# Patient Record
Sex: Male | Born: 1950 | Race: White | Hispanic: No | Marital: Married | Smoking: Former smoker
Health system: Southern US, Community
[De-identification: ages and names within clinical notes are randomized; demographics above are authoritative.]

## PROBLEM LIST (undated history)

## (undated) DIAGNOSIS — E119 Type 2 diabetes mellitus without complications: Secondary | ICD-10-CM

## (undated) DIAGNOSIS — F0390 Unspecified dementia without behavioral disturbance: Secondary | ICD-10-CM

## (undated) DIAGNOSIS — I10 Essential (primary) hypertension: Secondary | ICD-10-CM

## (undated) HISTORY — PX: CATARACT EXTRACTION: SUR2

## (undated) HISTORY — PX: ROTATOR CUFF REPAIR: SHX139

## (undated) HISTORY — PX: COLONOSCOPY: SHX174

## (undated) HISTORY — DX: Type 2 diabetes mellitus without complications: E11.9

## (undated) HISTORY — PX: EYE SURGERY: SHX253

## (undated) HISTORY — DX: Essential (primary) hypertension: I10

## (undated) HISTORY — PX: HEMORROIDECTOMY: SUR656

---

## 2013-03-17 DIAGNOSIS — G4700 Insomnia, unspecified: Secondary | ICD-10-CM | POA: Insufficient documentation

## 2013-05-23 DIAGNOSIS — M1990 Unspecified osteoarthritis, unspecified site: Secondary | ICD-10-CM | POA: Insufficient documentation

## 2013-12-03 DIAGNOSIS — E785 Hyperlipidemia, unspecified: Secondary | ICD-10-CM | POA: Insufficient documentation

## 2013-12-03 DIAGNOSIS — I10 Essential (primary) hypertension: Secondary | ICD-10-CM | POA: Insufficient documentation

## 2013-12-03 DIAGNOSIS — E10649 Type 1 diabetes mellitus with hypoglycemia without coma: Secondary | ICD-10-CM | POA: Insufficient documentation

## 2014-11-06 DIAGNOSIS — E1142 Type 2 diabetes mellitus with diabetic polyneuropathy: Secondary | ICD-10-CM | POA: Insufficient documentation

## 2014-11-06 DIAGNOSIS — R419 Unspecified symptoms and signs involving cognitive functions and awareness: Secondary | ICD-10-CM | POA: Insufficient documentation

## 2015-11-12 DIAGNOSIS — F5101 Primary insomnia: Secondary | ICD-10-CM | POA: Diagnosis not present

## 2015-11-12 DIAGNOSIS — G4733 Obstructive sleep apnea (adult) (pediatric): Secondary | ICD-10-CM | POA: Diagnosis not present

## 2015-11-12 DIAGNOSIS — Z6834 Body mass index (BMI) 34.0-34.9, adult: Secondary | ICD-10-CM | POA: Diagnosis not present

## 2015-11-12 DIAGNOSIS — R419 Unspecified symptoms and signs involving cognitive functions and awareness: Secondary | ICD-10-CM | POA: Diagnosis not present

## 2015-11-12 DIAGNOSIS — E1142 Type 2 diabetes mellitus with diabetic polyneuropathy: Secondary | ICD-10-CM | POA: Diagnosis not present

## 2015-12-22 DIAGNOSIS — E1142 Type 2 diabetes mellitus with diabetic polyneuropathy: Secondary | ICD-10-CM | POA: Diagnosis not present

## 2015-12-22 DIAGNOSIS — I10 Essential (primary) hypertension: Secondary | ICD-10-CM | POA: Diagnosis not present

## 2015-12-22 DIAGNOSIS — E785 Hyperlipidemia, unspecified: Secondary | ICD-10-CM | POA: Diagnosis not present

## 2015-12-22 DIAGNOSIS — E109 Type 1 diabetes mellitus without complications: Secondary | ICD-10-CM | POA: Diagnosis not present

## 2015-12-22 DIAGNOSIS — Z6833 Body mass index (BMI) 33.0-33.9, adult: Secondary | ICD-10-CM | POA: Diagnosis not present

## 2015-12-23 DIAGNOSIS — E785 Hyperlipidemia, unspecified: Secondary | ICD-10-CM | POA: Diagnosis not present

## 2015-12-23 DIAGNOSIS — I10 Essential (primary) hypertension: Secondary | ICD-10-CM | POA: Diagnosis not present

## 2015-12-23 DIAGNOSIS — E109 Type 1 diabetes mellitus without complications: Secondary | ICD-10-CM | POA: Diagnosis not present

## 2016-01-20 DIAGNOSIS — J4520 Mild intermittent asthma, uncomplicated: Secondary | ICD-10-CM | POA: Diagnosis not present

## 2016-01-20 DIAGNOSIS — Z6833 Body mass index (BMI) 33.0-33.9, adult: Secondary | ICD-10-CM | POA: Diagnosis not present

## 2016-01-20 DIAGNOSIS — J301 Allergic rhinitis due to pollen: Secondary | ICD-10-CM | POA: Insufficient documentation

## 2016-02-01 DIAGNOSIS — E161 Other hypoglycemia: Secondary | ICD-10-CM | POA: Diagnosis not present

## 2016-02-01 DIAGNOSIS — E162 Hypoglycemia, unspecified: Secondary | ICD-10-CM | POA: Diagnosis not present

## 2016-03-23 DIAGNOSIS — R609 Edema, unspecified: Secondary | ICD-10-CM | POA: Diagnosis not present

## 2016-03-23 DIAGNOSIS — Z6834 Body mass index (BMI) 34.0-34.9, adult: Secondary | ICD-10-CM | POA: Diagnosis not present

## 2016-04-08 DIAGNOSIS — G4733 Obstructive sleep apnea (adult) (pediatric): Secondary | ICD-10-CM | POA: Diagnosis not present

## 2016-05-08 DIAGNOSIS — G4733 Obstructive sleep apnea (adult) (pediatric): Secondary | ICD-10-CM | POA: Diagnosis not present

## 2016-05-12 DIAGNOSIS — Z6834 Body mass index (BMI) 34.0-34.9, adult: Secondary | ICD-10-CM | POA: Diagnosis not present

## 2016-05-12 DIAGNOSIS — E1142 Type 2 diabetes mellitus with diabetic polyneuropathy: Secondary | ICD-10-CM | POA: Diagnosis not present

## 2016-05-12 DIAGNOSIS — R419 Unspecified symptoms and signs involving cognitive functions and awareness: Secondary | ICD-10-CM | POA: Diagnosis not present

## 2016-05-12 DIAGNOSIS — G4733 Obstructive sleep apnea (adult) (pediatric): Secondary | ICD-10-CM | POA: Diagnosis not present

## 2016-05-12 DIAGNOSIS — F5101 Primary insomnia: Secondary | ICD-10-CM | POA: Diagnosis not present

## 2016-05-12 DIAGNOSIS — R4781 Slurred speech: Secondary | ICD-10-CM | POA: Diagnosis not present

## 2016-06-01 DIAGNOSIS — L570 Actinic keratosis: Secondary | ICD-10-CM | POA: Diagnosis not present

## 2016-06-01 DIAGNOSIS — L821 Other seborrheic keratosis: Secondary | ICD-10-CM | POA: Diagnosis not present

## 2016-06-01 DIAGNOSIS — Z85828 Personal history of other malignant neoplasm of skin: Secondary | ICD-10-CM | POA: Diagnosis not present

## 2016-06-08 DIAGNOSIS — G4733 Obstructive sleep apnea (adult) (pediatric): Secondary | ICD-10-CM | POA: Diagnosis not present

## 2016-06-28 DIAGNOSIS — E785 Hyperlipidemia, unspecified: Secondary | ICD-10-CM | POA: Diagnosis not present

## 2016-06-28 DIAGNOSIS — I10 Essential (primary) hypertension: Secondary | ICD-10-CM | POA: Diagnosis not present

## 2016-06-28 DIAGNOSIS — R609 Edema, unspecified: Secondary | ICD-10-CM | POA: Diagnosis not present

## 2016-06-28 DIAGNOSIS — Z1159 Encounter for screening for other viral diseases: Secondary | ICD-10-CM | POA: Diagnosis not present

## 2016-06-28 DIAGNOSIS — Z6834 Body mass index (BMI) 34.0-34.9, adult: Secondary | ICD-10-CM | POA: Diagnosis not present

## 2016-06-28 DIAGNOSIS — E109 Type 1 diabetes mellitus without complications: Secondary | ICD-10-CM | POA: Diagnosis not present

## 2016-06-28 DIAGNOSIS — Z136 Encounter for screening for cardiovascular disorders: Secondary | ICD-10-CM | POA: Diagnosis not present

## 2016-07-09 DIAGNOSIS — G4733 Obstructive sleep apnea (adult) (pediatric): Secondary | ICD-10-CM | POA: Diagnosis not present

## 2016-08-08 DIAGNOSIS — G4733 Obstructive sleep apnea (adult) (pediatric): Secondary | ICD-10-CM | POA: Diagnosis not present

## 2016-09-08 DIAGNOSIS — G4733 Obstructive sleep apnea (adult) (pediatric): Secondary | ICD-10-CM | POA: Diagnosis not present

## 2016-10-08 DIAGNOSIS — G4733 Obstructive sleep apnea (adult) (pediatric): Secondary | ICD-10-CM | POA: Diagnosis not present

## 2016-11-03 DIAGNOSIS — E162 Hypoglycemia, unspecified: Secondary | ICD-10-CM | POA: Diagnosis not present

## 2016-11-03 DIAGNOSIS — E7800 Pure hypercholesterolemia, unspecified: Secondary | ICD-10-CM | POA: Diagnosis not present

## 2016-11-03 DIAGNOSIS — I10 Essential (primary) hypertension: Secondary | ICD-10-CM | POA: Diagnosis not present

## 2016-11-03 DIAGNOSIS — E109 Type 1 diabetes mellitus without complications: Secondary | ICD-10-CM | POA: Diagnosis not present

## 2016-11-08 DIAGNOSIS — G4733 Obstructive sleep apnea (adult) (pediatric): Secondary | ICD-10-CM | POA: Diagnosis not present

## 2016-11-10 DIAGNOSIS — R269 Unspecified abnormalities of gait and mobility: Secondary | ICD-10-CM | POA: Diagnosis not present

## 2016-11-10 DIAGNOSIS — Z6833 Body mass index (BMI) 33.0-33.9, adult: Secondary | ICD-10-CM | POA: Diagnosis not present

## 2016-11-10 DIAGNOSIS — F5101 Primary insomnia: Secondary | ICD-10-CM | POA: Diagnosis not present

## 2016-11-10 DIAGNOSIS — R419 Unspecified symptoms and signs involving cognitive functions and awareness: Secondary | ICD-10-CM | POA: Diagnosis not present

## 2016-11-10 DIAGNOSIS — E1142 Type 2 diabetes mellitus with diabetic polyneuropathy: Secondary | ICD-10-CM | POA: Diagnosis not present

## 2016-11-15 DIAGNOSIS — E10649 Type 1 diabetes mellitus with hypoglycemia without coma: Secondary | ICD-10-CM | POA: Diagnosis not present

## 2016-11-15 DIAGNOSIS — Z6833 Body mass index (BMI) 33.0-33.9, adult: Secondary | ICD-10-CM | POA: Diagnosis not present

## 2016-11-18 DIAGNOSIS — E1142 Type 2 diabetes mellitus with diabetic polyneuropathy: Secondary | ICD-10-CM | POA: Diagnosis not present

## 2016-11-18 DIAGNOSIS — E10649 Type 1 diabetes mellitus with hypoglycemia without coma: Secondary | ICD-10-CM | POA: Diagnosis not present

## 2016-11-18 DIAGNOSIS — E10641 Type 1 diabetes mellitus with hypoglycemia with coma: Secondary | ICD-10-CM | POA: Diagnosis not present

## 2016-12-08 DIAGNOSIS — G4733 Obstructive sleep apnea (adult) (pediatric): Secondary | ICD-10-CM | POA: Diagnosis not present

## 2016-12-20 DIAGNOSIS — E1142 Type 2 diabetes mellitus with diabetic polyneuropathy: Secondary | ICD-10-CM | POA: Diagnosis not present

## 2016-12-20 DIAGNOSIS — E10649 Type 1 diabetes mellitus with hypoglycemia without coma: Secondary | ICD-10-CM | POA: Diagnosis not present

## 2017-01-06 DIAGNOSIS — G4733 Obstructive sleep apnea (adult) (pediatric): Secondary | ICD-10-CM | POA: Diagnosis not present

## 2017-01-12 DIAGNOSIS — Z0000 Encounter for general adult medical examination without abnormal findings: Secondary | ICD-10-CM | POA: Diagnosis not present

## 2017-01-27 DIAGNOSIS — L821 Other seborrheic keratosis: Secondary | ICD-10-CM | POA: Diagnosis not present

## 2017-01-27 DIAGNOSIS — L570 Actinic keratosis: Secondary | ICD-10-CM | POA: Diagnosis not present

## 2017-02-04 DIAGNOSIS — J069 Acute upper respiratory infection, unspecified: Secondary | ICD-10-CM | POA: Diagnosis not present

## 2017-02-04 DIAGNOSIS — R05 Cough: Secondary | ICD-10-CM | POA: Diagnosis not present

## 2017-02-16 DIAGNOSIS — G4733 Obstructive sleep apnea (adult) (pediatric): Secondary | ICD-10-CM | POA: Diagnosis not present

## 2017-02-18 DIAGNOSIS — G4733 Obstructive sleep apnea (adult) (pediatric): Secondary | ICD-10-CM | POA: Diagnosis not present

## 2017-03-03 DIAGNOSIS — E10649 Type 1 diabetes mellitus with hypoglycemia without coma: Secondary | ICD-10-CM | POA: Diagnosis not present

## 2017-03-03 DIAGNOSIS — E10641 Type 1 diabetes mellitus with hypoglycemia with coma: Secondary | ICD-10-CM | POA: Diagnosis not present

## 2017-03-03 DIAGNOSIS — E1142 Type 2 diabetes mellitus with diabetic polyneuropathy: Secondary | ICD-10-CM | POA: Diagnosis not present

## 2017-03-03 DIAGNOSIS — E161 Other hypoglycemia: Secondary | ICD-10-CM | POA: Diagnosis not present

## 2017-03-03 DIAGNOSIS — E162 Hypoglycemia, unspecified: Secondary | ICD-10-CM | POA: Diagnosis not present

## 2017-04-29 DIAGNOSIS — E10649 Type 1 diabetes mellitus with hypoglycemia without coma: Secondary | ICD-10-CM | POA: Diagnosis not present

## 2017-05-06 DIAGNOSIS — E10649 Type 1 diabetes mellitus with hypoglycemia without coma: Secondary | ICD-10-CM | POA: Diagnosis not present

## 2017-05-13 DIAGNOSIS — J0100 Acute maxillary sinusitis, unspecified: Secondary | ICD-10-CM | POA: Diagnosis not present

## 2017-05-13 DIAGNOSIS — J029 Acute pharyngitis, unspecified: Secondary | ICD-10-CM | POA: Diagnosis not present

## 2017-05-21 DIAGNOSIS — R4182 Altered mental status, unspecified: Secondary | ICD-10-CM | POA: Diagnosis not present

## 2017-05-21 DIAGNOSIS — S0181XA Laceration without foreign body of other part of head, initial encounter: Secondary | ICD-10-CM | POA: Diagnosis not present

## 2017-05-21 DIAGNOSIS — S0990XA Unspecified injury of head, initial encounter: Secondary | ICD-10-CM | POA: Diagnosis not present

## 2017-05-21 DIAGNOSIS — E11649 Type 2 diabetes mellitus with hypoglycemia without coma: Secondary | ICD-10-CM | POA: Diagnosis not present

## 2017-05-21 DIAGNOSIS — S0101XA Laceration without foreign body of scalp, initial encounter: Secondary | ICD-10-CM | POA: Diagnosis not present

## 2017-05-30 DIAGNOSIS — E10649 Type 1 diabetes mellitus with hypoglycemia without coma: Secondary | ICD-10-CM | POA: Diagnosis not present

## 2017-05-31 DIAGNOSIS — Z4802 Encounter for removal of sutures: Secondary | ICD-10-CM | POA: Diagnosis not present

## 2017-05-31 DIAGNOSIS — S0101XD Laceration without foreign body of scalp, subsequent encounter: Secondary | ICD-10-CM | POA: Diagnosis not present

## 2017-06-30 DIAGNOSIS — E10649 Type 1 diabetes mellitus with hypoglycemia without coma: Secondary | ICD-10-CM | POA: Diagnosis not present

## 2017-07-04 DIAGNOSIS — Z23 Encounter for immunization: Secondary | ICD-10-CM | POA: Diagnosis not present

## 2017-07-04 DIAGNOSIS — E785 Hyperlipidemia, unspecified: Secondary | ICD-10-CM | POA: Diagnosis not present

## 2017-07-04 DIAGNOSIS — I10 Essential (primary) hypertension: Secondary | ICD-10-CM | POA: Diagnosis not present

## 2017-07-04 DIAGNOSIS — E109 Type 1 diabetes mellitus without complications: Secondary | ICD-10-CM | POA: Diagnosis not present

## 2017-07-12 DIAGNOSIS — L821 Other seborrheic keratosis: Secondary | ICD-10-CM | POA: Diagnosis not present

## 2017-07-12 DIAGNOSIS — Z85828 Personal history of other malignant neoplasm of skin: Secondary | ICD-10-CM | POA: Diagnosis not present

## 2017-08-01 DIAGNOSIS — E10649 Type 1 diabetes mellitus with hypoglycemia without coma: Secondary | ICD-10-CM | POA: Diagnosis not present

## 2017-08-23 DIAGNOSIS — R400 Somnolence: Secondary | ICD-10-CM | POA: Diagnosis not present

## 2017-09-09 DIAGNOSIS — E10649 Type 1 diabetes mellitus with hypoglycemia without coma: Secondary | ICD-10-CM | POA: Diagnosis not present

## 2017-09-22 DIAGNOSIS — E1065 Type 1 diabetes mellitus with hyperglycemia: Secondary | ICD-10-CM | POA: Diagnosis not present

## 2017-09-26 DIAGNOSIS — Z86010 Personal history of colonic polyps: Secondary | ICD-10-CM | POA: Diagnosis not present

## 2017-09-26 DIAGNOSIS — D123 Benign neoplasm of transverse colon: Secondary | ICD-10-CM | POA: Diagnosis not present

## 2017-09-26 DIAGNOSIS — K5730 Diverticulosis of large intestine without perforation or abscess without bleeding: Secondary | ICD-10-CM | POA: Diagnosis not present

## 2017-10-10 DIAGNOSIS — E10649 Type 1 diabetes mellitus with hypoglycemia without coma: Secondary | ICD-10-CM | POA: Diagnosis not present

## 2017-11-21 DIAGNOSIS — E10649 Type 1 diabetes mellitus with hypoglycemia without coma: Secondary | ICD-10-CM | POA: Diagnosis not present

## 2018-01-02 DIAGNOSIS — G4700 Insomnia, unspecified: Secondary | ICD-10-CM | POA: Diagnosis not present

## 2018-01-02 DIAGNOSIS — E785 Hyperlipidemia, unspecified: Secondary | ICD-10-CM | POA: Diagnosis not present

## 2018-01-02 DIAGNOSIS — E10649 Type 1 diabetes mellitus with hypoglycemia without coma: Secondary | ICD-10-CM | POA: Diagnosis not present

## 2018-01-02 DIAGNOSIS — I10 Essential (primary) hypertension: Secondary | ICD-10-CM | POA: Diagnosis not present

## 2018-01-10 DIAGNOSIS — E1065 Type 1 diabetes mellitus with hyperglycemia: Secondary | ICD-10-CM | POA: Diagnosis not present

## 2018-01-10 DIAGNOSIS — E11649 Type 2 diabetes mellitus with hypoglycemia without coma: Secondary | ICD-10-CM | POA: Diagnosis not present

## 2018-01-23 DIAGNOSIS — E10649 Type 1 diabetes mellitus with hypoglycemia without coma: Secondary | ICD-10-CM | POA: Diagnosis not present

## 2018-01-30 DIAGNOSIS — E10649 Type 1 diabetes mellitus with hypoglycemia without coma: Secondary | ICD-10-CM | POA: Diagnosis not present

## 2018-01-30 DIAGNOSIS — R600 Localized edema: Secondary | ICD-10-CM | POA: Diagnosis not present

## 2018-01-30 DIAGNOSIS — E669 Obesity, unspecified: Secondary | ICD-10-CM | POA: Diagnosis not present

## 2018-02-06 DIAGNOSIS — R600 Localized edema: Secondary | ICD-10-CM | POA: Insufficient documentation

## 2018-02-06 DIAGNOSIS — E66811 Obesity, class 1: Secondary | ICD-10-CM | POA: Insufficient documentation

## 2018-02-06 DIAGNOSIS — E669 Obesity, unspecified: Secondary | ICD-10-CM | POA: Insufficient documentation

## 2018-02-09 DIAGNOSIS — E11649 Type 2 diabetes mellitus with hypoglycemia without coma: Secondary | ICD-10-CM | POA: Diagnosis not present

## 2018-02-09 DIAGNOSIS — E1065 Type 1 diabetes mellitus with hyperglycemia: Secondary | ICD-10-CM | POA: Diagnosis not present

## 2018-02-22 DIAGNOSIS — E10649 Type 1 diabetes mellitus with hypoglycemia without coma: Secondary | ICD-10-CM | POA: Diagnosis not present

## 2018-02-28 DIAGNOSIS — L723 Sebaceous cyst: Secondary | ICD-10-CM | POA: Insufficient documentation

## 2018-03-01 DIAGNOSIS — R419 Unspecified symptoms and signs involving cognitive functions and awareness: Secondary | ICD-10-CM | POA: Diagnosis not present

## 2018-03-01 DIAGNOSIS — R269 Unspecified abnormalities of gait and mobility: Secondary | ICD-10-CM | POA: Insufficient documentation

## 2018-03-01 DIAGNOSIS — E1142 Type 2 diabetes mellitus with diabetic polyneuropathy: Secondary | ICD-10-CM | POA: Diagnosis not present

## 2018-03-01 DIAGNOSIS — F5101 Primary insomnia: Secondary | ICD-10-CM | POA: Diagnosis not present

## 2018-03-13 DIAGNOSIS — E10649 Type 1 diabetes mellitus with hypoglycemia without coma: Secondary | ICD-10-CM | POA: Diagnosis not present

## 2018-03-13 DIAGNOSIS — E1065 Type 1 diabetes mellitus with hyperglycemia: Secondary | ICD-10-CM | POA: Diagnosis not present

## 2018-03-16 DIAGNOSIS — D044 Carcinoma in situ of skin of scalp and neck: Secondary | ICD-10-CM | POA: Diagnosis not present

## 2018-03-16 DIAGNOSIS — L821 Other seborrheic keratosis: Secondary | ICD-10-CM | POA: Diagnosis not present

## 2018-03-16 DIAGNOSIS — D485 Neoplasm of uncertain behavior of skin: Secondary | ICD-10-CM | POA: Diagnosis not present

## 2018-03-27 DIAGNOSIS — E10649 Type 1 diabetes mellitus with hypoglycemia without coma: Secondary | ICD-10-CM | POA: Diagnosis not present

## 2018-04-05 DIAGNOSIS — E669 Obesity, unspecified: Secondary | ICD-10-CM | POA: Diagnosis not present

## 2018-04-05 DIAGNOSIS — E10649 Type 1 diabetes mellitus with hypoglycemia without coma: Secondary | ICD-10-CM | POA: Diagnosis not present

## 2018-04-10 DIAGNOSIS — Z01818 Encounter for other preprocedural examination: Secondary | ICD-10-CM | POA: Diagnosis not present

## 2018-04-10 DIAGNOSIS — E109 Type 1 diabetes mellitus without complications: Secondary | ICD-10-CM | POA: Diagnosis not present

## 2018-04-10 DIAGNOSIS — H2512 Age-related nuclear cataract, left eye: Secondary | ICD-10-CM | POA: Diagnosis not present

## 2018-04-19 DIAGNOSIS — G4733 Obstructive sleep apnea (adult) (pediatric): Secondary | ICD-10-CM | POA: Diagnosis not present

## 2018-04-19 DIAGNOSIS — F5101 Primary insomnia: Secondary | ICD-10-CM | POA: Diagnosis not present

## 2018-04-19 DIAGNOSIS — G479 Sleep disorder, unspecified: Secondary | ICD-10-CM | POA: Diagnosis not present

## 2018-04-26 DIAGNOSIS — E10649 Type 1 diabetes mellitus with hypoglycemia without coma: Secondary | ICD-10-CM | POA: Diagnosis not present

## 2018-04-28 DIAGNOSIS — H2512 Age-related nuclear cataract, left eye: Secondary | ICD-10-CM | POA: Diagnosis not present

## 2018-04-28 DIAGNOSIS — H25812 Combined forms of age-related cataract, left eye: Secondary | ICD-10-CM | POA: Diagnosis not present

## 2018-05-10 DIAGNOSIS — E10649 Type 1 diabetes mellitus with hypoglycemia without coma: Secondary | ICD-10-CM | POA: Diagnosis not present

## 2018-05-10 DIAGNOSIS — E669 Obesity, unspecified: Secondary | ICD-10-CM | POA: Diagnosis not present

## 2018-05-16 DIAGNOSIS — L298 Other pruritus: Secondary | ICD-10-CM | POA: Diagnosis not present

## 2018-06-05 DIAGNOSIS — Z6834 Body mass index (BMI) 34.0-34.9, adult: Secondary | ICD-10-CM | POA: Diagnosis not present

## 2018-06-05 DIAGNOSIS — Z87891 Personal history of nicotine dependence: Secondary | ICD-10-CM | POA: Diagnosis not present

## 2018-06-05 DIAGNOSIS — E10649 Type 1 diabetes mellitus with hypoglycemia without coma: Secondary | ICD-10-CM | POA: Diagnosis not present

## 2018-06-05 DIAGNOSIS — F1290 Cannabis use, unspecified, uncomplicated: Secondary | ICD-10-CM | POA: Diagnosis not present

## 2018-06-05 DIAGNOSIS — Z794 Long term (current) use of insulin: Secondary | ICD-10-CM | POA: Diagnosis not present

## 2018-06-05 DIAGNOSIS — G4733 Obstructive sleep apnea (adult) (pediatric): Secondary | ICD-10-CM | POA: Diagnosis not present

## 2018-06-05 DIAGNOSIS — R600 Localized edema: Secondary | ICD-10-CM | POA: Diagnosis not present

## 2018-06-05 DIAGNOSIS — E669 Obesity, unspecified: Secondary | ICD-10-CM | POA: Diagnosis not present

## 2018-06-05 DIAGNOSIS — Z7289 Other problems related to lifestyle: Secondary | ICD-10-CM | POA: Diagnosis not present

## 2018-06-06 DIAGNOSIS — E10649 Type 1 diabetes mellitus with hypoglycemia without coma: Secondary | ICD-10-CM | POA: Diagnosis not present

## 2018-06-26 DIAGNOSIS — E10649 Type 1 diabetes mellitus with hypoglycemia without coma: Secondary | ICD-10-CM | POA: Diagnosis not present

## 2018-07-07 DIAGNOSIS — E10649 Type 1 diabetes mellitus with hypoglycemia without coma: Secondary | ICD-10-CM | POA: Diagnosis not present

## 2018-08-16 DIAGNOSIS — E10649 Type 1 diabetes mellitus with hypoglycemia without coma: Secondary | ICD-10-CM | POA: Diagnosis not present

## 2018-09-15 DIAGNOSIS — E10649 Type 1 diabetes mellitus with hypoglycemia without coma: Secondary | ICD-10-CM | POA: Diagnosis not present

## 2018-09-19 DIAGNOSIS — C4441 Basal cell carcinoma of skin of scalp and neck: Secondary | ICD-10-CM | POA: Diagnosis not present

## 2018-09-19 DIAGNOSIS — C44319 Basal cell carcinoma of skin of other parts of face: Secondary | ICD-10-CM | POA: Diagnosis not present

## 2018-09-19 DIAGNOSIS — E785 Hyperlipidemia, unspecified: Secondary | ICD-10-CM | POA: Diagnosis not present

## 2018-09-19 DIAGNOSIS — I10 Essential (primary) hypertension: Secondary | ICD-10-CM | POA: Diagnosis not present

## 2018-09-19 DIAGNOSIS — Z85828 Personal history of other malignant neoplasm of skin: Secondary | ICD-10-CM | POA: Diagnosis not present

## 2018-09-19 DIAGNOSIS — E10649 Type 1 diabetes mellitus with hypoglycemia without coma: Secondary | ICD-10-CM | POA: Diagnosis not present

## 2018-10-09 DIAGNOSIS — H40013 Open angle with borderline findings, low risk, bilateral: Secondary | ICD-10-CM | POA: Diagnosis not present

## 2018-10-18 DIAGNOSIS — E10649 Type 1 diabetes mellitus with hypoglycemia without coma: Secondary | ICD-10-CM | POA: Diagnosis not present

## 2018-10-30 DIAGNOSIS — R269 Unspecified abnormalities of gait and mobility: Secondary | ICD-10-CM | POA: Diagnosis not present

## 2018-10-30 DIAGNOSIS — R419 Unspecified symptoms and signs involving cognitive functions and awareness: Secondary | ICD-10-CM | POA: Diagnosis not present

## 2018-10-30 DIAGNOSIS — E1142 Type 2 diabetes mellitus with diabetic polyneuropathy: Secondary | ICD-10-CM | POA: Diagnosis not present

## 2018-10-30 DIAGNOSIS — F5101 Primary insomnia: Secondary | ICD-10-CM | POA: Diagnosis not present

## 2018-11-01 DIAGNOSIS — R0609 Other forms of dyspnea: Secondary | ICD-10-CM | POA: Diagnosis not present

## 2018-11-01 DIAGNOSIS — R0602 Shortness of breath: Secondary | ICD-10-CM | POA: Insufficient documentation

## 2018-11-01 DIAGNOSIS — I252 Old myocardial infarction: Secondary | ICD-10-CM | POA: Insufficient documentation

## 2018-11-01 DIAGNOSIS — I10 Essential (primary) hypertension: Secondary | ICD-10-CM | POA: Diagnosis not present

## 2018-11-01 DIAGNOSIS — I214 Non-ST elevation (NSTEMI) myocardial infarction: Secondary | ICD-10-CM | POA: Insufficient documentation

## 2018-11-01 DIAGNOSIS — I2510 Atherosclerotic heart disease of native coronary artery without angina pectoris: Secondary | ICD-10-CM | POA: Diagnosis not present

## 2018-11-01 DIAGNOSIS — E7800 Pure hypercholesterolemia, unspecified: Secondary | ICD-10-CM | POA: Diagnosis not present

## 2018-11-06 DIAGNOSIS — E1065 Type 1 diabetes mellitus with hyperglycemia: Secondary | ICD-10-CM | POA: Diagnosis not present

## 2018-11-08 DIAGNOSIS — E10649 Type 1 diabetes mellitus with hypoglycemia without coma: Secondary | ICD-10-CM | POA: Diagnosis not present

## 2018-11-08 DIAGNOSIS — E1065 Type 1 diabetes mellitus with hyperglycemia: Secondary | ICD-10-CM | POA: Diagnosis not present

## 2018-11-09 DIAGNOSIS — I517 Cardiomegaly: Secondary | ICD-10-CM | POA: Diagnosis not present

## 2018-11-09 DIAGNOSIS — I340 Nonrheumatic mitral (valve) insufficiency: Secondary | ICD-10-CM | POA: Diagnosis not present

## 2018-11-17 DIAGNOSIS — E10649 Type 1 diabetes mellitus with hypoglycemia without coma: Secondary | ICD-10-CM | POA: Diagnosis not present

## 2018-12-18 DIAGNOSIS — E10649 Type 1 diabetes mellitus with hypoglycemia without coma: Secondary | ICD-10-CM | POA: Diagnosis not present

## 2018-12-25 DIAGNOSIS — G4733 Obstructive sleep apnea (adult) (pediatric): Secondary | ICD-10-CM | POA: Diagnosis not present

## 2018-12-25 DIAGNOSIS — E1142 Type 2 diabetes mellitus with diabetic polyneuropathy: Secondary | ICD-10-CM | POA: Diagnosis not present

## 2018-12-25 DIAGNOSIS — F5101 Primary insomnia: Secondary | ICD-10-CM | POA: Diagnosis not present

## 2018-12-25 DIAGNOSIS — R419 Unspecified symptoms and signs involving cognitive functions and awareness: Secondary | ICD-10-CM | POA: Diagnosis not present

## 2019-01-02 ENCOUNTER — Other Ambulatory Visit: Payer: Self-pay | Admitting: *Deleted

## 2019-01-02 ENCOUNTER — Encounter: Payer: Self-pay | Admitting: *Deleted

## 2019-01-02 DIAGNOSIS — E10649 Type 1 diabetes mellitus with hypoglycemia without coma: Secondary | ICD-10-CM

## 2019-01-02 DIAGNOSIS — R600 Localized edema: Secondary | ICD-10-CM

## 2019-01-02 DIAGNOSIS — I10 Essential (primary) hypertension: Secondary | ICD-10-CM

## 2019-01-02 DIAGNOSIS — E785 Hyperlipidemia, unspecified: Secondary | ICD-10-CM

## 2019-01-02 NOTE — Patient Outreach (Addendum)
  [facility]) Care Management Chronic Special Needs Program  01/02/2019  Name: LYDELL MOGA DOB: 1951-09-20  MRN: [id number]  Mr. Apollos Tenbrink is enrolled in a chronic special needs plan for  Diabetes. A completed health risk assessment has not been received from the client and client has not responded to outreach attempts by their health care concierge.  The client's individualized care plan was developed based on available data.  Plan:  . Send unsuccessful outreach letter with a copy of individualized care plan to client . Send Advance Care packet to client . Send Neurosurgeon on self management of heart disease, HTN and OSA to client . Refer client to [place] Management pharmacist related to polypharmacy . Send individualized care plan to provider  Chronic care management coordinator, Peter Garter, will attempt outreach in 2-4 months.    Barrington Ellison RN,CCM,CDE [place] Management Coordinator Office Phone [phone] Office Fax [phone]

## 2019-01-09 ENCOUNTER — Other Ambulatory Visit: Payer: Self-pay | Admitting: Pharmacist

## 2019-01-09 NOTE — Patient Outreach (Signed)
[place] [facility]) Care Management  [place]   01/09/2019  NIV DARLEY August 15, 1951 [id number]  Reason for referral: Medication Review  Referral source: [place] Management RN with [employer] C-SNP Current insurance: [employer] C-SNP  PMHx includes but not limited to:  CAD, obesity, allergic rhinitis, T1DM with peripheral neuopathy, HTN, HLD, OA  Outreach:  Successful telephone call with [name].  HIPAA identifiers verified.   Subjective:  Patient agreeable to review medications.  He denies having any issues paying for medications or having any concerns at this time.    Objective: No results found for: CREATININE  No results found for: HGBA1C  Lipid Panel  No results found for: CHOL, TRIG, HDL, CHOLHDL, VLDL, LDLCALC, LDLDIRECT  BP Readings from Last 3 Encounters:  No data found for BP    Allergies  Allergen Reactions  . Iodinated Diagnostic Agents Hives    Medications Reviewed Today    Reviewed by Rudean Haskell, RPH (Pharmacist) on 01/09/19 at 1527  Med List Status: <None>  Medication Order Taking? Sig Documenting Provider Last Dose Status Informant  aspirin EC 81 MG tablet [id number] Yes Take by mouth. [provider] Taking Active   Blood Glucose Monitoring Suppl (FIFTY50 GLUCOSE METER 2.0) w/Device KIT [id number] Yes Use as instructed [provider] Taking Active   carvedilol (COREG) 6.25 MG tablet [id number] Yes Take by mouth 2 (two) times daily with a meal.  [provider] Taking Active   cetirizine (ZYRTEC) 10 MG tablet [id number] Yes Take 10 mg by mouth daily.  [provider] Taking Active         Discontinued 01/09/19 1522 (Discontinued by provider)   fenofibrate micronized (ANTARA) 130 MG capsule [id number] Yes Take 1 tablet daily [provider] Taking Active   Flax Oil-Fish Oil-Borage Oil CAPS [id number] Yes Take 1,000 capsules by mouth daily.  [provider] Taking Active   FLUoxetine (PROZAC) 20 MG capsule [id number] Yes TAKE 1 CAPSULE BY MOUTH ONCE DAILY [provider] Taking Active   furosemide (LASIX) 20 MG tablet [id number] Yes Take 20 mg by mouth 2 (two) times daily. Takes 70m in the morning and 23min the afternoon [provider] Taking Active         Discontinued 01/09/19 1525 (Change in therapy)   Insulin Glargine, 1 Unit Dial, 300 UNIT/ML SOPN [id number] Inject 24 Units into the skin at bedtime. Lantus [provider] Taking Active   insulin regular (NOVOLIN R,HUMULIN R) 100 units/mL injection [id number] Inject 25 Units into the skin 3 (three) times daily before meals. [provider] Taking Active   Melatonin 3 MG CAPS [id number] Take 1 capsule by mouth at bedtime.  [provider] Taking Active   Multiple Vitamin (MULTI-VITAMINS) TABS [id number] Take 1 tablet by mouth daily.  [provider] Taking Active   traZODone (DESYREL) 50 MG tablet [id number] Take 50 mg by mouth at bedtime. [provider] Taking Active           Assessment:  Drugs sorted by system:  Neurologic/Psychologic: fluoxetine, trazodone  Cardiovascular: aspirin 8138mcarvedilol, fenofibrate, flax oil-fish-oil, furosemide  Pulmonary/Allergy: cetirizine  Endocrine:Lantus, Novolin R (relion brand)  Vitamins/Minerals/Supplements: MVI, melatonin  Medication Review Findings:  . No medication issues identified  Medication Assistance Findings:  No medication assistance needs identified  -Patient using Relion brand of insulin ($25/vial) as this is less expensive than using insurance and extends time out of coverage  gap.   -Patient checking blood sugars with continues blood glucose monitoring -Denies having issues paying for medications.    Plan: . Will close [facility] pharmacy case as no further medication needs identified at this time.  Am happy to assist in the future as needed.      Ralene Bathe, PharmD, [place] [phone]

## 2019-01-25 DIAGNOSIS — E10649 Type 1 diabetes mellitus with hypoglycemia without coma: Secondary | ICD-10-CM | POA: Diagnosis not present

## 2019-02-07 DIAGNOSIS — E7800 Pure hypercholesterolemia, unspecified: Secondary | ICD-10-CM | POA: Diagnosis not present

## 2019-02-07 DIAGNOSIS — I10 Essential (primary) hypertension: Secondary | ICD-10-CM | POA: Diagnosis not present

## 2019-02-07 DIAGNOSIS — I2510 Atherosclerotic heart disease of native coronary artery without angina pectoris: Secondary | ICD-10-CM | POA: Diagnosis not present

## 2019-02-07 DIAGNOSIS — I252 Old myocardial infarction: Secondary | ICD-10-CM | POA: Diagnosis not present

## 2019-02-08 DIAGNOSIS — I10 Essential (primary) hypertension: Secondary | ICD-10-CM | POA: Diagnosis not present

## 2019-02-08 DIAGNOSIS — Z6835 Body mass index (BMI) 35.0-35.9, adult: Secondary | ICD-10-CM | POA: Diagnosis not present

## 2019-02-08 DIAGNOSIS — E1065 Type 1 diabetes mellitus with hyperglycemia: Secondary | ICD-10-CM | POA: Diagnosis not present

## 2019-02-08 LAB — HEMOGLOBIN A1C: Hemoglobin A1C: 7.1

## 2019-02-20 ENCOUNTER — Ambulatory Visit: Payer: Self-pay

## 2019-02-26 DIAGNOSIS — E10649 Type 1 diabetes mellitus with hypoglycemia without coma: Secondary | ICD-10-CM | POA: Diagnosis not present

## 2019-02-27 ENCOUNTER — Other Ambulatory Visit: Payer: Self-pay

## 2019-02-27 NOTE — Patient Outreach (Signed)
  [place] [facility]) Care Management Chronic Special Needs Program  02/27/2019  Name: Jeremy Hickman DOB: [birth year]  MRN: [id number]  [name] is enrolled in a chronic special needs plan for Diabetes. Client answered but states he is busy and to call him back in 30 minutes  Client called back and no answer and HIPAA compliant message left. Plan for 2nd outreach call in one week if message not changed Chronic care management coordinator will attempt outreach in one week .   Peter Garter RN, Jack[place]uline [place], CDE Chronic Care Management Coordinator [place] [facility] Care Management [phone]

## 2019-02-28 ENCOUNTER — Ambulatory Visit: Payer: Self-pay

## 2019-02-28 ENCOUNTER — Other Ambulatory Visit: Payer: Self-pay

## 2019-02-28 DIAGNOSIS — E1065 Type 1 diabetes mellitus with hyperglycemia: Secondary | ICD-10-CM | POA: Diagnosis not present

## 2019-02-28 NOTE — Patient Outreach (Signed)
  [place] [facility]) Care Management Chronic Special Needs Program  02/28/2019  Name: Jeremy Hickman DOB: 1951-08-10  MRN: [id number]  Mr. Amay Mijangos is enrolled in a chronic special needs plan for Diabetes. Chronic Care Management Coordinator telephoned client to review health risk assessment and to develop individualized care plan.  Introduced the chronic care management program, importance of client participation, and taking their care plan to all provider appointments and inpatient facilities.  Reviewed the transition of care process and possible referral to community care management.  Subjective: Client states that he is healthy for someone who has had diabetes for 54 years.  States he is having fewer lower readings since he started using the Dexcom 6 continuous glucose monitor.  States he sees his endocrinologist and the diabetes nutrition specialist at her office regularly.  States he has not been exercising as much since the gym has been closed due to Covid 19.  Denies any difficulty affording his medications at this time.  Client states that he has Advanced Directives.  Goals Addressed            This Visit's Progress   . COMPLETED:  Acknowledge receipt of Programme researcher, broadcasting/film/video      . Client understands the importance of follow-up with providers by attending scheduled visits   On track   . Client verbalizes knowledge of Heart Attack and/or  CAD self management skills in next 4 months   On track   . Client will verbalize knowledge of self management of Hypertension as evidences by BP reading of 140/90 or less; or as defined by provider   On track   . HEMOGLOBIN A1C < 7.0       Diabetes self management actions:  Glucose monitoring per provider recommendations  Eat Healthy  Check feet daily  Visit provider every 3-6 months as directed  Hbg A1C level every 3-6 months.  Eye Exam yearly    . Maintain timely refills of diabetic medication as prescribed within  the year .   On track   . Obtain annual  Lipid Profile, LDL-C   On track   . Obtain Annual Eye (retinal)  Exam    On track   . Obtain Annual Foot Exam   On track   . Obtain annual screen for micro albuminuria (urine) , nephropathy (kidney problems)   On track   . Obtain Hemoglobin A1C at least 2 times per year   On track   . Visit Primary Care Provider or Endocrinologist at least 2 times per year    On track    Client is not meeting diabetes self management goal of hemoglobin A1C of <7% with last reading of 7.1%.  Client reports fewer episodes of hypoglycemia since he got his Dexcom 6. Client completed medication review with [employer] scientist (medical).  Discussed COVID19 cause, symptoms, precautions (social distancing, stay at home order, hand washing), confirmed client knows how to contact provider Plan:  Send successful outreach letter with a copy of their individualized care plan and Send individual care plan to provider  Chronic care management coordination will outreach in:  6 Months     Peter Garter RN, [facility], [place] Management Coordinator [place] Management [phone]

## 2019-03-01 DIAGNOSIS — I10 Essential (primary) hypertension: Secondary | ICD-10-CM | POA: Diagnosis not present

## 2019-03-01 DIAGNOSIS — E10649 Type 1 diabetes mellitus with hypoglycemia without coma: Secondary | ICD-10-CM | POA: Diagnosis not present

## 2019-03-01 DIAGNOSIS — E7800 Pure hypercholesterolemia, unspecified: Secondary | ICD-10-CM | POA: Diagnosis not present

## 2019-03-07 DIAGNOSIS — E10649 Type 1 diabetes mellitus with hypoglycemia without coma: Secondary | ICD-10-CM | POA: Diagnosis not present

## 2019-03-07 DIAGNOSIS — E785 Hyperlipidemia, unspecified: Secondary | ICD-10-CM | POA: Diagnosis not present

## 2019-03-07 DIAGNOSIS — E7800 Pure hypercholesterolemia, unspecified: Secondary | ICD-10-CM | POA: Diagnosis not present

## 2019-03-07 DIAGNOSIS — Z794 Long term (current) use of insulin: Secondary | ICD-10-CM | POA: Diagnosis not present

## 2019-03-29 DIAGNOSIS — E10649 Type 1 diabetes mellitus with hypoglycemia without coma: Secondary | ICD-10-CM | POA: Diagnosis not present

## 2019-04-02 DIAGNOSIS — H524 Presbyopia: Secondary | ICD-10-CM | POA: Diagnosis not present

## 2019-04-30 DIAGNOSIS — E10649 Type 1 diabetes mellitus with hypoglycemia without coma: Secondary | ICD-10-CM | POA: Diagnosis not present

## 2019-05-01 DIAGNOSIS — L03115 Cellulitis of right lower limb: Secondary | ICD-10-CM | POA: Diagnosis not present

## 2019-05-01 DIAGNOSIS — Z0000 Encounter for general adult medical examination without abnormal findings: Secondary | ICD-10-CM | POA: Diagnosis not present

## 2019-05-02 DIAGNOSIS — F909 Attention-deficit hyperactivity disorder, unspecified type: Secondary | ICD-10-CM | POA: Diagnosis not present

## 2019-05-02 DIAGNOSIS — F39 Unspecified mood [affective] disorder: Secondary | ICD-10-CM | POA: Diagnosis not present

## 2019-05-08 DIAGNOSIS — E7800 Pure hypercholesterolemia, unspecified: Secondary | ICD-10-CM | POA: Diagnosis not present

## 2019-05-08 DIAGNOSIS — J301 Allergic rhinitis due to pollen: Secondary | ICD-10-CM | POA: Diagnosis not present

## 2019-05-08 DIAGNOSIS — I10 Essential (primary) hypertension: Secondary | ICD-10-CM | POA: Diagnosis not present

## 2019-05-09 DIAGNOSIS — G479 Sleep disorder, unspecified: Secondary | ICD-10-CM | POA: Diagnosis not present

## 2019-05-09 DIAGNOSIS — E1142 Type 2 diabetes mellitus with diabetic polyneuropathy: Secondary | ICD-10-CM | POA: Diagnosis not present

## 2019-05-09 DIAGNOSIS — G4733 Obstructive sleep apnea (adult) (pediatric): Secondary | ICD-10-CM | POA: Diagnosis not present

## 2019-05-09 DIAGNOSIS — R419 Unspecified symptoms and signs involving cognitive functions and awareness: Secondary | ICD-10-CM | POA: Diagnosis not present

## 2019-05-30 DIAGNOSIS — F909 Attention-deficit hyperactivity disorder, unspecified type: Secondary | ICD-10-CM | POA: Diagnosis not present

## 2019-05-30 DIAGNOSIS — F39 Unspecified mood [affective] disorder: Secondary | ICD-10-CM | POA: Diagnosis not present

## 2019-05-31 DIAGNOSIS — E10649 Type 1 diabetes mellitus with hypoglycemia without coma: Secondary | ICD-10-CM | POA: Diagnosis not present

## 2019-06-11 DIAGNOSIS — I10 Essential (primary) hypertension: Secondary | ICD-10-CM | POA: Diagnosis not present

## 2019-06-11 DIAGNOSIS — E7800 Pure hypercholesterolemia, unspecified: Secondary | ICD-10-CM | POA: Diagnosis not present

## 2019-06-11 DIAGNOSIS — E1065 Type 1 diabetes mellitus with hyperglycemia: Secondary | ICD-10-CM | POA: Diagnosis not present

## 2019-06-13 DIAGNOSIS — E1065 Type 1 diabetes mellitus with hyperglycemia: Secondary | ICD-10-CM | POA: Diagnosis not present

## 2019-06-25 DIAGNOSIS — R600 Localized edema: Secondary | ICD-10-CM | POA: Diagnosis not present

## 2019-06-25 DIAGNOSIS — R5383 Other fatigue: Secondary | ICD-10-CM | POA: Diagnosis not present

## 2019-07-02 DIAGNOSIS — E10649 Type 1 diabetes mellitus with hypoglycemia without coma: Secondary | ICD-10-CM | POA: Diagnosis not present

## 2019-07-13 DIAGNOSIS — E10649 Type 1 diabetes mellitus with hypoglycemia without coma: Secondary | ICD-10-CM | POA: Diagnosis not present

## 2019-07-13 DIAGNOSIS — E1065 Type 1 diabetes mellitus with hyperglycemia: Secondary | ICD-10-CM | POA: Diagnosis not present

## 2019-07-31 ENCOUNTER — Other Ambulatory Visit: Payer: Self-pay

## 2019-07-31 NOTE — Patient Outreach (Signed)
  [place] [facility]) Care Management Chronic Special Needs Program    07/31/2019  Name: Mikkos, Reiner: 1951/09/08  MRN: [id number]   Mr. Camry Faler is enrolled in a chronic special needs plan for Diabetes. Client called for scheduled follow up call. Client is on vacation in [place] and requests RNCM call back next week when he has returned home. Plan to outreach next week to complete call Peter Garter RN, [facility], [place] Management [phone]

## 2019-08-01 DIAGNOSIS — E10649 Type 1 diabetes mellitus with hypoglycemia without coma: Secondary | ICD-10-CM | POA: Diagnosis not present

## 2019-08-03 DIAGNOSIS — E669 Obesity, unspecified: Secondary | ICD-10-CM | POA: Diagnosis not present

## 2019-08-03 DIAGNOSIS — F10129 Alcohol abuse with intoxication, unspecified: Secondary | ICD-10-CM | POA: Diagnosis not present

## 2019-08-03 DIAGNOSIS — S3991XA Unspecified injury of abdomen, initial encounter: Secondary | ICD-10-CM | POA: Diagnosis not present

## 2019-08-03 DIAGNOSIS — Z6837 Body mass index (BMI) 37.0-37.9, adult: Secondary | ICD-10-CM | POA: Diagnosis not present

## 2019-08-03 DIAGNOSIS — F419 Anxiety disorder, unspecified: Secondary | ICD-10-CM | POA: Diagnosis not present

## 2019-08-03 DIAGNOSIS — I2510 Atherosclerotic heart disease of native coronary artery without angina pectoris: Secondary | ICD-10-CM | POA: Diagnosis not present

## 2019-08-03 DIAGNOSIS — N289 Disorder of kidney and ureter, unspecified: Secondary | ICD-10-CM | POA: Diagnosis not present

## 2019-08-03 DIAGNOSIS — J90 Pleural effusion, not elsewhere classified: Secondary | ICD-10-CM | POA: Diagnosis not present

## 2019-08-03 DIAGNOSIS — Z20828 Contact with and (suspected) exposure to other viral communicable diseases: Secondary | ICD-10-CM | POA: Diagnosis not present

## 2019-08-03 DIAGNOSIS — I10 Essential (primary) hypertension: Secondary | ICD-10-CM | POA: Diagnosis not present

## 2019-08-03 DIAGNOSIS — R0989 Other specified symptoms and signs involving the circulatory and respiratory systems: Secondary | ICD-10-CM | POA: Diagnosis not present

## 2019-08-03 DIAGNOSIS — S0990XA Unspecified injury of head, initial encounter: Secondary | ICD-10-CM | POA: Diagnosis not present

## 2019-08-03 DIAGNOSIS — S2242XA Multiple fractures of ribs, left side, initial encounter for closed fracture: Secondary | ICD-10-CM | POA: Diagnosis not present

## 2019-08-03 DIAGNOSIS — R918 Other nonspecific abnormal finding of lung field: Secondary | ICD-10-CM | POA: Diagnosis not present

## 2019-08-03 DIAGNOSIS — W19XXXA Unspecified fall, initial encounter: Secondary | ICD-10-CM | POA: Diagnosis not present

## 2019-08-03 DIAGNOSIS — Z794 Long term (current) use of insulin: Secondary | ICD-10-CM | POA: Diagnosis not present

## 2019-08-03 DIAGNOSIS — I252 Old myocardial infarction: Secondary | ICD-10-CM | POA: Diagnosis not present

## 2019-08-03 DIAGNOSIS — G4733 Obstructive sleep apnea (adult) (pediatric): Secondary | ICD-10-CM | POA: Diagnosis not present

## 2019-08-03 DIAGNOSIS — E1121 Type 2 diabetes mellitus with diabetic nephropathy: Secondary | ICD-10-CM | POA: Diagnosis not present

## 2019-08-03 DIAGNOSIS — E1021 Type 1 diabetes mellitus with diabetic nephropathy: Secondary | ICD-10-CM | POA: Diagnosis not present

## 2019-08-03 DIAGNOSIS — R0689 Other abnormalities of breathing: Secondary | ICD-10-CM | POA: Diagnosis not present

## 2019-08-03 DIAGNOSIS — S299XXA Unspecified injury of thorax, initial encounter: Secondary | ICD-10-CM | POA: Diagnosis not present

## 2019-08-03 DIAGNOSIS — F329 Major depressive disorder, single episode, unspecified: Secondary | ICD-10-CM | POA: Diagnosis not present

## 2019-08-08 ENCOUNTER — Other Ambulatory Visit: Payer: Self-pay

## 2019-08-08 NOTE — Patient Outreach (Signed)
  [facility]) Care Management Chronic Special Needs Program    08/08/2019  Name: [name], [name]: 01-21-1951  MRN: [id number]   [name] is enrolled in a chronic special needs plan for Diabetes. Called client for follow up call States that he is still in [place].  States he fell and broke 4 ribs and is in a lot of pain.  States he is flying back home tomorrow and requests RNCM call him back later next week.  States he plan to follow up with his doctor when he gets home Plan to outreach next week. Peter Garter RN, [place]ul[place], CDE Chronic Care Management Coordinator [place] [facility] Care Management [phone]

## 2019-08-13 ENCOUNTER — Other Ambulatory Visit: Payer: Self-pay

## 2019-08-13 NOTE — Patient Outreach (Signed)
  [facility]) Care Management Chronic Special Needs Program  08/13/2019  Name: Jeremy Hickman DOB: [birth year]  MRN: [id number]  [name] is enrolled in a chronic special needs plan for Diabetes. Reviewed and updated care plan.  Client admitted to [facility] hospital on 08/03/19 and discharged on 08/07/19 with dx of fall with fractured ribs.   Goals Addressed            This Visit's Progress   . General - Client will not be readmitted within 30 days (C-SNP)discharged 08/07/19       Please follow discharge instructions and call provider if you have any questions. Please attend all follow up appointments as scheduled. Please take your medications as prescribed. Please call 24 Hour nurse advice line as needed [phone]).      Transition of care call to be completed by [employer] utilization management team and RNCM will follow up per their recommendations  Plan:  Send  outreach letter with a copy of their individualized care plan and Send individual care plan to provider  Chronic care management coordinator will outreach in: as scheduled      Jeremy Garter RN, [facility], [place] Management Coordinator [place] Management [phone]

## 2019-08-14 DIAGNOSIS — R350 Frequency of micturition: Secondary | ICD-10-CM | POA: Diagnosis not present

## 2019-08-14 DIAGNOSIS — R110 Nausea: Secondary | ICD-10-CM | POA: Diagnosis not present

## 2019-08-14 DIAGNOSIS — R81 Glycosuria: Secondary | ICD-10-CM | POA: Diagnosis not present

## 2019-08-14 DIAGNOSIS — R7309 Other abnormal glucose: Secondary | ICD-10-CM | POA: Diagnosis not present

## 2019-08-15 ENCOUNTER — Other Ambulatory Visit: Payer: Self-pay

## 2019-08-15 NOTE — Patient Outreach (Signed)
  [facility]) Care Management Chronic Special Needs Program   08/15/2019  Name: Jeremy Hickman, DOB: 1951-03-30  MRN: [id number]  The client was discussed in today's interdisciplinary care team meeting.  The following issues were discussed:  Changes in health status, Care Plan and Care transitions  Participants present:   Thea Silversmith, MSN, RN, CCM   Elia Nunley RN,BSN,CCM, CDE  Quinn Plowman RN, BSN, CCM Kelli Churn, RN, CCM, CDE  Marco Collie, MD Maryella Shivers, MD  Gilda Crease, PharmD, RPh Bary Castilla, RN, BSN, MS, CCM Coralie Carpen, MD [place] Care CBCS/CMAA   Recommendations:  [employer] utilization management team to complete transition of care call and RNCM will follow up per their recommendations Discuss with client to be sure to follow up on finding of pulmonary nodules with primary care provider   Follow-up:  As planned on 08/17/19   Peter Garter RN, Jackquline [place], CDE Chronic Care Management Coordinator [place] Management [phone]

## 2019-08-17 ENCOUNTER — Other Ambulatory Visit: Payer: Self-pay
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
## 2019-08-23 DIAGNOSIS — R002 Palpitations: Secondary | ICD-10-CM | POA: Diagnosis not present

## 2019-08-23 DIAGNOSIS — I252 Old myocardial infarction: Secondary | ICD-10-CM | POA: Diagnosis not present

## 2019-08-23 DIAGNOSIS — I2510 Atherosclerotic heart disease of native coronary artery without angina pectoris: Secondary | ICD-10-CM | POA: Diagnosis not present

## 2019-08-23 DIAGNOSIS — I10 Essential (primary) hypertension: Secondary | ICD-10-CM | POA: Diagnosis not present

## 2019-08-23 DIAGNOSIS — E785 Hyperlipidemia, unspecified: Secondary | ICD-10-CM | POA: Diagnosis not present

## 2019-09-03 DIAGNOSIS — E10649 Type 1 diabetes mellitus with hypoglycemia without coma: Secondary | ICD-10-CM | POA: Diagnosis not present

## 2019-09-17 ENCOUNTER — Other Ambulatory Visit: Payer: Self-pay

## 2019-09-17 NOTE — Patient Outreach (Signed)
  [place]) Care Management Chronic Special Needs Program  09/17/2019  Name: Jeremy Hickman DOB: September 05, 1951  MRN: [id number]  Mr. Pearson Widmeyer is enrolled in a chronic special needs plan for Diabetes. Reviewed and updated care plan.  Subjective: Client states that he has been doing good and has no pain or problems from his fall.  States he is going to the gym 3 times a week now.  Denies any issues with his diabetes and he is to see his endocrinologist after the first of the year.  Goals Addressed            This Visit's Progress   . Client understands the importance of follow-up with providers by attending scheduled visits   On track   . Client verbalizes knowledge of Heart Attack and/or  CAD self management skills in next 6 months(continue 08/17/19)   On track   . Client will verbalize knowledge of self management of Hypertension as evidences by BP reading of 140/90 or less; or as defined by provider   On track   . COMPLETED: General - Client will not be readmitted within 30 days (C-SNP)discharged 08/07/19       No readmission post 30 days from discharge    . HEMOGLOBIN A1C < 7.0        Diabetes self management actions:  Glucose monitoring per provider recommendations  Eat Healthy  Check feet daily  Visit provider every 3-6 months as directed  Hbg A1C level every 3-6 months.  Eye Exam yearly    . Maintain timely refills of diabetic medication as prescribed within the year .   On track   . Obtain Annual Eye (retinal)  Exam    On track   . Obtain Annual Foot Exam   On track     Client has not been readmitted to hospital post 30 days from discharge on 08/07/19.  Client has followed up with his providers and is taking medicaitons as directed Reinforced to keep his appts with his endocrinologist and to call if he has frequent hypoglycemia Reviewed fall prevention and  safety precautions Plan:  Send successful outreach letter with a copy of their  individualized care plan and Send individual care plan to provider  Chronic care management coordinator will outreach in:  6-9 Months per tier level     Peter Garter RN, Jackquline [place], [place] Management [phone]

## 2019-09-24 DIAGNOSIS — E10649 Type 1 diabetes mellitus with hypoglycemia without coma: Secondary | ICD-10-CM | POA: Diagnosis not present

## 2019-09-24 DIAGNOSIS — E1065 Type 1 diabetes mellitus with hyperglycemia: Secondary | ICD-10-CM | POA: Diagnosis not present

## 2019-10-03 DIAGNOSIS — E10649 Type 1 diabetes mellitus with hypoglycemia without coma: Secondary | ICD-10-CM | POA: Diagnosis not present

## 2019-10-15 DIAGNOSIS — J301 Allergic rhinitis due to pollen: Secondary | ICD-10-CM | POA: Diagnosis not present

## 2019-10-15 DIAGNOSIS — E1065 Type 1 diabetes mellitus with hyperglycemia: Secondary | ICD-10-CM | POA: Diagnosis not present

## 2019-10-15 DIAGNOSIS — E669 Obesity, unspecified: Secondary | ICD-10-CM | POA: Diagnosis not present

## 2019-10-15 DIAGNOSIS — E10649 Type 1 diabetes mellitus with hypoglycemia without coma: Secondary | ICD-10-CM | POA: Diagnosis not present

## 2019-10-23 DIAGNOSIS — E10649 Type 1 diabetes mellitus with hypoglycemia without coma: Secondary | ICD-10-CM | POA: Diagnosis not present

## 2019-10-23 DIAGNOSIS — E1065 Type 1 diabetes mellitus with hyperglycemia: Secondary | ICD-10-CM | POA: Diagnosis not present

## 2019-11-06 DIAGNOSIS — I10 Essential (primary) hypertension: Secondary | ICD-10-CM | POA: Diagnosis not present

## 2019-11-06 DIAGNOSIS — E785 Hyperlipidemia, unspecified: Secondary | ICD-10-CM | POA: Diagnosis not present

## 2019-11-06 DIAGNOSIS — I2510 Atherosclerotic heart disease of native coronary artery without angina pectoris: Secondary | ICD-10-CM | POA: Diagnosis not present

## 2019-11-06 DIAGNOSIS — E10649 Type 1 diabetes mellitus with hypoglycemia without coma: Secondary | ICD-10-CM | POA: Diagnosis not present

## 2019-11-06 DIAGNOSIS — E1142 Type 2 diabetes mellitus with diabetic polyneuropathy: Secondary | ICD-10-CM | POA: Diagnosis not present

## 2019-11-08 DIAGNOSIS — M25512 Pain in left shoulder: Secondary | ICD-10-CM | POA: Diagnosis not present

## 2019-11-08 DIAGNOSIS — E10649 Type 1 diabetes mellitus with hypoglycemia without coma: Secondary | ICD-10-CM | POA: Diagnosis not present

## 2019-11-08 DIAGNOSIS — M19012 Primary osteoarthritis, left shoulder: Secondary | ICD-10-CM | POA: Diagnosis not present

## 2019-11-27 DIAGNOSIS — L218 Other seborrheic dermatitis: Secondary | ICD-10-CM | POA: Diagnosis not present

## 2019-11-27 DIAGNOSIS — L570 Actinic keratosis: Secondary | ICD-10-CM | POA: Diagnosis not present

## 2019-12-09 DIAGNOSIS — E10649 Type 1 diabetes mellitus with hypoglycemia without coma: Secondary | ICD-10-CM | POA: Diagnosis not present

## 2019-12-21 DIAGNOSIS — E1142 Type 2 diabetes mellitus with diabetic polyneuropathy: Secondary | ICD-10-CM | POA: Diagnosis not present

## 2019-12-21 DIAGNOSIS — G4733 Obstructive sleep apnea (adult) (pediatric): Secondary | ICD-10-CM | POA: Diagnosis not present

## 2019-12-21 DIAGNOSIS — R419 Unspecified symptoms and signs involving cognitive functions and awareness: Secondary | ICD-10-CM | POA: Diagnosis not present

## 2019-12-21 DIAGNOSIS — R269 Unspecified abnormalities of gait and mobility: Secondary | ICD-10-CM | POA: Diagnosis not present

## 2019-12-25 DIAGNOSIS — F39 Unspecified mood [affective] disorder: Secondary | ICD-10-CM | POA: Diagnosis not present

## 2019-12-25 DIAGNOSIS — R81 Glycosuria: Secondary | ICD-10-CM | POA: Diagnosis not present

## 2019-12-25 DIAGNOSIS — R350 Frequency of micturition: Secondary | ICD-10-CM | POA: Diagnosis not present

## 2019-12-26 DIAGNOSIS — E1065 Type 1 diabetes mellitus with hyperglycemia: Secondary | ICD-10-CM | POA: Diagnosis not present

## 2019-12-26 DIAGNOSIS — E10649 Type 1 diabetes mellitus with hypoglycemia without coma: Secondary | ICD-10-CM | POA: Diagnosis not present

## 2020-01-07 DIAGNOSIS — E10649 Type 1 diabetes mellitus with hypoglycemia without coma: Secondary | ICD-10-CM | POA: Diagnosis not present

## 2020-01-07 DIAGNOSIS — G4733 Obstructive sleep apnea (adult) (pediatric): Secondary | ICD-10-CM | POA: Diagnosis not present

## 2020-01-10 DIAGNOSIS — M1712 Unilateral primary osteoarthritis, left knee: Secondary | ICD-10-CM | POA: Diagnosis not present

## 2020-01-21 DIAGNOSIS — Z79899 Other long term (current) drug therapy: Secondary | ICD-10-CM | POA: Diagnosis not present

## 2020-01-21 DIAGNOSIS — F39 Unspecified mood [affective] disorder: Secondary | ICD-10-CM | POA: Diagnosis not present

## 2020-01-21 DIAGNOSIS — F909 Attention-deficit hyperactivity disorder, unspecified type: Secondary | ICD-10-CM | POA: Diagnosis not present

## 2020-01-23 DIAGNOSIS — K649 Unspecified hemorrhoids: Secondary | ICD-10-CM | POA: Diagnosis not present

## 2020-01-23 DIAGNOSIS — R911 Solitary pulmonary nodule: Secondary | ICD-10-CM | POA: Diagnosis not present

## 2020-02-01 DIAGNOSIS — G4733 Obstructive sleep apnea (adult) (pediatric): Secondary | ICD-10-CM | POA: Diagnosis not present

## 2020-02-01 DIAGNOSIS — R269 Unspecified abnormalities of gait and mobility: Secondary | ICD-10-CM | POA: Diagnosis not present

## 2020-02-01 DIAGNOSIS — R918 Other nonspecific abnormal finding of lung field: Secondary | ICD-10-CM | POA: Diagnosis not present

## 2020-02-01 DIAGNOSIS — E1142 Type 2 diabetes mellitus with diabetic polyneuropathy: Secondary | ICD-10-CM | POA: Diagnosis not present

## 2020-02-01 DIAGNOSIS — I700 Atherosclerosis of aorta: Secondary | ICD-10-CM | POA: Diagnosis not present

## 2020-02-01 DIAGNOSIS — R419 Unspecified symptoms and signs involving cognitive functions and awareness: Secondary | ICD-10-CM | POA: Diagnosis not present

## 2020-02-04 DIAGNOSIS — M25512 Pain in left shoulder: Secondary | ICD-10-CM | POA: Diagnosis not present

## 2020-02-05 DIAGNOSIS — K649 Unspecified hemorrhoids: Secondary | ICD-10-CM | POA: Diagnosis not present

## 2020-02-08 DIAGNOSIS — E10649 Type 1 diabetes mellitus with hypoglycemia without coma: Secondary | ICD-10-CM | POA: Diagnosis not present

## 2020-02-11 DIAGNOSIS — R413 Other amnesia: Secondary | ICD-10-CM | POA: Diagnosis not present

## 2020-02-11 DIAGNOSIS — E669 Obesity, unspecified: Secondary | ICD-10-CM | POA: Diagnosis not present

## 2020-02-11 DIAGNOSIS — Z6833 Body mass index (BMI) 33.0-33.9, adult: Secondary | ICD-10-CM | POA: Diagnosis not present

## 2020-02-11 DIAGNOSIS — E10649 Type 1 diabetes mellitus with hypoglycemia without coma: Secondary | ICD-10-CM | POA: Diagnosis not present

## 2020-02-11 DIAGNOSIS — E1065 Type 1 diabetes mellitus with hyperglycemia: Secondary | ICD-10-CM | POA: Diagnosis not present

## 2020-02-12 ENCOUNTER — Ambulatory Visit: Payer: Self-pay

## 2020-02-13 DIAGNOSIS — L089 Local infection of the skin and subcutaneous tissue, unspecified: Secondary | ICD-10-CM | POA: Diagnosis not present

## 2020-02-13 DIAGNOSIS — T148XXA Other injury of unspecified body region, initial encounter: Secondary | ICD-10-CM | POA: Diagnosis not present

## 2020-02-13 DIAGNOSIS — L03116 Cellulitis of left lower limb: Secondary | ICD-10-CM | POA: Diagnosis not present

## 2020-02-14 DIAGNOSIS — M75122 Complete rotator cuff tear or rupture of left shoulder, not specified as traumatic: Secondary | ICD-10-CM | POA: Diagnosis not present

## 2020-02-14 DIAGNOSIS — M7582 Other shoulder lesions, left shoulder: Secondary | ICD-10-CM | POA: Diagnosis not present

## 2020-02-19 ENCOUNTER — Other Ambulatory Visit: Payer: Self-pay

## 2020-02-19 DIAGNOSIS — D1801 Hemangioma of skin and subcutaneous tissue: Secondary | ICD-10-CM | POA: Diagnosis not present

## 2020-02-19 DIAGNOSIS — H5212 Myopia, left eye: Secondary | ICD-10-CM | POA: Diagnosis not present

## 2020-02-19 DIAGNOSIS — L218 Other seborrheic dermatitis: Secondary | ICD-10-CM | POA: Diagnosis not present

## 2020-02-19 DIAGNOSIS — H524 Presbyopia: Secondary | ICD-10-CM | POA: Diagnosis not present

## 2020-02-19 DIAGNOSIS — E109 Type 1 diabetes mellitus without complications: Secondary | ICD-10-CM | POA: Diagnosis not present

## 2020-02-19 DIAGNOSIS — L821 Other seborrheic keratosis: Secondary | ICD-10-CM | POA: Diagnosis not present

## 2020-02-19 DIAGNOSIS — H40011 Open angle with borderline findings, low risk, right eye: Secondary | ICD-10-CM | POA: Diagnosis not present
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
## 2020-02-28 DIAGNOSIS — K649 Unspecified hemorrhoids: Secondary | ICD-10-CM | POA: Diagnosis not present

## 2020-02-29 DIAGNOSIS — Z20822 Contact with and (suspected) exposure to covid-19: Secondary | ICD-10-CM | POA: Diagnosis not present

## 2020-02-29 DIAGNOSIS — M75102 Unspecified rotator cuff tear or rupture of left shoulder, not specified as traumatic: Secondary | ICD-10-CM | POA: Diagnosis not present

## 2020-02-29 DIAGNOSIS — Z01812 Encounter for preprocedural laboratory examination: Secondary | ICD-10-CM | POA: Diagnosis not present

## 2020-03-03 DIAGNOSIS — M65812 Other synovitis and tenosynovitis, left shoulder: Secondary | ICD-10-CM | POA: Diagnosis not present

## 2020-03-03 DIAGNOSIS — Z794 Long term (current) use of insulin: Secondary | ICD-10-CM | POA: Diagnosis not present

## 2020-03-03 DIAGNOSIS — S46012A Strain of muscle(s) and tendon(s) of the rotator cuff of left shoulder, initial encounter: Secondary | ICD-10-CM | POA: Diagnosis not present

## 2020-03-03 DIAGNOSIS — Z87891 Personal history of nicotine dependence: Secondary | ICD-10-CM | POA: Diagnosis not present

## 2020-03-03 DIAGNOSIS — M7552 Bursitis of left shoulder: Secondary | ICD-10-CM | POA: Diagnosis not present

## 2020-03-03 DIAGNOSIS — E1142 Type 2 diabetes mellitus with diabetic polyneuropathy: Secondary | ICD-10-CM | POA: Diagnosis not present

## 2020-03-03 DIAGNOSIS — I252 Old myocardial infarction: Secondary | ICD-10-CM | POA: Diagnosis not present

## 2020-03-03 DIAGNOSIS — S46212A Strain of muscle, fascia and tendon of other parts of biceps, left arm, initial encounter: Secondary | ICD-10-CM | POA: Diagnosis not present

## 2020-03-03 DIAGNOSIS — I2510 Atherosclerotic heart disease of native coronary artery without angina pectoris: Secondary | ICD-10-CM | POA: Diagnosis not present

## 2020-03-03 DIAGNOSIS — G8918 Other acute postprocedural pain: Secondary | ICD-10-CM | POA: Diagnosis not present

## 2020-03-03 DIAGNOSIS — M75102 Unspecified rotator cuff tear or rupture of left shoulder, not specified as traumatic: Secondary | ICD-10-CM | POA: Diagnosis not present

## 2020-03-07 DIAGNOSIS — M25612 Stiffness of left shoulder, not elsewhere classified: Secondary | ICD-10-CM | POA: Diagnosis not present

## 2020-03-07 DIAGNOSIS — M6281 Muscle weakness (generalized): Secondary | ICD-10-CM | POA: Diagnosis not present

## 2020-03-07 DIAGNOSIS — M25512 Pain in left shoulder: Secondary | ICD-10-CM | POA: Diagnosis not present

## 2020-03-07 DIAGNOSIS — Z4789 Encounter for other orthopedic aftercare: Secondary | ICD-10-CM | POA: Diagnosis not present

## 2020-03-09 DIAGNOSIS — E10649 Type 1 diabetes mellitus with hypoglycemia without coma: Secondary | ICD-10-CM | POA: Diagnosis not present

## 2020-03-14 DIAGNOSIS — M6281 Muscle weakness (generalized): Secondary | ICD-10-CM | POA: Diagnosis not present

## 2020-03-14 DIAGNOSIS — Z4789 Encounter for other orthopedic aftercare: Secondary | ICD-10-CM | POA: Diagnosis not present

## 2020-03-14 DIAGNOSIS — M25612 Stiffness of left shoulder, not elsewhere classified: Secondary | ICD-10-CM | POA: Diagnosis not present

## 2020-03-14 DIAGNOSIS — M25512 Pain in left shoulder: Secondary | ICD-10-CM | POA: Diagnosis not present

## 2020-03-19 DIAGNOSIS — M6281 Muscle weakness (generalized): Secondary | ICD-10-CM | POA: Diagnosis not present

## 2020-03-19 DIAGNOSIS — M25612 Stiffness of left shoulder, not elsewhere classified: Secondary | ICD-10-CM | POA: Diagnosis not present

## 2020-03-19 DIAGNOSIS — M25512 Pain in left shoulder: Secondary | ICD-10-CM | POA: Diagnosis not present

## 2020-03-19 DIAGNOSIS — Z4789 Encounter for other orthopedic aftercare: Secondary | ICD-10-CM | POA: Diagnosis not present

## 2020-03-24 DIAGNOSIS — E669 Obesity, unspecified: Secondary | ICD-10-CM | POA: Diagnosis not present

## 2020-03-24 DIAGNOSIS — E1065 Type 1 diabetes mellitus with hyperglycemia: Secondary | ICD-10-CM | POA: Diagnosis not present

## 2020-03-24 DIAGNOSIS — E10649 Type 1 diabetes mellitus with hypoglycemia without coma: Secondary | ICD-10-CM | POA: Diagnosis not present

## 2020-03-25 DIAGNOSIS — M25612 Stiffness of left shoulder, not elsewhere classified: Secondary | ICD-10-CM | POA: Diagnosis not present

## 2020-03-25 DIAGNOSIS — M6281 Muscle weakness (generalized): Secondary | ICD-10-CM | POA: Diagnosis not present

## 2020-03-25 DIAGNOSIS — M25512 Pain in left shoulder: Secondary | ICD-10-CM | POA: Diagnosis not present

## 2020-03-25 DIAGNOSIS — Z4789 Encounter for other orthopedic aftercare: Secondary | ICD-10-CM | POA: Diagnosis not present

## 2020-04-01 DIAGNOSIS — M25512 Pain in left shoulder: Secondary | ICD-10-CM | POA: Diagnosis not present

## 2020-04-01 DIAGNOSIS — K649 Unspecified hemorrhoids: Secondary | ICD-10-CM | POA: Diagnosis not present

## 2020-04-01 DIAGNOSIS — Z4789 Encounter for other orthopedic aftercare: Secondary | ICD-10-CM | POA: Diagnosis not present

## 2020-04-01 DIAGNOSIS — M6281 Muscle weakness (generalized): Secondary | ICD-10-CM | POA: Diagnosis not present

## 2020-04-01 DIAGNOSIS — M25612 Stiffness of left shoulder, not elsewhere classified: Secondary | ICD-10-CM | POA: Diagnosis not present

## 2020-04-01 DIAGNOSIS — Z20822 Contact with and (suspected) exposure to covid-19: Secondary | ICD-10-CM | POA: Diagnosis not present

## 2020-04-01 DIAGNOSIS — Z01812 Encounter for preprocedural laboratory examination: Secondary | ICD-10-CM | POA: Diagnosis not present

## 2020-04-02 DIAGNOSIS — I10 Essential (primary) hypertension: Secondary | ICD-10-CM | POA: Diagnosis not present

## 2020-04-02 DIAGNOSIS — E785 Hyperlipidemia, unspecified: Secondary | ICD-10-CM | POA: Diagnosis not present

## 2020-04-02 DIAGNOSIS — I252 Old myocardial infarction: Secondary | ICD-10-CM | POA: Diagnosis not present

## 2020-04-02 DIAGNOSIS — I2510 Atherosclerotic heart disease of native coronary artery without angina pectoris: Secondary | ICD-10-CM | POA: Diagnosis not present

## 2020-04-07 DIAGNOSIS — K648 Other hemorrhoids: Secondary | ICD-10-CM | POA: Diagnosis not present

## 2020-04-07 DIAGNOSIS — G4733 Obstructive sleep apnea (adult) (pediatric): Secondary | ICD-10-CM | POA: Diagnosis not present

## 2020-04-07 DIAGNOSIS — K649 Unspecified hemorrhoids: Secondary | ICD-10-CM | POA: Diagnosis not present

## 2020-04-07 DIAGNOSIS — Z794 Long term (current) use of insulin: Secondary | ICD-10-CM | POA: Diagnosis not present

## 2020-04-07 DIAGNOSIS — E109 Type 1 diabetes mellitus without complications: Secondary | ICD-10-CM | POA: Diagnosis not present

## 2020-04-08 DIAGNOSIS — M6281 Muscle weakness (generalized): Secondary | ICD-10-CM | POA: Diagnosis not present

## 2020-04-08 DIAGNOSIS — M25612 Stiffness of left shoulder, not elsewhere classified: Secondary | ICD-10-CM | POA: Diagnosis not present

## 2020-04-08 DIAGNOSIS — Z4789 Encounter for other orthopedic aftercare: Secondary | ICD-10-CM | POA: Diagnosis not present

## 2020-04-08 DIAGNOSIS — M25512 Pain in left shoulder: Secondary | ICD-10-CM | POA: Diagnosis not present

## 2020-04-09 DIAGNOSIS — E10649 Type 1 diabetes mellitus with hypoglycemia without coma: Secondary | ICD-10-CM | POA: Diagnosis not present

## 2020-04-11 DIAGNOSIS — M25512 Pain in left shoulder: Secondary | ICD-10-CM | POA: Diagnosis not present

## 2020-04-11 DIAGNOSIS — M25612 Stiffness of left shoulder, not elsewhere classified: Secondary | ICD-10-CM | POA: Diagnosis not present

## 2020-04-11 DIAGNOSIS — M6281 Muscle weakness (generalized): Secondary | ICD-10-CM | POA: Diagnosis not present

## 2020-04-11 DIAGNOSIS — Z4789 Encounter for other orthopedic aftercare: Secondary | ICD-10-CM | POA: Diagnosis not present

## 2020-04-16 DIAGNOSIS — Z4789 Encounter for other orthopedic aftercare: Secondary | ICD-10-CM | POA: Diagnosis not present

## 2020-04-16 DIAGNOSIS — M6281 Muscle weakness (generalized): Secondary | ICD-10-CM | POA: Diagnosis not present

## 2020-04-16 DIAGNOSIS — M25612 Stiffness of left shoulder, not elsewhere classified: Secondary | ICD-10-CM | POA: Diagnosis not present

## 2020-04-16 DIAGNOSIS — M25512 Pain in left shoulder: Secondary | ICD-10-CM | POA: Diagnosis not present

## 2020-04-22 DIAGNOSIS — Z4789 Encounter for other orthopedic aftercare: Secondary | ICD-10-CM | POA: Diagnosis not present

## 2020-04-22 DIAGNOSIS — M25512 Pain in left shoulder: Secondary | ICD-10-CM | POA: Diagnosis not present

## 2020-04-22 DIAGNOSIS — M6281 Muscle weakness (generalized): Secondary | ICD-10-CM | POA: Diagnosis not present

## 2020-04-22 DIAGNOSIS — M25612 Stiffness of left shoulder, not elsewhere classified: Secondary | ICD-10-CM | POA: Diagnosis not present

## 2020-05-09 DIAGNOSIS — M25512 Pain in left shoulder: Secondary | ICD-10-CM | POA: Diagnosis not present

## 2020-05-09 DIAGNOSIS — M6281 Muscle weakness (generalized): Secondary | ICD-10-CM | POA: Diagnosis not present

## 2020-05-09 DIAGNOSIS — M25612 Stiffness of left shoulder, not elsewhere classified: Secondary | ICD-10-CM | POA: Diagnosis not present

## 2020-05-13 DIAGNOSIS — M6281 Muscle weakness (generalized): Secondary | ICD-10-CM | POA: Diagnosis not present

## 2020-05-13 DIAGNOSIS — M25512 Pain in left shoulder: Secondary | ICD-10-CM | POA: Diagnosis not present

## 2020-05-13 DIAGNOSIS — Z4789 Encounter for other orthopedic aftercare: Secondary | ICD-10-CM | POA: Diagnosis not present

## 2020-05-13 DIAGNOSIS — M25612 Stiffness of left shoulder, not elsewhere classified: Secondary | ICD-10-CM | POA: Diagnosis not present

## 2020-05-19 DIAGNOSIS — E10649 Type 1 diabetes mellitus with hypoglycemia without coma: Secondary | ICD-10-CM | POA: Diagnosis not present

## 2020-05-22 DIAGNOSIS — M25512 Pain in left shoulder: Secondary | ICD-10-CM | POA: Diagnosis not present

## 2020-05-22 DIAGNOSIS — Z4789 Encounter for other orthopedic aftercare: Secondary | ICD-10-CM | POA: Diagnosis not present

## 2020-05-22 DIAGNOSIS — M25612 Stiffness of left shoulder, not elsewhere classified: Secondary | ICD-10-CM | POA: Diagnosis not present

## 2020-05-22 DIAGNOSIS — M6281 Muscle weakness (generalized): Secondary | ICD-10-CM | POA: Diagnosis not present

## 2020-05-28 DIAGNOSIS — Z4789 Encounter for other orthopedic aftercare: Secondary | ICD-10-CM | POA: Diagnosis not present

## 2020-05-28 DIAGNOSIS — M25612 Stiffness of left shoulder, not elsewhere classified: Secondary | ICD-10-CM | POA: Diagnosis not present

## 2020-05-28 DIAGNOSIS — M25512 Pain in left shoulder: Secondary | ICD-10-CM | POA: Diagnosis not present

## 2020-05-28 DIAGNOSIS — M6281 Muscle weakness (generalized): Secondary | ICD-10-CM | POA: Diagnosis not present

## 2020-06-11 DIAGNOSIS — F909 Attention-deficit hyperactivity disorder, unspecified type: Secondary | ICD-10-CM | POA: Diagnosis not present

## 2020-06-11 DIAGNOSIS — F39 Unspecified mood [affective] disorder: Secondary | ICD-10-CM | POA: Diagnosis not present

## 2020-06-11 DIAGNOSIS — Z79899 Other long term (current) drug therapy: Secondary | ICD-10-CM | POA: Diagnosis not present

## 2020-06-19 DIAGNOSIS — E10649 Type 1 diabetes mellitus with hypoglycemia without coma: Secondary | ICD-10-CM | POA: Diagnosis not present

## 2020-06-23 DIAGNOSIS — R419 Unspecified symptoms and signs involving cognitive functions and awareness: Secondary | ICD-10-CM | POA: Diagnosis not present

## 2020-06-23 DIAGNOSIS — G4733 Obstructive sleep apnea (adult) (pediatric): Secondary | ICD-10-CM | POA: Diagnosis not present

## 2020-06-23 DIAGNOSIS — R809 Proteinuria, unspecified: Secondary | ICD-10-CM | POA: Diagnosis not present

## 2020-06-23 DIAGNOSIS — R269 Unspecified abnormalities of gait and mobility: Secondary | ICD-10-CM | POA: Diagnosis not present

## 2020-06-23 DIAGNOSIS — E669 Obesity, unspecified: Secondary | ICD-10-CM | POA: Diagnosis not present

## 2020-06-23 DIAGNOSIS — E1142 Type 2 diabetes mellitus with diabetic polyneuropathy: Secondary | ICD-10-CM | POA: Diagnosis not present

## 2020-06-23 DIAGNOSIS — E1065 Type 1 diabetes mellitus with hyperglycemia: Secondary | ICD-10-CM | POA: Diagnosis not present

## 2020-06-23 DIAGNOSIS — E1029 Type 1 diabetes mellitus with other diabetic kidney complication: Secondary | ICD-10-CM | POA: Diagnosis not present

## 2020-07-07 DIAGNOSIS — G4733 Obstructive sleep apnea (adult) (pediatric): Secondary | ICD-10-CM | POA: Diagnosis not present

## 2020-07-10 DIAGNOSIS — S46012D Strain of muscle(s) and tendon(s) of the rotator cuff of left shoulder, subsequent encounter: Secondary | ICD-10-CM | POA: Diagnosis not present

## 2020-07-16 DIAGNOSIS — J1282 Pneumonia due to coronavirus disease 2019: Secondary | ICD-10-CM | POA: Diagnosis not present

## 2020-07-16 DIAGNOSIS — E785 Hyperlipidemia, unspecified: Secondary | ICD-10-CM | POA: Diagnosis not present

## 2020-07-16 DIAGNOSIS — R9431 Abnormal electrocardiogram [ECG] [EKG]: Secondary | ICD-10-CM | POA: Diagnosis not present

## 2020-07-16 DIAGNOSIS — R062 Wheezing: Secondary | ICD-10-CM | POA: Diagnosis not present

## 2020-07-16 DIAGNOSIS — I4510 Unspecified right bundle-branch block: Secondary | ICD-10-CM | POA: Diagnosis not present

## 2020-07-16 DIAGNOSIS — R0602 Shortness of breath: Secondary | ICD-10-CM | POA: Diagnosis not present

## 2020-07-16 DIAGNOSIS — Z79899 Other long term (current) drug therapy: Secondary | ICD-10-CM | POA: Diagnosis not present

## 2020-07-16 DIAGNOSIS — U071 COVID-19: Secondary | ICD-10-CM | POA: Diagnosis not present

## 2020-07-16 DIAGNOSIS — I10 Essential (primary) hypertension: Secondary | ICD-10-CM | POA: Diagnosis not present

## 2020-07-16 DIAGNOSIS — J189 Pneumonia, unspecified organism: Secondary | ICD-10-CM | POA: Diagnosis not present

## 2020-07-16 DIAGNOSIS — R0902 Hypoxemia: Secondary | ICD-10-CM | POA: Diagnosis not present

## 2020-07-16 DIAGNOSIS — R0689 Other abnormalities of breathing: Secondary | ICD-10-CM | POA: Diagnosis not present

## 2020-07-21 DIAGNOSIS — E10649 Type 1 diabetes mellitus with hypoglycemia without coma: Secondary | ICD-10-CM | POA: Diagnosis not present

## 2020-07-23 DIAGNOSIS — J1282 Pneumonia due to coronavirus disease 2019: Secondary | ICD-10-CM | POA: Diagnosis not present

## 2020-07-23 DIAGNOSIS — U071 COVID-19: Secondary | ICD-10-CM | POA: Diagnosis not present

## 2020-07-29 DIAGNOSIS — L570 Actinic keratosis: Secondary | ICD-10-CM | POA: Diagnosis not present

## 2020-07-29 DIAGNOSIS — L821 Other seborrheic keratosis: Secondary | ICD-10-CM | POA: Diagnosis not present

## 2020-07-29 DIAGNOSIS — Z85828 Personal history of other malignant neoplasm of skin: Secondary | ICD-10-CM | POA: Diagnosis not present

## 2020-08-04 DIAGNOSIS — N644 Mastodynia: Secondary | ICD-10-CM | POA: Diagnosis not present

## 2020-08-04 DIAGNOSIS — N6342 Unspecified lump in left breast, subareolar: Secondary | ICD-10-CM | POA: Diagnosis not present

## 2020-08-19 DIAGNOSIS — N62 Hypertrophy of breast: Secondary | ICD-10-CM | POA: Diagnosis not present

## 2020-08-19 DIAGNOSIS — N644 Mastodynia: Secondary | ICD-10-CM | POA: Diagnosis not present

## 2020-08-21 DIAGNOSIS — E10649 Type 1 diabetes mellitus with hypoglycemia without coma: Secondary | ICD-10-CM | POA: Diagnosis not present

## 2020-08-22 ENCOUNTER — Other Ambulatory Visit: Payer: Self-pay

## 2020-08-22 NOTE — Patient Outreach (Signed)
  [place] [facility]) Care Management Chronic Special Needs Program    08/22/2020  Name: Jeremy Hickman, Jeremy Hickman: 22-Sep-1951  MRN: [id number]   [name] is enrolled in a chronic special needs plan for Diabetes.  [place] Management will continue to provide services for this client through 10/10/2020. [employer] care management team will assume care 10/11/2020.  Peter Garter RN, Jackquline [place], CDE Chronic Care Management Coordinator [place] [facility] Care Management [phone]

## 2020-09-14 DIAGNOSIS — R404 Transient alteration of awareness: Secondary | ICD-10-CM | POA: Diagnosis not present

## 2020-09-14 DIAGNOSIS — E162 Hypoglycemia, unspecified: Secondary | ICD-10-CM | POA: Diagnosis not present

## 2020-09-14 DIAGNOSIS — E161 Other hypoglycemia: Secondary | ICD-10-CM | POA: Diagnosis not present

## 2020-09-14 DIAGNOSIS — R569 Unspecified convulsions: Secondary | ICD-10-CM | POA: Diagnosis not present

## 2020-09-14 DIAGNOSIS — I4510 Unspecified right bundle-branch block: Secondary | ICD-10-CM | POA: Diagnosis not present

## 2020-09-22 DIAGNOSIS — E10649 Type 1 diabetes mellitus with hypoglycemia without coma: Secondary | ICD-10-CM | POA: Diagnosis not present

## 2020-10-14 DIAGNOSIS — E10649 Type 1 diabetes mellitus with hypoglycemia without coma: Secondary | ICD-10-CM | POA: Diagnosis not present

## 2020-10-14 DIAGNOSIS — S92501A Displaced unspecified fracture of right lesser toe(s), initial encounter for closed fracture: Secondary | ICD-10-CM | POA: Diagnosis not present

## 2020-10-14 DIAGNOSIS — S92511B Displaced fracture of proximal phalanx of right lesser toe(s), initial encounter for open fracture: Secondary | ICD-10-CM | POA: Diagnosis not present

## 2020-10-14 DIAGNOSIS — Z794 Long term (current) use of insulin: Secondary | ICD-10-CM | POA: Diagnosis not present

## 2020-10-14 DIAGNOSIS — Z23 Encounter for immunization: Secondary | ICD-10-CM | POA: Diagnosis not present

## 2020-10-14 DIAGNOSIS — S92511A Displaced fracture of proximal phalanx of right lesser toe(s), initial encounter for closed fracture: Secondary | ICD-10-CM | POA: Diagnosis not present

## 2020-10-15 DIAGNOSIS — E10649 Type 1 diabetes mellitus with hypoglycemia without coma: Secondary | ICD-10-CM | POA: Diagnosis not present

## 2020-10-15 DIAGNOSIS — S92511B Displaced fracture of proximal phalanx of right lesser toe(s), initial encounter for open fracture: Secondary | ICD-10-CM | POA: Diagnosis not present

## 2020-10-15 DIAGNOSIS — S91114A Laceration without foreign body of right lesser toe(s) without damage to nail, initial encounter: Secondary | ICD-10-CM | POA: Diagnosis not present

## 2020-10-17 ENCOUNTER — Other Ambulatory Visit: Payer: Self-pay

## 2020-10-21 DIAGNOSIS — L304 Erythema intertrigo: Secondary | ICD-10-CM | POA: Diagnosis not present

## 2020-10-21 DIAGNOSIS — L820 Inflamed seborrheic keratosis: Secondary | ICD-10-CM | POA: Diagnosis not present

## 2020-10-21 DIAGNOSIS — Z85828 Personal history of other malignant neoplasm of skin: Secondary | ICD-10-CM | POA: Diagnosis not present

## 2020-10-22 DIAGNOSIS — E10649 Type 1 diabetes mellitus with hypoglycemia without coma: Secondary | ICD-10-CM | POA: Diagnosis not present

## 2020-10-22 DIAGNOSIS — S92511D Displaced fracture of proximal phalanx of right lesser toe(s), subsequent encounter for fracture with routine healing: Secondary | ICD-10-CM | POA: Diagnosis not present

## 2020-10-23 DIAGNOSIS — G479 Sleep disorder, unspecified: Secondary | ICD-10-CM | POA: Diagnosis not present

## 2020-10-23 DIAGNOSIS — E1142 Type 2 diabetes mellitus with diabetic polyneuropathy: Secondary | ICD-10-CM | POA: Diagnosis not present

## 2020-10-23 DIAGNOSIS — G4733 Obstructive sleep apnea (adult) (pediatric): Secondary | ICD-10-CM | POA: Diagnosis not present

## 2020-10-23 DIAGNOSIS — Z9989 Dependence on other enabling machines and devices: Secondary | ICD-10-CM | POA: Diagnosis not present

## 2020-10-23 DIAGNOSIS — R269 Unspecified abnormalities of gait and mobility: Secondary | ICD-10-CM | POA: Diagnosis not present

## 2020-10-23 DIAGNOSIS — R419 Unspecified symptoms and signs involving cognitive functions and awareness: Secondary | ICD-10-CM | POA: Diagnosis not present

## 2020-11-04 DIAGNOSIS — E10649 Type 1 diabetes mellitus with hypoglycemia without coma: Secondary | ICD-10-CM | POA: Diagnosis not present

## 2020-11-05 DIAGNOSIS — I493 Ventricular premature depolarization: Secondary | ICD-10-CM | POA: Diagnosis not present

## 2020-11-05 DIAGNOSIS — E782 Mixed hyperlipidemia: Secondary | ICD-10-CM | POA: Diagnosis not present

## 2020-11-05 DIAGNOSIS — I2510 Atherosclerotic heart disease of native coronary artery without angina pectoris: Secondary | ICD-10-CM | POA: Diagnosis not present

## 2020-11-05 DIAGNOSIS — I4510 Unspecified right bundle-branch block: Secondary | ICD-10-CM | POA: Diagnosis not present

## 2020-11-05 DIAGNOSIS — I10 Essential (primary) hypertension: Secondary | ICD-10-CM | POA: Diagnosis not present

## 2020-11-05 DIAGNOSIS — I454 Nonspecific intraventricular block: Secondary | ICD-10-CM | POA: Diagnosis not present

## 2020-11-05 DIAGNOSIS — I252 Old myocardial infarction: Secondary | ICD-10-CM | POA: Diagnosis not present

## 2020-11-07 DIAGNOSIS — I10 Essential (primary) hypertension: Secondary | ICD-10-CM | POA: Diagnosis not present

## 2020-11-07 DIAGNOSIS — E785 Hyperlipidemia, unspecified: Secondary | ICD-10-CM | POA: Diagnosis not present

## 2020-11-07 DIAGNOSIS — E10649 Type 1 diabetes mellitus with hypoglycemia without coma: Secondary | ICD-10-CM | POA: Diagnosis not present

## 2020-11-07 DIAGNOSIS — I2510 Atherosclerotic heart disease of native coronary artery without angina pectoris: Secondary | ICD-10-CM | POA: Diagnosis not present

## 2020-11-07 DIAGNOSIS — F39 Unspecified mood [affective] disorder: Secondary | ICD-10-CM | POA: Diagnosis not present

## 2020-11-10 DIAGNOSIS — R609 Edema, unspecified: Secondary | ICD-10-CM | POA: Diagnosis not present

## 2020-11-10 DIAGNOSIS — R600 Localized edema: Secondary | ICD-10-CM | POA: Diagnosis not present

## 2020-11-10 DIAGNOSIS — M79604 Pain in right leg: Secondary | ICD-10-CM | POA: Diagnosis not present

## 2020-11-10 DIAGNOSIS — I872 Venous insufficiency (chronic) (peripheral): Secondary | ICD-10-CM | POA: Diagnosis not present

## 2020-11-11 DIAGNOSIS — I872 Venous insufficiency (chronic) (peripheral): Secondary | ICD-10-CM | POA: Diagnosis not present

## 2020-11-11 DIAGNOSIS — R609 Edema, unspecified: Secondary | ICD-10-CM | POA: Diagnosis not present

## 2020-11-11 DIAGNOSIS — M79661 Pain in right lower leg: Secondary | ICD-10-CM | POA: Diagnosis not present

## 2020-11-11 DIAGNOSIS — R600 Localized edema: Secondary | ICD-10-CM | POA: Diagnosis not present

## 2020-11-12 ENCOUNTER — Emergency Department (HOSPITAL_BASED_OUTPATIENT_CLINIC_OR_DEPARTMENT_OTHER): Payer: HMO

## 2020-11-12 ENCOUNTER — Inpatient Hospital Stay (HOSPITAL_BASED_OUTPATIENT_CLINIC_OR_DEPARTMENT_OTHER)
Admission: EM | Admit: 2020-11-12 | Discharge: 2020-11-27 | DRG: 981 | Disposition: A | Discharge status: Home / Self Care | Payer: HMO | Attending: Cardiothoracic Surgery | Admitting: Cardiothoracic Surgery

## 2020-11-12 ENCOUNTER — Encounter (HOSPITAL_BASED_OUTPATIENT_CLINIC_OR_DEPARTMENT_OTHER): Payer: Self-pay

## 2020-11-12 ENCOUNTER — Other Ambulatory Visit: Payer: Self-pay

## 2020-11-12 DIAGNOSIS — Z01810 Encounter for preprocedural cardiovascular examination: Secondary | ICD-10-CM | POA: Diagnosis not present

## 2020-11-12 DIAGNOSIS — J439 Emphysema, unspecified: Secondary | ICD-10-CM | POA: Diagnosis not present

## 2020-11-12 DIAGNOSIS — I214 Non-ST elevation (NSTEMI) myocardial infarction: Secondary | ICD-10-CM

## 2020-11-12 DIAGNOSIS — E1065 Type 1 diabetes mellitus with hyperglycemia: Secondary | ICD-10-CM | POA: Diagnosis present

## 2020-11-12 DIAGNOSIS — L089 Local infection of the skin and subcutaneous tissue, unspecified: Secondary | ICD-10-CM | POA: Diagnosis not present

## 2020-11-12 DIAGNOSIS — I517 Cardiomegaly: Secondary | ICD-10-CM | POA: Diagnosis not present

## 2020-11-12 DIAGNOSIS — Z9989 Dependence on other enabling machines and devices: Secondary | ICD-10-CM | POA: Diagnosis not present

## 2020-11-12 DIAGNOSIS — L0390 Cellulitis, unspecified: Secondary | ICD-10-CM | POA: Diagnosis present

## 2020-11-12 DIAGNOSIS — R0600 Dyspnea, unspecified: Secondary | ICD-10-CM | POA: Diagnosis not present

## 2020-11-12 DIAGNOSIS — J80 Acute respiratory distress syndrome: Secondary | ICD-10-CM | POA: Diagnosis not present

## 2020-11-12 DIAGNOSIS — I2510 Atherosclerotic heart disease of native coronary artery without angina pectoris: Secondary | ICD-10-CM | POA: Diagnosis present

## 2020-11-12 DIAGNOSIS — I340 Nonrheumatic mitral (valve) insufficiency: Secondary | ICD-10-CM | POA: Diagnosis not present

## 2020-11-12 DIAGNOSIS — Z7189 Other specified counseling: Secondary | ICD-10-CM | POA: Diagnosis not present

## 2020-11-12 DIAGNOSIS — I10 Essential (primary) hypertension: Secondary | ICD-10-CM | POA: Diagnosis present

## 2020-11-12 DIAGNOSIS — E785 Hyperlipidemia, unspecified: Secondary | ICD-10-CM | POA: Diagnosis present

## 2020-11-12 DIAGNOSIS — Z87891 Personal history of nicotine dependence: Secondary | ICD-10-CM

## 2020-11-12 DIAGNOSIS — L03116 Cellulitis of left lower limb: Secondary | ICD-10-CM | POA: Diagnosis not present

## 2020-11-12 DIAGNOSIS — I872 Venous insufficiency (chronic) (peripheral): Secondary | ICD-10-CM | POA: Diagnosis present

## 2020-11-12 DIAGNOSIS — J9811 Atelectasis: Secondary | ICD-10-CM | POA: Diagnosis not present

## 2020-11-12 DIAGNOSIS — Z7982 Long term (current) use of aspirin: Secondary | ICD-10-CM

## 2020-11-12 DIAGNOSIS — R140 Abdominal distension (gaseous): Secondary | ICD-10-CM | POA: Diagnosis not present

## 2020-11-12 DIAGNOSIS — I152 Hypertension secondary to endocrine disorders: Secondary | ICD-10-CM | POA: Diagnosis not present

## 2020-11-12 DIAGNOSIS — Z794 Long term (current) use of insulin: Secondary | ICD-10-CM | POA: Diagnosis not present

## 2020-11-12 DIAGNOSIS — E871 Hypo-osmolality and hyponatremia: Secondary | ICD-10-CM | POA: Diagnosis present

## 2020-11-12 DIAGNOSIS — G4700 Insomnia, unspecified: Secondary | ICD-10-CM | POA: Diagnosis present

## 2020-11-12 DIAGNOSIS — F39 Unspecified mood [affective] disorder: Secondary | ICD-10-CM | POA: Diagnosis present

## 2020-11-12 DIAGNOSIS — Z515 Encounter for palliative care: Secondary | ICD-10-CM | POA: Diagnosis not present

## 2020-11-12 DIAGNOSIS — S2232XK Fracture of one rib, left side, subsequent encounter for fracture with nonunion: Secondary | ICD-10-CM | POA: Diagnosis not present

## 2020-11-12 DIAGNOSIS — R0602 Shortness of breath: Secondary | ICD-10-CM | POA: Diagnosis not present

## 2020-11-12 DIAGNOSIS — E10649 Type 1 diabetes mellitus with hypoglycemia without coma: Secondary | ICD-10-CM | POA: Diagnosis not present

## 2020-11-12 DIAGNOSIS — Z9889 Other specified postprocedural states: Secondary | ICD-10-CM

## 2020-11-12 DIAGNOSIS — Z66 Do not resuscitate: Secondary | ICD-10-CM | POA: Diagnosis not present

## 2020-11-12 DIAGNOSIS — F419 Anxiety disorder, unspecified: Secondary | ICD-10-CM | POA: Diagnosis present

## 2020-11-12 DIAGNOSIS — R21 Rash and other nonspecific skin eruption: Secondary | ICD-10-CM | POA: Diagnosis not present

## 2020-11-12 DIAGNOSIS — I5043 Acute on chronic combined systolic (congestive) and diastolic (congestive) heart failure: Secondary | ICD-10-CM

## 2020-11-12 DIAGNOSIS — L03115 Cellulitis of right lower limb: Secondary | ICD-10-CM | POA: Diagnosis not present

## 2020-11-12 DIAGNOSIS — I110 Hypertensive heart disease with heart failure: Secondary | ICD-10-CM | POA: Diagnosis present

## 2020-11-12 DIAGNOSIS — Z951 Presence of aortocoronary bypass graft: Secondary | ICD-10-CM | POA: Diagnosis not present

## 2020-11-12 DIAGNOSIS — E1042 Type 1 diabetes mellitus with diabetic polyneuropathy: Secondary | ICD-10-CM | POA: Diagnosis present

## 2020-11-12 DIAGNOSIS — I4510 Unspecified right bundle-branch block: Secondary | ICD-10-CM | POA: Diagnosis present

## 2020-11-12 DIAGNOSIS — F0390 Unspecified dementia without behavioral disturbance: Secondary | ICD-10-CM | POA: Diagnosis not present

## 2020-11-12 DIAGNOSIS — Z20822 Contact with and (suspected) exposure to covid-19: Secondary | ICD-10-CM | POA: Diagnosis present

## 2020-11-12 DIAGNOSIS — I272 Pulmonary hypertension, unspecified: Secondary | ICD-10-CM | POA: Diagnosis present

## 2020-11-12 DIAGNOSIS — G4733 Obstructive sleep apnea (adult) (pediatric): Secondary | ICD-10-CM | POA: Diagnosis present

## 2020-11-12 DIAGNOSIS — Z6836 Body mass index (BMI) 36.0-36.9, adult: Secondary | ICD-10-CM | POA: Diagnosis not present

## 2020-11-12 DIAGNOSIS — R600 Localized edema: Secondary | ICD-10-CM | POA: Diagnosis not present

## 2020-11-12 DIAGNOSIS — Z955 Presence of coronary angioplasty implant and graft: Secondary | ICD-10-CM | POA: Diagnosis not present

## 2020-11-12 DIAGNOSIS — Z79899 Other long term (current) drug therapy: Secondary | ICD-10-CM | POA: Diagnosis not present

## 2020-11-12 DIAGNOSIS — I509 Heart failure, unspecified: Secondary | ICD-10-CM | POA: Diagnosis not present

## 2020-11-12 DIAGNOSIS — E1159 Type 2 diabetes mellitus with other circulatory complications: Secondary | ICD-10-CM | POA: Diagnosis not present

## 2020-11-12 DIAGNOSIS — R0603 Acute respiratory distress: Secondary | ICD-10-CM | POA: Diagnosis not present

## 2020-11-12 DIAGNOSIS — D62 Acute posthemorrhagic anemia: Secondary | ICD-10-CM | POA: Diagnosis not present

## 2020-11-12 DIAGNOSIS — T380X5A Adverse effect of glucocorticoids and synthetic analogues, initial encounter: Secondary | ICD-10-CM | POA: Diagnosis present

## 2020-11-12 DIAGNOSIS — R419 Unspecified symptoms and signs involving cognitive functions and awareness: Secondary | ICD-10-CM | POA: Diagnosis not present

## 2020-11-12 DIAGNOSIS — I25110 Atherosclerotic heart disease of native coronary artery with unstable angina pectoris: Secondary | ICD-10-CM | POA: Diagnosis not present

## 2020-11-12 DIAGNOSIS — A419 Sepsis, unspecified organism: Secondary | ICD-10-CM | POA: Diagnosis not present

## 2020-11-12 DIAGNOSIS — E1169 Type 2 diabetes mellitus with other specified complication: Secondary | ICD-10-CM | POA: Diagnosis not present

## 2020-11-12 DIAGNOSIS — J811 Chronic pulmonary edema: Secondary | ICD-10-CM | POA: Diagnosis not present

## 2020-11-12 DIAGNOSIS — E669 Obesity, unspecified: Secondary | ICD-10-CM | POA: Diagnosis not present

## 2020-11-12 DIAGNOSIS — M7989 Other specified soft tissue disorders: Secondary | ICD-10-CM | POA: Diagnosis not present

## 2020-11-12 DIAGNOSIS — J90 Pleural effusion, not elsewhere classified: Secondary | ICD-10-CM | POA: Diagnosis not present

## 2020-11-12 DIAGNOSIS — R112 Nausea with vomiting, unspecified: Secondary | ICD-10-CM

## 2020-11-12 DIAGNOSIS — Z01818 Encounter for other preprocedural examination: Secondary | ICD-10-CM | POA: Diagnosis not present

## 2020-11-12 DIAGNOSIS — I252 Old myocardial infarction: Secondary | ICD-10-CM

## 2020-11-12 DIAGNOSIS — S2242XA Multiple fractures of ribs, left side, initial encounter for closed fracture: Secondary | ICD-10-CM | POA: Diagnosis not present

## 2020-11-12 DIAGNOSIS — K3189 Other diseases of stomach and duodenum: Secondary | ICD-10-CM | POA: Diagnosis not present

## 2020-11-12 DIAGNOSIS — I5020 Unspecified systolic (congestive) heart failure: Secondary | ICD-10-CM | POA: Diagnosis not present

## 2020-11-12 DIAGNOSIS — E6609 Other obesity due to excess calories: Secondary | ICD-10-CM

## 2020-11-12 DIAGNOSIS — I255 Ischemic cardiomyopathy: Secondary | ICD-10-CM | POA: Diagnosis present

## 2020-11-12 DIAGNOSIS — R7989 Other specified abnormal findings of blood chemistry: Secondary | ICD-10-CM | POA: Diagnosis not present

## 2020-11-12 DIAGNOSIS — E119 Type 2 diabetes mellitus without complications: Secondary | ICD-10-CM | POA: Diagnosis not present

## 2020-11-12 HISTORY — DX: Unspecified dementia, unspecified severity, without behavioral disturbance, psychotic disturbance, mood disturbance, and anxiety: F03.90

## 2020-11-12 LAB — COMPREHENSIVE METABOLIC PANEL
ALT: 82 U/L — ABNORMAL HIGH (ref 0–44)
AST: 77 U/L — ABNORMAL HIGH (ref 15–41)
Albumin: 3.2 g/dL — ABNORMAL LOW (ref 3.5–5.0)
Alkaline Phosphatase: 223 U/L — ABNORMAL HIGH (ref 38–126)
Anion gap: 10 (ref 5–15)
BUN: 30 mg/dL — ABNORMAL HIGH (ref 8–23)
CO2: 23 mmol/L (ref 22–32)
Calcium: 8.7 mg/dL — ABNORMAL LOW (ref 8.9–10.3)
Chloride: 93 mmol/L — ABNORMAL LOW (ref 98–111)
Creatinine, Ser: 1.11 mg/dL (ref 0.61–1.24)
GFR, Estimated: >60 mL/min (ref >60)
Glucose, Bld: 139 mg/dL — ABNORMAL HIGH (ref 70–99)
Potassium: 4.2 mmol/L (ref 3.5–5.1)
Sodium: 126 mmol/L — ABNORMAL LOW (ref 135–145)
Total Bilirubin: 0.3 mg/dL (ref 0.3–1.2)
Total Protein: 6.7 g/dL (ref 6.5–8.1)

## 2020-11-12 LAB — CBC WITH DIFFERENTIAL/PLATELET
Abs Immature Granulocytes: 0.21 10*3/uL — ABNORMAL HIGH (ref 0.00–0.07)
Basophils Absolute: 0.1 10*3/uL (ref 0.0–0.1)
Basophils Relative: 0 %
Eosinophils Absolute: 0.1 10*3/uL (ref 0.0–0.5)
Eosinophils Relative: 1 %
HCT: 38.1 % — ABNORMAL LOW (ref 39.0–52.0)
Hemoglobin: 13.2 g/dL (ref 13.0–17.0)
Immature Granulocytes: 1 %
Lymphocytes Relative: 4 %
Lymphs Abs: 0.7 10*3/uL (ref 0.7–4.0)
MCH: 32.4 pg (ref 26.0–34.0)
MCHC: 34.6 g/dL (ref 30.0–36.0)
MCV: 93.6 fL (ref 80.0–100.0)
Monocytes Absolute: 1.5 10*3/uL — ABNORMAL HIGH (ref 0.1–1.0)
Monocytes Relative: 9 %
Neutro Abs: 13.2 10*3/uL — ABNORMAL HIGH (ref 1.7–7.7)
Neutrophils Relative %: 85 %
Platelets: 217 10*3/uL (ref 150–400)
RBC: 4.07 MIL/uL — ABNORMAL LOW (ref 4.22–5.81)
RDW: 13.1 % (ref 11.5–15.5)
WBC: 15.6 10*3/uL — ABNORMAL HIGH (ref 4.0–10.5)
nRBC: 0 % (ref 0.0–0.2)

## 2020-11-12 LAB — URINALYSIS, ROUTINE W REFLEX MICROSCOPIC
Bilirubin Urine: NEGATIVE
Glucose, UA: NEGATIVE mg/dL
Hgb urine dipstick: NEGATIVE
Ketones, ur: NEGATIVE mg/dL
Leukocytes,Ua: NEGATIVE
Nitrite: NEGATIVE
Protein, ur: NEGATIVE mg/dL
Specific Gravity, Urine: 1.015 (ref 1.005–1.030)
pH: 5 (ref 5.0–8.0)

## 2020-11-12 LAB — LACTIC ACID, PLASMA: Lactic Acid, Venous: 1.1 mmol/L (ref 0.5–1.9)

## 2020-11-12 MED ORDER — SODIUM CHLORIDE 0.9 % IV SOLN
2.0000 g | Freq: Once | INTRAVENOUS | Status: AC
  Administered 2020-11-13: 2 g via INTRAVENOUS
  Filled 2020-11-12: qty 20

## 2020-11-12 MED ORDER — HYDROMORPHONE HCL 1 MG/ML IJ SOLN
0.5000 mg | Freq: Once | INTRAMUSCULAR | Status: AC
  Administered 2020-11-12: 0.5 mg via INTRAVENOUS
  Filled 2020-11-12: qty 1

## 2020-11-12 NOTE — ED Triage Notes (Signed)
Pt c/o redness/swelling to right LE x 3 days-states he has been at [facility] and [facility] ED for same-NAD-to triage in w/c

## 2020-11-12 NOTE — ED Provider Notes (Signed)
[place] EMERGENCY DEPARTMENT Provider Note   CSN: [id number] Arrival date & time: 11/12/20  1738     History Chief Complaint  Patient presents with  . Leg Swelling    Jeremy Hickman is a 70 y.o. male.  HPI Patient presents with pain and swelling of right lower leg.  Has had for around 3 days.  Seen at urgent care and then [facility].  States he was started on antibiotics at the urgent care.  States neither place gave him good medical care.  States leg is become more swollen.  More painful.  Worse even after the antibiotics.  States he has a history of swelling in the right will often swell more than the left but usually not swollen.  He is on blood thinners.  More redness and pain however on the lower leg.  Walks with a walker at baseline.  He is diabetic.  Had outpatient Doppler done that I reviewed the results of and was negative for DVT.    Past Medical History:  Diagnosis Date  . Diabetes mellitus without complication ([place])   . Hypertension     Patient Active Problem List   Diagnosis Date Noted  . Coronary artery disease involving native coronary artery of native heart without angina pectoris 11/01/2018  . DOE (dyspnea on exertion) 11/01/2018  . Old MI (myocardial infarction) 11/01/2018  . Gait difficulty 03/01/2018  . Sebaceous cyst 02/28/2018  . Bilateral lower extremity edema 02/06/2018  . Obesity (BMI 30.0-34.9) 02/06/2018  . Seasonal allergic rhinitis due to pollen 01/20/2016  . Cognitive complaints 11/06/2014  . Diabetic peripheral neuropathy ([place]) 11/06/2014  . Essential hypertension 12/03/2013  . Hyperlipidemia 12/03/2013  . Type 1 diabetes mellitus with hypoglycemia ([place]) 12/03/2013  . Osteoarthrosis 05/23/2013  . Insomnia 03/17/2013    History reviewed. No pertinent surgical history.     No family history on file.  Social History   Tobacco Use  . Smoking status: Former Research scientist (life sciences)  . Smokeless tobacco: Never Used  Substance Use  Topics  . Alcohol use: Yes    Comment: weekly  . Drug use: Never    Home Medications Prior to Admission medications   Medication Sig Start Date End Date Taking? Authorizing Provider  aspirin EC 81 MG tablet Take by mouth. 10/22/10   [provider]  Blood Glucose Monitoring Suppl (FIFTY50 GLUCOSE METER 2.0) w/Device KIT Use as instructed 04/12/17   [provider]  carvedilol (COREG) 6.25 MG tablet Take by mouth 2 (two) times daily with a meal.  02/04/17   [provider]  cetirizine (ZYRTEC) 10 MG tablet Take 10 mg by mouth daily.  03/18/17   [provider]  fenofibrate micronized (ANTARA) 130 MG capsule Take 1 tablet daily 09/19/18   [provider]  Flax Oil-Fish Oil-Borage Oil CAPS Take 1,000 capsules by mouth daily.  05/02/13   [provider]  FLUoxetine (PROZAC) 20 MG capsule TAKE 1 CAPSULE BY MOUTH ONCE DAILY 03/28/17   [provider]  furosemide (LASIX) 20 MG tablet Take 20 mg by mouth 2 (two) times daily. Takes 38m in the morning and 259min the afternoon 05/30/17   [provider]  insulin glargine (LANTUS) 100 UNIT/ML injection Inject 24 Units into the skin daily.    [provider]  insulin regular (NOVOLIN R,HUMULIN R) 100 units/mL injection Inject 25 Units into the skin 3 (three) times daily before meals.    [provider]  Melatonin 3 MG CAPS  Take 1 capsule by mouth at bedtime.     [provider]  Multiple Vitamin (MULTI-VITAMINS) TABS Take 1 tablet by mouth daily.  05/02/13   [provider]  traZODone (DESYREL) 50 MG tablet Take 50 mg by mouth at bedtime.    [provider]    Allergies    Iodinated diagnostic agents  Review of Systems   Review of Systems  Constitutional: Positive for fatigue. Negative for activity change and fever.  HENT: Negative for congestion.   Respiratory: Negative for choking and shortness of breath.   Cardiovascular: Positive for  leg swelling. Negative for chest pain and palpitations.  Gastrointestinal: Negative for abdominal pain.  Musculoskeletal: Negative for back pain.  Skin: Positive for color change and wound.  Neurological: Negative for weakness.  Psychiatric/Behavioral: Negative for confusion.    Physical Exam Updated Vital Signs BP (!) 158/78 (BP Location: Left Arm)   Pulse 89   Temp 98 F (36.7 C) (Oral)   Resp 20   SpO2 96%   Physical Exam Vitals and nursing note reviewed.  HENT:     Mouth/Throat:     Mouth: Mucous membranes are moist.  Eyes:     Pupils: Pupils are equal, round, and reactive to light.  Cardiovascular:     Rate and Rhythm: Regular rhythm.  Pulmonary:     Breath sounds: No wheezing, rhonchi or rales.  Abdominal:     Tenderness: There is no abdominal tenderness.  Musculoskeletal:     Cervical back: Neck supple.     Right lower leg: Edema present.     Left lower leg: Edema present.     Comments: Mild pitting of left lower extremity but moderate to severe pitting edema right lower leg and up to thigh and right.  There is erythema from the lower leg progressing up to the thigh also.  Skin:    General: Skin is warm.     Capillary Refill: Capillary refill takes less than 2 seconds.  Neurological:     Mental Status: He is alert and oriented to person, place, and time.         ED Results / Procedures / Treatments   Labs (all labs ordered are listed, but only abnormal results are displayed) Labs Reviewed  CBC WITH DIFFERENTIAL/PLATELET - Abnormal; Notable for the following components:      Result Value   WBC 15.6 (*)    RBC 4.07 (*)    HCT 38.1 (*)    Neutro Abs 13.2 (*)    Monocytes Absolute 1.5 (*)    Abs Immature Granulocytes 0.21 (*)    All other components within normal limits  COMPREHENSIVE METABOLIC PANEL - Abnormal; Notable for the following components:   Sodium 126 (*)    Chloride 93 (*)    Glucose, Bld 139 (*)    BUN 30 (*)    Calcium 8.7 (*)     Albumin 3.2 (*)    AST 77 (*)    ALT 82 (*)    Alkaline Phosphatase 223 (*)    All other components within normal limits  CULTURE, BLOOD (ROUTINE X 2)  CULTURE, BLOOD (ROUTINE X 2)  SARS CORONAVIRUS 2 (TAT 6-24 HRS)  URINALYSIS, ROUTINE W REFLEX MICROSCOPIC  LACTIC ACID, PLASMA  LACTIC ACID, PLASMA    EKG None  Radiology DG Chest Portable 1 View  Result Date: 11/12/2020 CLINICAL DATA:  70 year old male with leg swelling. EXAM: PORTABLE CHEST 1 VIEW COMPARISON:  Chest CT dated 02/01/2020. FINDINGS:  Bibasilar linear atelectasis/scarring. No focal consolidation, pleural effusion or pneumothorax. Mild cardiomegaly. Atherosclerotic calcification of the aorta. No acute osseous pathology. IMPRESSION: No active disease. Electronically Signed   By: Anner Crete M.D.   On: 11/12/2020 22:02    Procedures Procedures   Medications Ordered in ED Medications  HYDROmorphone (DILAUDID) injection 0.5 mg (0.5 mg Intravenous Given 11/12/20 2215)    ED Course  I have reviewed the triage vital signs and the nursing notes.  Pertinent labs & imaging results that were available during my care of the patient were reviewed by me and considered in my medical decision making (see chart for details).    MDM Rules/Calculators/A&P                         Patient presents with pain and swelling of his right lower extremity. Negative doppler 2 days ago. Also has been on clinda, but has had worsening pain and swelling. WBC increased from 11 to 15 on abx. Diabetic. Normal lactic acid. With failure of outpatient antibiotics and worsing WBC and redness will require admission to hospital. Will discuss with hospitalist. Does not appear septic. Final Clinical Impression(s) / ED Diagnoses Final diagnoses:  Leg swelling  Cellulitis of right lower extremity    Rx / DC Orders ED Discharge Orders    None       Davonna Belling, MD 11/12/20 2306

## 2020-11-12 NOTE — Progress Notes (Signed)
Admission requested from [place] ED  Signout per Dr. Davonna Belling.  Patient is a 70 year old with history of diabetes presenting with right lower extremity swelling/erythema for the past 4 days.  He had a Doppler done during an urgent care visit a few days ago which was negative and was started on clindamycin but swelling/erythema now worse.  Does have leukocytosis on labs.  No lactic acidosis.  Vitals are stable.  Labs also showing low sodium.  Patient is being started on ceftriaxone.  Admission requested for management of right lower extremity cellulitis which has failed outpatient antibiotic therapy.  [facility] will assume care on arrival to accepting facility. Until arrival, care as per EDP. However, [facility] available 24/7 for questions and assistance.

## 2020-11-13 ENCOUNTER — Encounter (HOSPITAL_COMMUNITY): Payer: Self-pay | Admitting: Internal Medicine

## 2020-11-13 DIAGNOSIS — Z6836 Body mass index (BMI) 36.0-36.9, adult: Secondary | ICD-10-CM

## 2020-11-13 DIAGNOSIS — I5020 Unspecified systolic (congestive) heart failure: Secondary | ICD-10-CM | POA: Diagnosis not present

## 2020-11-13 DIAGNOSIS — G4733 Obstructive sleep apnea (adult) (pediatric): Secondary | ICD-10-CM | POA: Diagnosis present

## 2020-11-13 DIAGNOSIS — I214 Non-ST elevation (NSTEMI) myocardial infarction: Secondary | ICD-10-CM | POA: Diagnosis not present

## 2020-11-13 DIAGNOSIS — L0390 Cellulitis, unspecified: Secondary | ICD-10-CM | POA: Diagnosis present

## 2020-11-13 DIAGNOSIS — E119 Type 2 diabetes mellitus without complications: Secondary | ICD-10-CM | POA: Diagnosis not present

## 2020-11-13 DIAGNOSIS — I252 Old myocardial infarction: Secondary | ICD-10-CM | POA: Diagnosis not present

## 2020-11-13 DIAGNOSIS — E669 Obesity, unspecified: Secondary | ICD-10-CM

## 2020-11-13 DIAGNOSIS — E6609 Other obesity due to excess calories: Secondary | ICD-10-CM

## 2020-11-13 DIAGNOSIS — Z7982 Long term (current) use of aspirin: Secondary | ICD-10-CM | POA: Diagnosis not present

## 2020-11-13 DIAGNOSIS — E1169 Type 2 diabetes mellitus with other specified complication: Secondary | ICD-10-CM | POA: Diagnosis not present

## 2020-11-13 DIAGNOSIS — F39 Unspecified mood [affective] disorder: Secondary | ICD-10-CM | POA: Diagnosis present

## 2020-11-13 DIAGNOSIS — I5043 Acute on chronic combined systolic (congestive) and diastolic (congestive) heart failure: Secondary | ICD-10-CM | POA: Diagnosis present

## 2020-11-13 DIAGNOSIS — D62 Acute posthemorrhagic anemia: Secondary | ICD-10-CM | POA: Diagnosis not present

## 2020-11-13 DIAGNOSIS — L03115 Cellulitis of right lower limb: Secondary | ICD-10-CM

## 2020-11-13 DIAGNOSIS — F0390 Unspecified dementia without behavioral disturbance: Secondary | ICD-10-CM | POA: Diagnosis present

## 2020-11-13 DIAGNOSIS — I10 Essential (primary) hypertension: Secondary | ICD-10-CM

## 2020-11-13 DIAGNOSIS — R419 Unspecified symptoms and signs involving cognitive functions and awareness: Secondary | ICD-10-CM | POA: Diagnosis not present

## 2020-11-13 DIAGNOSIS — E871 Hypo-osmolality and hyponatremia: Secondary | ICD-10-CM | POA: Diagnosis present

## 2020-11-13 DIAGNOSIS — Z951 Presence of aortocoronary bypass graft: Secondary | ICD-10-CM | POA: Diagnosis not present

## 2020-11-13 DIAGNOSIS — Z9989 Dependence on other enabling machines and devices: Secondary | ICD-10-CM | POA: Diagnosis not present

## 2020-11-13 DIAGNOSIS — Z87891 Personal history of nicotine dependence: Secondary | ICD-10-CM | POA: Diagnosis not present

## 2020-11-13 DIAGNOSIS — G4700 Insomnia, unspecified: Secondary | ICD-10-CM | POA: Diagnosis present

## 2020-11-13 DIAGNOSIS — L02415 Cutaneous abscess of right lower limb: Secondary | ICD-10-CM

## 2020-11-13 DIAGNOSIS — I340 Nonrheumatic mitral (valve) insufficiency: Secondary | ICD-10-CM | POA: Diagnosis not present

## 2020-11-13 DIAGNOSIS — I2510 Atherosclerotic heart disease of native coronary artery without angina pectoris: Secondary | ICD-10-CM | POA: Diagnosis present

## 2020-11-13 DIAGNOSIS — R0602 Shortness of breath: Secondary | ICD-10-CM | POA: Diagnosis not present

## 2020-11-13 DIAGNOSIS — I25110 Atherosclerotic heart disease of native coronary artery with unstable angina pectoris: Secondary | ICD-10-CM | POA: Diagnosis not present

## 2020-11-13 DIAGNOSIS — Z955 Presence of coronary angioplasty implant and graft: Secondary | ICD-10-CM | POA: Diagnosis not present

## 2020-11-13 DIAGNOSIS — Z66 Do not resuscitate: Secondary | ICD-10-CM | POA: Diagnosis present

## 2020-11-13 DIAGNOSIS — Z515 Encounter for palliative care: Secondary | ICD-10-CM | POA: Diagnosis not present

## 2020-11-13 DIAGNOSIS — Z20822 Contact with and (suspected) exposure to covid-19: Secondary | ICD-10-CM | POA: Diagnosis present

## 2020-11-13 DIAGNOSIS — R0600 Dyspnea, unspecified: Secondary | ICD-10-CM | POA: Diagnosis not present

## 2020-11-13 DIAGNOSIS — I509 Heart failure, unspecified: Secondary | ICD-10-CM | POA: Diagnosis not present

## 2020-11-13 DIAGNOSIS — E1065 Type 1 diabetes mellitus with hyperglycemia: Secondary | ICD-10-CM | POA: Diagnosis present

## 2020-11-13 DIAGNOSIS — I110 Hypertensive heart disease with heart failure: Secondary | ICD-10-CM | POA: Diagnosis present

## 2020-11-13 DIAGNOSIS — L03116 Cellulitis of left lower limb: Secondary | ICD-10-CM | POA: Diagnosis not present

## 2020-11-13 DIAGNOSIS — Z794 Long term (current) use of insulin: Secondary | ICD-10-CM | POA: Diagnosis not present

## 2020-11-13 DIAGNOSIS — Z79899 Other long term (current) drug therapy: Secondary | ICD-10-CM | POA: Diagnosis not present

## 2020-11-13 DIAGNOSIS — F419 Anxiety disorder, unspecified: Secondary | ICD-10-CM | POA: Diagnosis present

## 2020-11-13 DIAGNOSIS — E785 Hyperlipidemia, unspecified: Secondary | ICD-10-CM | POA: Diagnosis present

## 2020-11-13 DIAGNOSIS — Z01810 Encounter for preprocedural cardiovascular examination: Secondary | ICD-10-CM | POA: Diagnosis not present

## 2020-11-13 DIAGNOSIS — Z7189 Other specified counseling: Secondary | ICD-10-CM | POA: Diagnosis not present

## 2020-11-13 HISTORY — DX: Other obesity due to excess calories: E66.09

## 2020-11-13 HISTORY — DX: Cellulitis of right lower limb: L03.115

## 2020-11-13 HISTORY — DX: Cutaneous abscess of right lower limb: L02.415

## 2020-11-13 LAB — GLUCOSE, CAPILLARY
Glucose-Capillary: 123 mg/dL — ABNORMAL HIGH (ref 70–99)
Glucose-Capillary: 153 mg/dL — ABNORMAL HIGH (ref 70–99)
Glucose-Capillary: 263 mg/dL — ABNORMAL HIGH (ref 70–99)

## 2020-11-13 LAB — SARS CORONAVIRUS 2 (TAT 6-24 HRS): SARS Coronavirus 2: NEGATIVE

## 2020-11-13 LAB — HEMOGLOBIN A1C
Hgb A1c MFr Bld: 6.7 % — ABNORMAL HIGH (ref 4.8–5.6)
Mean Plasma Glucose: 145.59 mg/dL

## 2020-11-13 LAB — LACTIC ACID, PLASMA: Lactic Acid, Venous: 0.8 mmol/L (ref 0.5–1.9)

## 2020-11-13 LAB — TSH: TSH: 1.931 u[IU]/mL (ref 0.350–4.500)

## 2020-11-13 MED ORDER — TRAZODONE HCL 50 MG PO TABS: 50.0000 mg | ORAL_TABLET | Freq: Every day | ORAL | Status: DC

## 2020-11-13 MED ORDER — ENOXAPARIN SODIUM 60 MG/0.6ML ~~LOC~~ SOLN
50.0000 mg | SUBCUTANEOUS | Status: DC
  Administered 2020-11-13 – 2020-11-14 (×2): 50 mg via SUBCUTANEOUS
  Filled 2020-11-13 (×2): qty 0.6

## 2020-11-13 MED ORDER — SODIUM CHLORIDE 0.9 % IV SOLN
INTRAVENOUS | Status: DC | PRN
  Administered 2020-11-13: 1000 mL via INTRAVENOUS

## 2020-11-13 MED ORDER — MORPHINE SULFATE (PF) 2 MG/ML IV SOLN
2.0000 mg | INTRAVENOUS | Status: DC | PRN
Start: 2020-11-13 — End: 2020-11-21
  Administered 2020-11-13: 2 mg via INTRAVENOUS
  Filled 2020-11-13: qty 1

## 2020-11-13 MED ORDER — SODIUM CHLORIDE 0.9 % IV SOLN: INTRAVENOUS | Status: DC

## 2020-11-13 MED ORDER — POLYETHYLENE GLYCOL 3350 17 G PO PACK: 17.0000 g | PACK | Freq: Every day | ORAL | Status: DC | PRN

## 2020-11-13 MED ORDER — JUVEN PO PACK
1.0000 | PACK | Freq: Two times a day (BID) | ORAL | Status: DC
  Administered 2020-11-15 – 2020-11-27 (×16): 1 via ORAL
  Filled 2020-11-13 (×15): qty 1

## 2020-11-13 MED ORDER — TERAZOSIN HCL 5 MG PO CAPS
5.0000 mg | ORAL_CAPSULE | Freq: Every day | ORAL | Status: DC
  Administered 2020-11-14 – 2020-11-26 (×13): 5 mg via ORAL
  Filled 2020-11-13 (×19): qty 1

## 2020-11-13 MED ORDER — HYDRALAZINE HCL 20 MG/ML IJ SOLN: 5.0000 mg | INTRAMUSCULAR | Status: DC | PRN

## 2020-11-13 MED ORDER — ALBUTEROL SULFATE (2.5 MG/3ML) 0.083% IN NEBU
2.5000 mg | INHALATION_SOLUTION | RESPIRATORY_TRACT | Status: DC | PRN
  Administered 2020-11-13 – 2020-11-14 (×2): 2.5 mg via RESPIRATORY_TRACT
  Filled 2020-11-13 (×2): qty 3

## 2020-11-13 MED ORDER — HYDROMORPHONE HCL 1 MG/ML IJ SOLN
0.5000 mg | Freq: Once | INTRAMUSCULAR | Status: AC
  Administered 2020-11-13: 0.5 mg via INTRAVENOUS
  Filled 2020-11-13: qty 1

## 2020-11-13 MED ORDER — ACETAMINOPHEN 325 MG PO TABS
650.0000 mg | ORAL_TABLET | Freq: Four times a day (QID) | ORAL | Status: DC | PRN
  Administered 2020-11-16 – 2020-11-19 (×3): 650 mg via ORAL
  Filled 2020-11-13 (×3): qty 2

## 2020-11-13 MED ORDER — SODIUM CHLORIDE 0.9 % IV SOLN: 2.0000 g | Freq: Once | INTRAVENOUS | Status: DC

## 2020-11-13 MED ORDER — VANCOMYCIN HCL 10 G IV SOLR
2250.0000 mg | Freq: Once | INTRAVENOUS | Status: AC
  Administered 2020-11-13: 2250 mg via INTRAVENOUS
  Filled 2020-11-13: qty 2000

## 2020-11-13 MED ORDER — LORATADINE 10 MG PO TABS
10.0000 mg | ORAL_TABLET | Freq: Every day | ORAL | Status: DC
  Administered 2020-11-13 – 2020-11-27 (×13): 10 mg via ORAL
  Filled 2020-11-13 (×13): qty 1

## 2020-11-13 MED ORDER — VANCOMYCIN HCL 1500 MG/300ML IV SOLN
1500.0000 mg | INTRAVENOUS | Status: DC
  Administered 2020-11-14 – 2020-11-16 (×3): 1500 mg via INTRAVENOUS
  Filled 2020-11-13 (×3): qty 300

## 2020-11-13 MED ORDER — ASPIRIN EC 81 MG PO TBEC
81.0000 mg | DELAYED_RELEASE_TABLET | Freq: Every day | ORAL | Status: DC
  Administered 2020-11-13 – 2020-11-17 (×5): 81 mg via ORAL
  Filled 2020-11-13 (×5): qty 1

## 2020-11-13 MED ORDER — DONEPEZIL HCL 5 MG PO TABS
5.0000 mg | ORAL_TABLET | Freq: Every day | ORAL | Status: DC
  Administered 2020-11-13 – 2020-11-26 (×13): 5 mg via ORAL
  Filled 2020-11-13 (×15): qty 1

## 2020-11-13 MED ORDER — BUSPIRONE HCL 10 MG PO TABS
30.0000 mg | ORAL_TABLET | Freq: Every day | ORAL | Status: DC
  Administered 2020-11-15 – 2020-11-27 (×12): 30 mg via ORAL
  Filled 2020-11-13 (×2): qty 3
  Filled 2020-11-13 (×2): qty 2
  Filled 2020-11-13 (×8): qty 3

## 2020-11-13 MED ORDER — ACETAMINOPHEN 650 MG RE SUPP: 650.0000 mg | Freq: Four times a day (QID) | RECTAL | Status: DC | PRN

## 2020-11-13 MED ORDER — HYDROMORPHONE HCL 1 MG/ML IJ SOLN
0.5000 mg | INTRAMUSCULAR | Status: DC | PRN
  Administered 2020-11-13 (×2): 0.5 mg via INTRAVENOUS
  Filled 2020-11-13 (×2): qty 1

## 2020-11-13 MED ORDER — DOCUSATE SODIUM 100 MG PO CAPS
100.0000 mg | ORAL_CAPSULE | Freq: Two times a day (BID) | ORAL | Status: DC
  Administered 2020-11-13 – 2020-11-19 (×12): 100 mg via ORAL
  Filled 2020-11-13 (×14): qty 1

## 2020-11-13 MED ORDER — ADULT MULTIVITAMIN W/MINERALS CH
1.0000 | ORAL_TABLET | Freq: Every day | ORAL | Status: DC
  Administered 2020-11-13 – 2020-11-20 (×7): 1 via ORAL
  Filled 2020-11-13 (×7): qty 1

## 2020-11-13 MED ORDER — ONDANSETRON HCL 4 MG/2ML IJ SOLN
4.0000 mg | Freq: Four times a day (QID) | INTRAMUSCULAR | Status: DC | PRN
  Administered 2020-11-13 – 2020-11-14 (×2): 4 mg via INTRAVENOUS
  Filled 2020-11-13 (×2): qty 2

## 2020-11-13 MED ORDER — BUSPIRONE HCL 5 MG PO TABS
15.0000 mg | ORAL_TABLET | Freq: Every evening | ORAL | Status: DC
  Administered 2020-11-13 – 2020-11-26 (×13): 15 mg via ORAL
  Filled 2020-11-13 (×4): qty 1
  Filled 2020-11-13: qty 2
  Filled 2020-11-13: qty 1
  Filled 2020-11-13 (×4): qty 2
  Filled 2020-11-13 (×2): qty 1
  Filled 2020-11-13: qty 2

## 2020-11-13 MED ORDER — SODIUM CHLORIDE 0.9 % IV SOLN
1.0000 g | INTRAVENOUS | Status: DC
  Filled 2020-11-13: qty 10

## 2020-11-13 MED ORDER — SODIUM CHLORIDE 0.9 % IV SOLN
2.0000 g | Freq: Three times a day (TID) | INTRAVENOUS | Status: DC
  Administered 2020-11-13 – 2020-11-15 (×5): 2 g via INTRAVENOUS
  Filled 2020-11-13 (×5): qty 2

## 2020-11-13 MED ORDER — ONDANSETRON HCL 4 MG PO TABS: 4.0000 mg | ORAL_TABLET | Freq: Four times a day (QID) | ORAL | Status: DC | PRN

## 2020-11-13 MED ORDER — INSULIN ASPART 100 UNIT/ML ~~LOC~~ SOLN
0.0000 [IU] | Freq: Three times a day (TID) | SUBCUTANEOUS | Status: DC
  Administered 2020-11-13: 3 [IU] via SUBCUTANEOUS

## 2020-11-13 MED ORDER — ENSURE MAX PROTEIN PO LIQD
11.0000 [oz_av] | Freq: Every day | ORAL | Status: DC
  Administered 2020-11-19 – 2020-11-26 (×6): 11 [oz_av] via ORAL
  Filled 2020-11-13 (×15): qty 330

## 2020-11-13 MED ORDER — TEMAZEPAM 15 MG PO CAPS
15.0000 mg | ORAL_CAPSULE | Freq: Every evening | ORAL | Status: DC | PRN
  Administered 2020-11-13 – 2020-11-26 (×9): 15 mg via ORAL
  Filled 2020-11-13 (×9): qty 1

## 2020-11-13 MED ORDER — EZETIMIBE 10 MG PO TABS
10.0000 mg | ORAL_TABLET | Freq: Every day | ORAL | Status: DC
  Administered 2020-11-13 – 2020-11-27 (×13): 10 mg via ORAL
  Filled 2020-11-13 (×13): qty 1

## 2020-11-13 MED ORDER — INSULIN ASPART 100 UNIT/ML ~~LOC~~ SOLN
0.0000 [IU] | Freq: Every day | SUBCUTANEOUS | Status: DC
  Administered 2020-11-13: 3 [IU] via SUBCUTANEOUS

## 2020-11-13 MED ORDER — SODIUM CHLORIDE 0.9% FLUSH
3.0000 mL | Freq: Two times a day (BID) | INTRAVENOUS | Status: DC
  Administered 2020-11-13 – 2020-11-18 (×7): 3 mL via INTRAVENOUS

## 2020-11-13 MED ORDER — FUROSEMIDE 20 MG PO TABS
20.0000 mg | ORAL_TABLET | Freq: Every evening | ORAL | Status: DC
  Administered 2020-11-13: 20 mg via ORAL
  Filled 2020-11-13 (×2): qty 1

## 2020-11-13 MED ORDER — HYDROCODONE-ACETAMINOPHEN 5-325 MG PO TABS
1.0000 | ORAL_TABLET | ORAL | Status: DC | PRN
  Administered 2020-11-13 – 2020-11-20 (×3): 2 via ORAL
  Filled 2020-11-13 (×3): qty 2

## 2020-11-13 MED ORDER — FENOFIBRATE 160 MG PO TABS: 160.0000 mg | ORAL_TABLET | Freq: Every day | ORAL | Status: DC

## 2020-11-13 MED ORDER — FUROSEMIDE 20 MG PO TABS
40.0000 mg | ORAL_TABLET | Freq: Every morning | ORAL | Status: DC
  Filled 2020-11-13: qty 2

## 2020-11-13 MED ORDER — MELATONIN 3 MG PO TABS
3.0000 mg | ORAL_TABLET | Freq: Every day | ORAL | Status: DC
  Administered 2020-11-13 – 2020-11-19 (×6): 3 mg via ORAL
  Filled 2020-11-13 (×7): qty 1

## 2020-11-13 MED ORDER — BISACODYL 5 MG PO TBEC: 5.0000 mg | DELAYED_RELEASE_TABLET | Freq: Every day | ORAL | Status: DC | PRN

## 2020-11-13 MED ORDER — INSULIN GLARGINE 100 UNIT/ML ~~LOC~~ SOLN
24.0000 [IU] | Freq: Every day | SUBCUTANEOUS | Status: DC
  Administered 2020-11-13 – 2020-11-14 (×2): 24 [IU] via SUBCUTANEOUS
  Filled 2020-11-13 (×3): qty 0.24

## 2020-11-13 MED ORDER — FLUOXETINE HCL 20 MG PO CAPS
20.0000 mg | ORAL_CAPSULE | Freq: Every day | ORAL | Status: DC
  Administered 2020-11-13 – 2020-11-27 (×13): 20 mg via ORAL
  Filled 2020-11-13 (×14): qty 1

## 2020-11-13 MED ORDER — CARVEDILOL 6.25 MG PO TABS
6.2500 mg | ORAL_TABLET | Freq: Two times a day (BID) | ORAL | Status: DC
  Administered 2020-11-13: 6.25 mg via ORAL
  Filled 2020-11-13 (×3): qty 1

## 2020-11-13 NOTE — Progress Notes (Signed)
Pharmacy Antibiotic Note  Jeremy Hickman is a 70 y.o. male admitted on 11/12/2020 with RLE swelling/erythema.  Pharmacy has been consulted for vancomycin and cefepime dosing for cellulitis.  WBC 15.6, afebrile; Scr 1.11, CrCl 73 ml/min  Plan: Vancomycin 2250 mg IV X 1, followed by vancomycin 1500 mg IV Q 24 hrs (estimated vancomycin AUC, using Scr 1.11, is 516.7; goal vancomycin AUC is 400-550) Cefepime 2 gm IV Q 8 hrs Monitor WBC, temp, clinical improvement, renal function, vancomycin levels as indicated  Weight: 109.8 kg (242 lb)  Temp (24hrs), Avg:98.2 F (36.8 C), Min:98 F (36.7 C), Max:98.6 F (37 C)  Recent Labs  Lab 11/12/20 2142 11/12/20 2144 11/13/20 0135  WBC  --  15.6*  --   CREATININE  --  1.11  --   LATICACIDVEN 1.1  --  0.8    CrCl cannot be calculated (Unknown ideal weight.).    Allergies  Allergen Reactions  . Iodinated Diagnostic Agents Hives    Antimicrobials this admission: Ceftriaxone X 1: 2/3 Cefepime: 2/3 >> Vancomycin 2/3 >>  Microbiology results: 2/2 BCx X 2: pending 2/2 COVID: negative  Thank you for allowing pharmacy to be a part of this patient's care.  Gillermina Hu, PharmD, BCPS, [facility] Clinical Pharmacist 11/13/2020 3:25 PM

## 2020-11-13 NOTE — ED Notes (Signed)
Report given to Legrand Como [facility]).

## 2020-11-13 NOTE — H&P (Signed)
History and Physical    Jeremy Hickman:[id number] DOB: [birth year] DOA: 11/12/2020  PCP: Drosinis, [name], PA-C Consultants:  Atilano Median - cardiology; Sabra Heck - neurology; Aida Puffer - podiatry; Younts - endocrinology; Alroy Dust - surgery Patient coming from:  Home - lives with Jeremy Hickman; NOK: Jeremy Hickman, Jeremy Hickman, [phone]  Chief Complaint: RLE edema, infection  HPI: Jeremy Hickman is a 70 y.o. male with medical history significant of HTN and DM presenting with RLE cellulitis.  He reports RLE edema.  He thought it was fluid but now he thinks it might need amputation.  He went to urgent care last Sunday and was scheduled for DVT US.  He was told he needed to go to the emergency room.  He went to [facility] ER and he left.  Then he went to [facility] and was there all last night without assistance (he is very upset about his experience there).  He does feel like it has gotten better with the antibiotics since then.  Denies trauma to the area.  He did have a cut between his first and second toes fairly recently but that healed quickly. No h/o cellulitis.  Denies fever.  Used a walker at first due to discomfort but currently unable to bear weight at all.  The leg has been weeping.    ED Course:  [facility] to [facility] transfer, as per Dr. Marlowe Sax:  Patient is a 70 year old with history of diabetes presenting with right lower extremity swelling/erythema for the past 4 days.  He had a Doppler done during an urgent care visit a few days ago which was negative and was started on clindamycin but swelling/erythema now worse.  Does have leukocytosis on labs.  No lactic acidosis.  Vitals are stable.  Labs also showing low sodium.  Patient is being started on ceftriaxone.  Admission requested for management of right lower extremity cellulitis which has failed outpatient antibiotic therapy.   Review of Systems: As per HPI; otherwise review of systems reviewed and negative.   Ambulatory Status:  Ambulates without assistance  COVID  Vaccine Status:  None  Past Medical History:  Diagnosis Date  . Class 2 obesity due to excess calories with body mass index (BMI) of 36.0 to 36.9 in adult 11/13/2020  . Dementia ([place])   . Diabetes mellitus without complication ([place])   . Hypertension     History reviewed. No pertinent surgical history.  Social History   Socioeconomic History  . Marital status: Married    Spouse name: Not on file  . Number of children: Not on file  . Years of education: Not on file  . Highest education level: Not on file  Occupational History  . Occupation: [employer]  Tobacco Use  . Smoking status: Former Smoker    Packs/day: 1.50    Years: 42.00    Pack years: 63.00    Quit date: 2005    Years since quitting: 17.1  . Smokeless tobacco: Never Used  Substance and Sexual Activity  . Alcohol use: Yes    Alcohol/week: 2.0 standard drinks    Types: 2 Cans of beer per week    Comment: mostly daily  . Drug use: Never    Comment: CBD  . Sexual activity: Not on file  Other Topics Concern  . Not on file  Social History Narrative  . Not on file   Social Determinants of Health   Financial Resource Strain: Not on file  Food Insecurity: No Food Insecurity  . Worried About Running  Out of Food in the Last Year: Never true  . Ran Out of Food in the Last Year: Never true  Transportation Needs: No Transportation Needs  . Lack of Transportation (Medical): No  . Lack of Transportation (Non-Medical): No  Physical Activity: Not on file  Stress: Not on file  Social Connections: Not on file  Intimate Partner Violence: Not on file    Allergies  Allergen Reactions  . Iodinated Diagnostic Agents Hives    History reviewed. No pertinent family history.  Prior to Admission medications   Medication Sig Start Date End Date Taking? Authorizing Provider  aspirin EC 81 MG tablet Take by mouth. 10/22/10   [provider]  Blood Glucose Monitoring Suppl (FIFTY50 GLUCOSE METER 2.0)  w/Device KIT Use as instructed 04/12/17   [provider]  carvedilol (COREG) 6.25 MG tablet Take by mouth 2 (two) times daily with a meal.  02/04/17   [provider]  cetirizine (ZYRTEC) 10 MG tablet Take 10 mg by mouth daily.  03/18/17   [provider]  fenofibrate micronized (ANTARA) 130 MG capsule Take 1 tablet daily 09/19/18   [provider]  Flax Oil-Fish Oil-Borage Oil CAPS Take 1,000 capsules by mouth daily.  05/02/13   [provider]  FLUoxetine (PROZAC) 20 MG capsule TAKE 1 CAPSULE BY MOUTH ONCE DAILY 03/28/17   [provider]  furosemide (LASIX) 20 MG tablet Take 20 mg by mouth 2 (two) times daily. Takes 67m in the morning and 261min the afternoon 05/30/17   [provider]  insulin glargine (LANTUS) 100 UNIT/ML injection Inject 24 Units into the skin daily.    [provider]  insulin regular (NOVOLIN R,HUMULIN R) 100 units/mL injection Inject 25 Units into the skin 3 (three) times daily before meals.    [provider]  Melatonin 3 MG CAPS Take 1 capsule by mouth at bedtime.     [provider]  Multiple Vitamin (MULTI-VITAMINS) TABS Take 1 tablet by mouth daily.  05/02/13   [provider]  traZODone (DESYREL) 50 MG tablet Take 50 mg by mouth at bedtime.    [provider]    Physical Exam: Vitals:   11/13/20 0902 11/13/20 1120 11/13/20 1526 11/13/20 1531  BP: (!) 123/57 (!) 147/72  123/60  Pulse: (!) 103 (!) 102  88  Resp: (!) 22   19  Temp:  98 F (36.7 C)  98.3 F (36.8 C)  TempSrc:  Oral    SpO2: 99% 98%  96%  Weight:   108.7 kg   Height:   5' 8"  (1.727 m)      . General:  Appears calm and comfortable and is in NAD; he is cantankerous . Eyes:  PERRL, EOMI, normal lids, iris . ENT:  grossly normal hearing, lips & tongue, mmm . Neck:  no LAD, masses or thyromegaly . Cardiovascular:  RR with mild tachycardia, no m/r/g. . Respiratory:   CTA bilaterally with no  wheezes/rales/rhonchi.  Normal to mildly increased respiratory effort. . Abdomen:  soft, NT, ND, NABS . Skin: RLE is markedly erythematous/edematous compared to the L with weeping and a few new-appearing small vesicles.  Erythema and edema extends up to his mid to upper thigh.          . Musculoskeletal:  RLE movement limited by pain, as above . Lower extremity:  Limited foot exam with no ulcerations or skin breaks . Psychiatric:  cantankerous mood and affect, speech fluent and appropriate, AOx3 .  Neurologic:  CN 2-12 grossly intact, moves all extremities in coordinated fashion other than as limited by pain    Radiological Exams on Admission: Independently reviewed - see discussion in A/P where applicable  DG Chest Portable 1 View  Result Date: 11/12/2020 CLINICAL DATA:  70 year old male with leg swelling. EXAM: PORTABLE CHEST 1 VIEW COMPARISON:  Chest CT dated 02/01/2020. FINDINGS: Bibasilar linear atelectasis/scarring. No focal consolidation, pleural effusion or pneumothorax. Mild cardiomegaly. Atherosclerotic calcification of the aorta. No acute osseous pathology. IMPRESSION: No active disease. Electronically Signed   By: Anner Crete M.D.   On: 11/12/2020 22:02    EKG: not done   Labs on Admission: I have personally reviewed the available labs and imaging studies at the time of the admission.  Pertinent labs:   Na++ 126 Glucose 139 AP 223 Albumin 3.2 AST 77/ALT 82 Lactate 1.1, 0.8 WBC 15.6 COVID negative   Assessment/Plan Principal Problem:   Cellulitis Active Problems:   Cognitive complaints   Essential hypertension   Hyperlipidemia   Class 2 obesity due to excess calories with body mass index (BMI) of 36.0 to 36.9 in adult    RLE Cellulitis -Patient without prior h/o cellulitis presenting with severe and refractory cellulitis to RLE -Currently with erythema to mid to upper thigh -He was seen by his PCP on 1/28 and there was no indication of  infection -He was seen on 1/31 and given Clinda but continued to worsen -Negative for DVT on 2/1 -+leukocytosis -He was given Rocephin at [facility] but will expand coverage to Cefepime/Vanc for now given apparent resistance and severity of disease -Continue Lasix -Admission in this patient is warranted due to:  -failure of outpatient antibiotic therapy (progression/no improvement after a minimum of 48 hours with adequate antibiotic regimen with first-generation cephalosporin or antistaph penicillin or PCN-allergic regimen or resistant organism regimen)  HTN -Continue Coreg -Hold Micardis for now due to normal BP and borderline renal function -Continue Hytrin (more likely treating BPH than BP)  CAD -Continue ASA  HLD -Continue Zetia -Hold fish oil, fenofibrate due to limited inpatient utility  DM -Will check A1c -Continue Lantus -Cover with moderate-scale SSI  Dementia/Mood disorder -Continue Buspar, Prozac, Restoril, Melatonin, trazodone -Continue Aricept  Obesity -Body mass index is 36.44 kg/m..  -Weight loss should be encouraged -Outpatient PCP/bariatric medicine f/u encouraged     Note: This patient has been tested and is negative for the novel coronavirus COVID-19. The patient has NOT beed vaccinated against COVID-19.   Level of care: Med-Surg DVT prophylaxis: Lovenox  Code Status:  DNR - confirmed with patient Family Communication: None present Disposition Plan:  The patient is from: home  Anticipated d/c is to: home without [facility] services  Anticipated d/c date will depend on clinical response to treatment, but likely 2-3 days  Patient is currently: acutely ill Consults called: None  Admission status:  Admit - It is my clinical opinion that admission to [place] is reasonable and necessary because of the expectation that this patient will require hospital care that crosses at least 2 midnights to treat this condition based on the medical complexity of the problems  presented.  Given the aforementioned information, the predictability of an adverse outcome is felt to be significant.    Karmen Bongo MD [facility]   How to contact the [facility] Attending or Consulting provider [place] or covering provider during after hours [place], for this patient?  1. Check the care team in [facility] and look for a) attending/consulting [facility]  provider listed and b) the [place] team listed 2. Log into [email or web address] and use [place]'s universal password to access. If you do not have the password, please contact the hospital operator. 3. Locate the [facility] provider you are looking for under [facility] and page to a number that you can be directly reached. 4. If you still have difficulty reaching the provider, please page the [facility] (Director on Call) for the Hospitalists listed on amion for assistance.   11/13/2020, 4:12 PM

## 2020-11-13 NOTE — Progress Notes (Signed)
Called to bedside re: pt c/o shortness of breath.  Pt noted to have mild wheezing and diaphragmatic breathing with gross increased work of breathing.  Bronchodilator administered with no change in pt's effort or bilateral breath sounds.  Pt requested his CPAP from home be set up, however the pt's water chamber was not present, rendering his unit unusable at this time.  Pt placed on hospital unit with settings input to match his home unit's.  O2 bleed in @2L  added, as pt was on 2L nasal cannula.  Some improvement in gross work of breathing once pt was on CPAP.  Relayed this information to his RN and suggested if the problem persists a diagnostic chest Xray may be necessary, as the pt did not respond to bronchodilators.

## 2020-11-13 NOTE — Progress Notes (Signed)
   11/13/20 1120  Assess: MEWS Score  Temp 98 F (36.7 C)  BP (!) 147/72  Pulse Rate (!) 102  Level of Consciousness Alert  SpO2 98 %  O2 Device Nasal Cannula  O2 Flow Rate (L/min) 2 L/min  Assess: MEWS Score  MEWS Temp 0  MEWS Systolic 0  MEWS Pulse 1  MEWS RR 1  MEWS LOC 0  MEWS Score 2  MEWS Score Color Yellow  Assess: if the MEWS score is Yellow or Red  Were vital signs taken at a resting state? Yes  Focused Assessment No change from prior assessment  Early Detection of Sepsis Score *See Row Information* High  MEWS guidelines implemented *See Row Information* No, previously yellow, continue vital signs every 4 hours  New patient admit from [place], stable c/o pain right leg. Pain med not due at this time. Noted previous yellow MEWs due tachycardia.Contacted [facility] Admission. Stated that Dr Lorin Mercy has will be the provider.

## 2020-11-13 NOTE — Progress Notes (Signed)
Initial Nutrition Assessment  DOCUMENTATION CODES:   Obesity unspecified  INTERVENTION:   -MVI with minerals daily -Ensure Max po daily, each supplement provides 150 kcal and 30 grams of protein -1 packet Juven BID, each packet provides 95 calories, 2.5 grams of protein (collagen), and 9.8 grams of carbohydrate (3 grams sugar); also contains 7 grams of L-arginine and L-glutamine, 300 mg vitamin C, 15 mg vitamin E, 1.2 mcg vitamin B-12, 9.5 mg zinc, 200 mg calcium, and 1.5 g  Calcium Beta-hydroxy-Beta-methylbutyrate to support wound healing  NUTRITION DIAGNOSIS:   Increased nutrient needs related to acute illness as evidenced by estimated needs.  GOAL:   Patient will meet greater than or equal to 90% of their needs  MONITOR:   PO intake,Supplement acceptance,Labs,Weight trends,Skin,I & O's  REASON FOR ASSESSMENT:   Consult Assessment of nutrition requirement/status  ASSESSMENT:   Jeremy Hickman is a 70 y.o. male with medical history significant of HTN and DM presenting with RLE cellulitis.  He reports RLE edema.  He thought it was fluid but now he thinks it might need amputation.  He went to urgent care last Sunday and was scheduled for DVT US.  He was told he needed to go to the emergency room.  He went to [facility] ER and he left.  Then he went to [facility] and was there all last night without assistance (he is very upset about his experience there).  He does feel like it has gotten better with the antibiotics since then.  Denies trauma to the area.  He did have a cut between his first and second toes fairly recently but that healed quickly. No h/o cellulitis.  Denies fever.  Used a walker at first due to discomfort but currently unable to bear weight at all.  The leg has been weeping.  Pt admitted with RLE cellulitis.   Reviewed I/O's: +104 ml x 24 hours  Pt unavailable at time of visit.   No meal completion data available to assess at this time.   No weight hx available to assess  at this time.   Pt with increased nutritional needs due to acute illness and would benefit from addition of oral nutrition supplements.   Medications reviewed and include colace, lasix, and melatonin.  Lab Results  Component Value Date   HGBA1C 6.7 (H) 11/13/2020   PTA DM medications are 10-25 units inuslin aspart TID and 42 units insulin glargine daily.   Labs reviewed: CBGS: 123 (inpatient orders for glycemic control are 0-15 units insulin aspart TID with meals, 0-5 units insulin aspart daily at bedtime, and 24 units insulin glargine daily).   Diet Order:   Diet Order            Diet Carb Modified Fluid consistency: Thin; Room service appropriate? Yes  Diet effective now                 EDUCATION NEEDS:   No education needs have been identified at this time  Skin:  Skin Assessment: Reviewed RN Assessment  Last BM:  Unknown  Height:   Ht Readings from Last 1 Encounters:  11/13/20 5\' 8"  (1.727 m)    Weight:   Wt Readings from Last 1 Encounters:  11/13/20 108.7 kg    Ideal Body Weight:  70 kg  BMI:  Body mass index is 36.44 kg/m.  Estimated Nutritional Needs:   Kcal:  1900-2100  Protein:  110-125 grams  Fluid:  > 1.9 L    Loistine Chance, RD,  LDN, [place] Registered Dietitian II Certified Diabetes Care and Education Specialist Please refer to [facility] for RD and/or RD on-call/weekend/after hours pager

## 2020-11-14 ENCOUNTER — Encounter (HOSPITAL_COMMUNITY): Payer: Self-pay | Admitting: Internal Medicine

## 2020-11-14 ENCOUNTER — Inpatient Hospital Stay (HOSPITAL_COMMUNITY): Payer: HMO

## 2020-11-14 DIAGNOSIS — I214 Non-ST elevation (NSTEMI) myocardial infarction: Secondary | ICD-10-CM

## 2020-11-14 DIAGNOSIS — A419 Sepsis, unspecified organism: Secondary | ICD-10-CM

## 2020-11-14 DIAGNOSIS — E1065 Type 1 diabetes mellitus with hyperglycemia: Secondary | ICD-10-CM

## 2020-11-14 DIAGNOSIS — I10 Essential (primary) hypertension: Secondary | ICD-10-CM | POA: Diagnosis not present

## 2020-11-14 DIAGNOSIS — L0390 Cellulitis, unspecified: Secondary | ICD-10-CM

## 2020-11-14 DIAGNOSIS — R7989 Other specified abnormal findings of blood chemistry: Secondary | ICD-10-CM

## 2020-11-14 DIAGNOSIS — F419 Anxiety disorder, unspecified: Secondary | ICD-10-CM

## 2020-11-14 DIAGNOSIS — L03115 Cellulitis of right lower limb: Principal | ICD-10-CM

## 2020-11-14 DIAGNOSIS — R0600 Dyspnea, unspecified: Secondary | ICD-10-CM

## 2020-11-14 DIAGNOSIS — R0603 Acute respiratory distress: Secondary | ICD-10-CM

## 2020-11-14 DIAGNOSIS — I509 Heart failure, unspecified: Secondary | ICD-10-CM

## 2020-11-14 LAB — GLUCOSE, CAPILLARY
Glucose-Capillary: 322 mg/dL — ABNORMAL HIGH (ref 70–99)
Glucose-Capillary: 327 mg/dL — ABNORMAL HIGH (ref 70–99)
Glucose-Capillary: 332 mg/dL — ABNORMAL HIGH (ref 70–99)
Glucose-Capillary: 348 mg/dL — ABNORMAL HIGH (ref 70–99)

## 2020-11-14 LAB — BASIC METABOLIC PANEL
Anion gap: 16 — ABNORMAL HIGH (ref 5–15)
BUN: 24 mg/dL — ABNORMAL HIGH (ref 8–23)
CO2: 20 mmol/L — ABNORMAL LOW (ref 22–32)
Calcium: 8.8 mg/dL — ABNORMAL LOW (ref 8.9–10.3)
Chloride: 92 mmol/L — ABNORMAL LOW (ref 98–111)
Creatinine, Ser: 1.27 mg/dL — ABNORMAL HIGH (ref 0.61–1.24)
GFR, Estimated: >60 mL/min (ref >60)
Glucose, Bld: 304 mg/dL — ABNORMAL HIGH (ref 70–99)
Potassium: 5.1 mmol/L (ref 3.5–5.1)
Sodium: 128 mmol/L — ABNORMAL LOW (ref 135–145)

## 2020-11-14 LAB — TROPONIN I (HIGH SENSITIVITY)
Troponin I (High Sensitivity): 6603 ng/L (ref <18)
Troponin I (High Sensitivity): 7382 ng/L (ref <18)

## 2020-11-14 LAB — CBC
HCT: 34.4 % — ABNORMAL LOW (ref 39.0–52.0)
Hemoglobin: 11.7 g/dL — ABNORMAL LOW (ref 13.0–17.0)
MCH: 32.6 pg (ref 26.0–34.0)
MCHC: 34 g/dL (ref 30.0–36.0)
MCV: 95.8 fL (ref 80.0–100.0)
Platelets: 271 10*3/uL (ref 150–400)
RBC: 3.59 MIL/uL — ABNORMAL LOW (ref 4.22–5.81)
RDW: 13.6 % (ref 11.5–15.5)
WBC: 20.5 10*3/uL — ABNORMAL HIGH (ref 4.0–10.5)
nRBC: 0 % (ref 0.0–0.2)

## 2020-11-14 LAB — ECHOCARDIOGRAM COMPLETE
Area-P 1/2: 6.32 cm2
Height: 68 in
S' Lateral: 3.4 cm
Weight: 3834.24 oz

## 2020-11-14 LAB — BRAIN NATRIURETIC PEPTIDE: B Natriuretic Peptide: 859.3 pg/mL — ABNORMAL HIGH (ref 0.0–100.0)

## 2020-11-14 MED ORDER — FUROSEMIDE 10 MG/ML IJ SOLN
40.0000 mg | Freq: Once | INTRAMUSCULAR | Status: AC
  Administered 2020-11-14: 40 mg via INTRAVENOUS
  Filled 2020-11-14: qty 4

## 2020-11-14 MED ORDER — INSULIN GLARGINE 100 UNIT/ML ~~LOC~~ SOLN
20.0000 [IU] | Freq: Two times a day (BID) | SUBCUTANEOUS | Status: DC
  Administered 2020-11-14: 20 [IU] via SUBCUTANEOUS
  Filled 2020-11-14 (×3): qty 0.2

## 2020-11-14 MED ORDER — NITROGLYCERIN 0.4 MG SL SUBL: 0.4000 mg | SUBLINGUAL_TABLET | SUBLINGUAL | Status: DC | PRN

## 2020-11-14 MED ORDER — IPRATROPIUM-ALBUTEROL 0.5-2.5 (3) MG/3ML IN SOLN
3.0000 mL | RESPIRATORY_TRACT | Status: DC | PRN
  Administered 2020-11-18: 3 mL via RESPIRATORY_TRACT

## 2020-11-14 MED ORDER — FOLIC ACID 1 MG PO TABS
1.0000 mg | ORAL_TABLET | Freq: Every day | ORAL | Status: DC
  Administered 2020-11-14 – 2020-11-27 (×13): 1 mg via ORAL
  Filled 2020-11-14 (×13): qty 1

## 2020-11-14 MED ORDER — INSULIN ASPART 100 UNIT/ML ~~LOC~~ SOLN
0.0000 [IU] | Freq: Three times a day (TID) | SUBCUTANEOUS | Status: DC
  Administered 2020-11-14 (×2): 11 [IU] via SUBCUTANEOUS

## 2020-11-14 MED ORDER — PANTOPRAZOLE SODIUM 40 MG PO TBEC
40.0000 mg | DELAYED_RELEASE_TABLET | Freq: Every day | ORAL | Status: DC
  Administered 2020-11-14 – 2020-11-20 (×7): 40 mg via ORAL
  Filled 2020-11-14 (×7): qty 1

## 2020-11-14 MED ORDER — HEPARIN (PORCINE) 25000 UT/250ML-% IV SOLN
1900.0000 [IU]/h | INTRAVENOUS | Status: DC
  Administered 2020-11-14: 1150 [IU]/h via INTRAVENOUS
  Administered 2020-11-15: 1350 [IU]/h via INTRAVENOUS
  Administered 2020-11-16 – 2020-11-17 (×2): 1900 [IU]/h via INTRAVENOUS
  Filled 2020-11-14 (×8): qty 250

## 2020-11-14 MED ORDER — CARVEDILOL 6.25 MG PO TABS: 6.2500 mg | ORAL_TABLET | Freq: Two times a day (BID) | ORAL | Status: DC

## 2020-11-14 MED ORDER — THIAMINE HCL 100 MG/ML IJ SOLN
100.0000 mg | Freq: Every day | INTRAMUSCULAR | Status: DC
  Administered 2020-11-18: 100 mg via INTRAVENOUS
  Filled 2020-11-14 (×4): qty 2

## 2020-11-14 MED ORDER — INSULIN ASPART 100 UNIT/ML ~~LOC~~ SOLN
8.0000 [IU] | Freq: Three times a day (TID) | SUBCUTANEOUS | Status: DC
  Administered 2020-11-14: 8 [IU] via SUBCUTANEOUS

## 2020-11-14 MED ORDER — CARVEDILOL 6.25 MG PO TABS
6.2500 mg | ORAL_TABLET | Freq: Two times a day (BID) | ORAL | Status: DC
  Administered 2020-11-15 – 2020-11-16 (×3): 6.25 mg via ORAL
  Filled 2020-11-14 (×3): qty 1

## 2020-11-14 MED ORDER — ADULT MULTIVITAMIN W/MINERALS CH: 1.0000 | ORAL_TABLET | Freq: Every day | ORAL | Status: DC

## 2020-11-14 MED ORDER — ALUM & MAG HYDROXIDE-SIMETH 200-200-20 MG/5ML PO SUSP
30.0000 mL | Freq: Four times a day (QID) | ORAL | Status: DC | PRN
  Administered 2020-11-15 – 2020-11-16 (×2): 30 mL via ORAL
  Filled 2020-11-14 (×2): qty 30

## 2020-11-14 MED ORDER — INSULIN ASPART 100 UNIT/ML ~~LOC~~ SOLN
0.0000 [IU] | Freq: Three times a day (TID) | SUBCUTANEOUS | Status: DC
  Administered 2020-11-15: 15 [IU] via SUBCUTANEOUS
  Administered 2020-11-15 (×2): 11 [IU] via SUBCUTANEOUS
  Administered 2020-11-16: 15 [IU] via SUBCUTANEOUS
  Administered 2020-11-16: 7 [IU] via SUBCUTANEOUS
  Administered 2020-11-16: 11 [IU] via SUBCUTANEOUS
  Administered 2020-11-17: 3 [IU] via SUBCUTANEOUS
  Administered 2020-11-17 (×2): 7 [IU] via SUBCUTANEOUS

## 2020-11-14 MED ORDER — LORAZEPAM 2 MG/ML IJ SOLN: 1.0000 mg | INTRAMUSCULAR | Status: DC | PRN

## 2020-11-14 MED ORDER — LORAZEPAM 2 MG/ML IJ SOLN
0.5000 mg | Freq: Once | INTRAMUSCULAR | Status: AC
  Administered 2020-11-14: 0.5 mg via INTRAVENOUS
  Filled 2020-11-14: qty 1

## 2020-11-14 MED ORDER — INSULIN ASPART 100 UNIT/ML ~~LOC~~ SOLN
6.0000 [IU] | Freq: Three times a day (TID) | SUBCUTANEOUS | Status: DC
  Administered 2020-11-14: 6 [IU] via SUBCUTANEOUS

## 2020-11-14 MED ORDER — DIPHENHYDRAMINE HCL 25 MG PO CAPS
25.0000 mg | ORAL_CAPSULE | Freq: Three times a day (TID) | ORAL | Status: DC | PRN
  Administered 2020-11-16 – 2020-11-27 (×3): 25 mg via ORAL
  Filled 2020-11-14 (×3): qty 1

## 2020-11-14 MED ORDER — INSULIN ASPART 100 UNIT/ML ~~LOC~~ SOLN
0.0000 [IU] | Freq: Every day | SUBCUTANEOUS | Status: DC
  Administered 2020-11-14: 4 [IU] via SUBCUTANEOUS
  Administered 2020-11-15 – 2020-11-16 (×2): 2 [IU] via SUBCUTANEOUS

## 2020-11-14 MED ORDER — ATORVASTATIN CALCIUM 80 MG PO TABS
80.0000 mg | ORAL_TABLET | Freq: Every day | ORAL | Status: DC
  Administered 2020-11-15 – 2020-11-18 (×4): 80 mg via ORAL
  Filled 2020-11-14 (×4): qty 1

## 2020-11-14 MED ORDER — LORAZEPAM 1 MG PO TABS: 1.0000 mg | ORAL_TABLET | ORAL | Status: DC | PRN

## 2020-11-14 MED ORDER — GUAIFENESIN ER 600 MG PO TB12
600.0000 mg | ORAL_TABLET | Freq: Two times a day (BID) | ORAL | Status: DC
Start: 2020-11-14 — End: 2020-11-21
  Administered 2020-11-15 – 2020-11-20 (×12): 600 mg via ORAL
  Filled 2020-11-14 (×13): qty 1

## 2020-11-14 MED ORDER — THIAMINE HCL 100 MG PO TABS
100.0000 mg | ORAL_TABLET | Freq: Every day | ORAL | Status: DC
  Administered 2020-11-14 – 2020-11-27 (×12): 100 mg via ORAL
  Filled 2020-11-14 (×12): qty 1

## 2020-11-14 MED ORDER — INSULIN ASPART 100 UNIT/ML ~~LOC~~ SOLN: 0.0000 [IU] | Freq: Every day | SUBCUTANEOUS | Status: DC

## 2020-11-14 NOTE — Progress Notes (Signed)
Notified by patient's RN about patient's critical troponin level of 6600.  Patient had respiratory distress and elevated BNP earlier in the day.  Respiratory distress improved after brief BiPAP.  TTE done and showed mildly reduced EF and RWMA.  No prior TTE to compare to. It was unclear if RWMA was new or old.  So twelve-lead EKG and troponin were ordered.  Patient remained chest pain-free throughout the day.  He was hemodynamically stable with good saturation on 2 L by nasal cannula after brief BiPAP in the morning.  Consulted pharmacy for heparin.  [facility], Dr. Marlou Porch who will attend to patient.

## 2020-11-14 NOTE — Progress Notes (Signed)
PROGRESS NOTE  Jeremy Hickman NID:[id number] DOB: 04-Jan-1951   PCP: Malachi Pro [name], PA-C  Patient is from: Home. Independently ambulates at baseline.  DOA: 11/12/2020 LOS: 1  Chief complaints: Right leg swelling, redness and weeping  Brief Narrative / Interim history: 70 year old male with PMH of OSA on CPAP, HTN, DM-2, morbid obesity, anxiety and dementia presented to [facility] ED with RLE swelling, redness, weeping, pain and difficulty bearing weight, and admitted for cellulitis after he failed outpatient treatment with p.o. clindamycin. His LE [place] was negative for DVT on 11/11/2020 at [facility].  Initially started on IV ceftriaxone but antibiotics cannulated to cefepime and vancomycin.  The next day, patient developed respiratory distress with increased work of breathing and wheezing. CXR without acute finding. BNP elevated to 800. Started on IV Lasix, BiPAP and as needed DuoNebs and transferred to progressive care. He also received anxiolytics.   Subjective: Seen and examined earlier this morning. He developed some increased work of breathing and wheezing and rapid response called. CXR without significant finding. Started on trial of IV Lasix, DuoNeb and Ativan without significant improvement in his increased work of breathing. He still maintaining good saturation on room air. He is also tachypneic, tachycardic with elevated BP. No chest pain. Seems to be very anxious. Started on BiPAP with improvement in his work of breathing. Transfer to progressive care.   Objective: Vitals:   11/14/20 0900 11/14/20 0926 11/14/20 0946 11/14/20 1045  BP: (!) 155/68  (!) 145/98 (!) 163/89  Pulse: (!) 102 96 95 92  Resp: (!) 30 (!) 23 (!) 22 (!) 21  Temp: 98 F (36.7 C)  98.1 F (36.7 C) 97.9 F (36.6 C)  TempSrc: Axillary  Axillary Axillary  SpO2: 96% 96% 100% 99%  Weight:      Height:        Intake/Output Summary (Last 24 hours) at 11/14/2020 1118 Last data filed at 11/14/2020 0706 Gross per 24 hour   Intake 759.03 ml  Output 3 ml  Net 756.03 ml   Filed Weights   11/12/20 2316 11/13/20 1526  Weight: 109.8 kg 108.7 kg    Examination:  GENERAL: Some increased work of breathing and wheezing.  HEENT: MMM.  Vision and hearing grossly intact.  NECK: Supple.  No apparent JVD but difficult exam due to body habitus.Marland Kitchen  RESP: 99% on RA. Increased work of breathing. Wheezing but from upper airway. No crackles CVS: Tachycardic. Heart sounds normal.  ABD/GI/GU: BS+. Abd soft, NTND.  MSK/EXT:  Moves extremities. 2+ edema with erythema all the way to lower thigh in RLE. Increased warmth to touch. SKIN: Skin erythema as above. NEURO: Awake, alert and oriented appropriately.  No apparent focal neuro deficit. Very anxious. PSYCH: Very anxious.  Procedures:  None  Microbiology summarized: COVID-19 PCR nonreactive. Blood cultures NGTD.  Assessment & Plan: Sepsis due to RLE Cellulitis: POA. Was tachycardic with leukocytosis on admission. -Progressively worsened despite p.o. clindamycin outpatient. -Negative for DVT on 2/1 per care everywhere. -Continue vancomycin and cefepime-we will DC vancomycin if MRSA PCR negative.  Respiratory distress: developed increased work of breathing and wheeze this morning. Wheezing seems to be from upper airway. No history of COPD or asthma but OSA. CXR with mild perihilar and basilar interstitial densities. BNP 860 (no prior to compare to). He appears to be very anxious as well. Work of breathing did not improve with IV Lasix, DuoNeb and Ativan until he was started on BiPAP. He could be withdrawing from alcohol. -Discontinue Coreg-could have bronchospasm  from this. -BiPAP for work of breathing.  Moved to progressive care unit -Continue as needed DuoNeb -Continue IV Lasix 40 mg daily -Continue as needed Ativan -Add mucolytic's -Check TTE  Essential hypertension: BP elevated in the setting of increased work of breathing and anxiety. -Discontinue Coreg in the  setting of respiratory distress -Hold Micardis given rising creatinine. -IV Lasix as above -Continue Terazosin -As needed metoprolol  History of CAD: No anginal symptoms. -Continue home medications   Controlled IDDM-1 with hyperglycemia and hyperlipidemia: A1c 6.7%. Recent Labs  Lab 11/13/20 1201 11/13/20 1706 11/13/20 2029 11/14/20 0736  GLUCAP 123* 153* 263* 322*  -Continue Lantus and SSI-moderate -Add NovoLog 6 units AC -Continue home statin.  Dementia without behavioral disturbance -Continue Buspar, Prozac, Restoril, Melatonin, trazodone -Continue Aricept  Anxiety/insomnia: his anxiety seems to be driving his respiratory distress. -Continue home BuSpar, Prozac, trazodone, Restoril and melatonin -Added as needed Ativan.  Alcohol use: Reports drinking 2 beers a day. Not sure if he is honest with [place]. His anxiety could be withdrawal. -Start CIWA protocol with as needed Ativan  OSA on CPAP -Currently on BiPAP.  Morbid obesity -Weight loss should be encouraged -Outpatient PCP/bariatric medicine f/u encouraged Body mass index is 36.44 kg/m. Nutrition Problem: Increased nutrient needs Etiology: acute illness Signs/Symptoms: estimated needs Interventions: MVI,Juven,Premier Protein   DVT prophylaxis:  Subcu Lovenox  Code Status: DNR/DNI Family Communication: Updated patient's wife over the phone. Level of care: Progressive Status is: Inpatient  Remains inpatient appropriate because:Hemodynamically unstable, Ongoing diagnostic testing needed not appropriate for outpatient work up, Unsafe d/c plan, IV treatments appropriate due to intensity of illness or inability to take PO and Inpatient level of care appropriate due to severity of illness   Dispo: The patient is from: Home              Anticipated d/c is to: Home              Anticipated d/c date is: 2 days              Patient currently is not medically stable to d/c.   Difficult to place patient  No       Consultants:  None   Sch Meds:  Scheduled Meds: . aspirin EC  81 mg Oral Daily  . busPIRone  15 mg Oral QPM  . busPIRone  30 mg Oral Q0600  . docusate sodium  100 mg Oral BID  . donepezil  5 mg Oral QHS  . enoxaparin (LOVENOX) injection  50 mg Subcutaneous Q24H  . ezetimibe  10 mg Oral Daily  . FLUoxetine  20 mg Oral Daily  . folic acid  1 mg Oral Daily  . guaiFENesin  600 mg Oral BID  . insulin aspart  0-15 Units Subcutaneous TID WC  . insulin aspart  0-5 Units Subcutaneous QHS  . insulin aspart  6 Units Subcutaneous TID WC  . insulin glargine  24 Units Subcutaneous Daily  . loratadine  10 mg Oral Daily  . melatonin  3 mg Oral QHS  . multivitamin with minerals  1 tablet Oral Daily  . nutrition supplement (JUVEN)  1 packet Oral BID BM  . Ensure Max Protein  11 oz Oral QHS  . sodium chloride flush  3 mL Intravenous Q12H  . terazosin  5 mg Oral QHS  . thiamine  100 mg Oral Daily   Or  . thiamine  100 mg Intravenous Daily   Continuous Infusions: . sodium chloride 10 mL/hr at  11/14/20 0151  . ceFEPime (MAXIPIME) IV 2 g (11/14/20 0811)  . vancomycin     PRN Meds:.acetaminophen **OR** acetaminophen, bisacodyl, diphenhydrAMINE, hydrALAZINE, HYDROcodone-acetaminophen, ipratropium-albuterol, LORazepam **OR** LORazepam, morphine injection, ondansetron **OR** ondansetron (ZOFRAN) IV, polyethylene glycol, temazepam  Antimicrobials: Anti-infectives (From admission, onward)   Start     Dose/Rate Route Frequency Ordered Stop   11/14/20 1630  vancomycin (VANCOREADY) IVPB 1500 mg/300 mL        1,500 mg 150 mL/hr over 120 Minutes Intravenous Every 24 hours 11/13/20 1542     11/13/20 2300  cefTRIAXone (ROCEPHIN) 1 g in sodium chloride 0.9 % 100 mL IVPB  Status:  Discontinued        1 g 200 mL/hr over 30 Minutes Intravenous Every 24 hours 11/13/20 1158 11/13/20 1510   11/13/20 1600  vancomycin (VANCOCIN) 2,250 mg in sodium chloride 0.9 % 500 mL IVPB        2,250 mg 250  mL/hr over 120 Minutes Intravenous  Once 11/13/20 1510 11/13/20 1855   11/13/20 1600  ceFEPIme (MAXIPIME) 2 g in sodium chloride 0.9 % 100 mL IVPB  Status:  Discontinued        2 g 200 mL/hr over 30 Minutes Intravenous  Once 11/13/20 1510 11/13/20 1542   11/13/20 1600  ceFEPIme (MAXIPIME) 2 g in sodium chloride 0.9 % 100 mL IVPB        2 g 200 mL/hr over 30 Minutes Intravenous Every 8 hours 11/13/20 1542     11/13/20 0000  cefTRIAXone (ROCEPHIN) 2 g in sodium chloride 0.9 % 100 mL IVPB        2 g 200 mL/hr over 30 Minutes Intravenous  Once 11/12/20 2352 11/13/20 0042       I have personally reviewed the following labs and images: CBC: Recent Labs  Lab 11/12/20 2144 11/14/20 0448  WBC 15.6* 20.5*  NEUTROABS 13.2*  --   HGB 13.2 11.7*  HCT 38.1* 34.4*  MCV 93.6 95.8  PLT 217 271   BMP &GFR Recent Labs  Lab 11/12/20 2144 11/14/20 0448  NA 126* 128*  K 4.2 5.1  CL 93* 92*  CO2 23 20*  GLUCOSE 139* 304*  BUN 30* 24*  CREATININE 1.11 1.27*  CALCIUM 8.7* 8.8*   Estimated Creatinine Clearance: 63.8 mL/min (A) (by C-G formula based on SCr of 1.27 mg/dL (H)). Liver & Pancreas: Recent Labs  Lab 11/12/20 2144  AST 77*  ALT 82*  ALKPHOS 223*  BILITOT 0.3  PROT 6.7  ALBUMIN 3.2*   No results for input(s): LIPASE, AMYLASE in the last 168 hours. No results for input(s): AMMONIA in the last 168 hours. Diabetic: Recent Labs    11/13/20 1256  HGBA1C 6.7*   Recent Labs  Lab 11/13/20 1201 11/13/20 1706 11/13/20 2029 11/14/20 0736  GLUCAP 123* 153* 263* 322*   Cardiac Enzymes: No results for input(s): CKTOTAL, CKMB, CKMBINDEX, TROPONINI in the last 168 hours. No results for input(s): PROBNP in the last 8760 hours. Coagulation Profile: No results for input(s): INR, PROTIME in the last 168 hours. Thyroid Function Tests: Recent Labs    11/13/20 1256  TSH 1.931   Lipid Profile: No results for input(s): CHOL, HDL, LDLCALC, TRIG, CHOLHDL, LDLDIRECT in the last 72  hours. Anemia Panel: No results for input(s): VITAMINB12, FOLATE, FERRITIN, TIBC, IRON, RETICCTPCT in the last 72 hours. Urine analysis:    Component Value Date/Time   COLORURINE YELLOW 11/12/2020 1832   APPEARANCEUR CLEAR 11/12/2020 1832   LABSPEC 1.015 11/12/2020  [place] 5.0 11/12/2020 1832   GLUCOSEU NEGATIVE 11/12/2020 1832   HGBUR NEGATIVE 11/12/2020 1832   BILIRUBINUR NEGATIVE 11/12/2020 1832   KETONESUR NEGATIVE 11/12/2020 1832   PROTEINUR NEGATIVE 11/12/2020 1832   NITRITE NEGATIVE 11/12/2020 1832   LEUKOCYTESUR NEGATIVE 11/12/2020 1832   Sepsis Labs: Invalid input(s): PROCALCITONIN, [place]  Microbiology: Recent Results (from the past 240 hour(s))  Culture, blood (routine x 2)     Status: None (Preliminary result)   Collection Time: 11/12/20  9:36 PM   Specimen: BLOOD  Result Value Ref Range Status   Specimen Description   Final    BLOOD Blood Culture adequate volume Performed at [facility], [address] [place], [place] [address]    Special Requests   Final    BOTTLES DRAWN AEROBIC AND ANAEROBIC RIGHT ANTECUBITAL Performed at [facility], [address], [place], [place] [address]    Culture   Final    NO GROWTH 1 DAY Performed at [facility], [place] [address] [place], [place] [address]    Report Status PENDING  Incomplete  SARS CORONAVIRUS 2 (TAT 6-24 HRS) Nasopharyngeal     Status: None   Collection Time: 11/12/20  9:44 PM   Specimen: Nasopharyngeal  Result Value Ref Range Status   SARS Coronavirus 2 NEGATIVE NEGATIVE Final    Comment: (NOTE) SARS-CoV-2 target nucleic acids are NOT DETECTED.  The SARS-CoV-2 RNA is generally detectable in upper and lower respiratory specimens during the acute phase of infection. Negative results do not preclude SARS-CoV-2 infection, do not rule out co-infections with other pathogens, and should not be used as the sole basis for treatment or other patient management  decisions. Negative results must be combined with clinical observations, patient history, and epidemiological information. The expected result is Negative.  Fact Sheet for Patients: SugarRoll.be  Fact Sheet for Healthcare Providers: https://www.woods-mathews.com/  This test is not yet approved or cleared by the [place] FDA and  has been authorized for detection and/or diagnosis of SARS-CoV-2 by FDA under an Emergency Use Authorization (EUA). This EUA will remain  in effect (meaning this test can be used) for the duration of the COVID-19 declaration under Se ction 564(b)(1) of the Act, 21 U.S.C. section 360bbb-3(b)(1), unless the authorization is terminated or revoked sooner.  Performed at [facility], [place] [address] [place], [place] [address]   Culture, blood (routine x 2)     Status: None (Preliminary result)   Collection Time: 11/12/20 10:11 PM   Specimen: BLOOD  Result Value Ref Range Status   Specimen Description   Final    BLOOD BLOOD RIGHT HAND Performed at [facility], [address] [place], [place] [address]    Special Requests   Final    BOTTLES DRAWN AEROBIC AND ANAEROBIC Blood Culture adequate volume Performed at [facility], [address] [place], [place] [address]    Culture   Final    NO GROWTH 1 DAY Performed at [facility], [place] [address] [place], [place] [address]    Report Status PENDING  Incomplete    Radiology Studies: DG Chest Port 1 View  Result Date: 11/14/2020 CLINICAL DATA:  Shortness of breath, bilateral lower extremity swelling. EXAM: PORTABLE CHEST 1 VIEW COMPARISON:  November 12, 2020. FINDINGS: Stable cardiomegaly. No pneumothorax is noted. Mild perihilar and basilar interstitial densities are noted concerning for edema or possibly scarring. No pleural  effusion is noted. Bony thorax is unremarkable. IMPRESSION: Mild perihilar and basilar interstitial  densities are noted concerning for edema or possibly scarring. Aortic Atherosclerosis (ICD10-I70.0). Electronically Signed   By: Marijo Conception M.D.   On: 11/14/2020 08:58    [name]. Huron  If 7PM-7AM, please contact night-coverage [email or web address] 11/14/2020, 11:18 AM

## 2020-11-14 NOTE — Progress Notes (Signed)
TOC consulted for substance abuse resources. Pt currently is only oriented x 2.  TOC will follow.

## 2020-11-14 NOTE — Consult Note (Addendum)
Cardiology Consultation:   Patient ID: STRIDER VALLANCE MRN: [id number]; DOB: 1951-07-16  Admit date: 11/12/2020 Date of Consult: 11/14/2020  Primary Care Provider: Drosinis, [name], PA-C [facility] Cardiologist: [facility] (Dr. Atilano Median) [place] Electrophysiologist:  None    Patient Profile:   ZALE MARCOTTE is a 70 y.o. 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 Cyndia Skeeters.  History of Present Illness:   Mr. Tackitt is a a 70 year old male with the above history who follows with Dr. Atilano Median at [facility] for his cardiac care. He has a history of CAD with remote inferior MI in 1993 which was treating with stenting to RCA. He has done well since that time. 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 He was recently seen by Dr. Atilano Median on 11/05/2020 at which time he reported some chronic mild bilateral ankle swelling due to venous insufficiency which was stable but was otherwise doing well from a cardiac standpoint. He does have dementia. He is on Aricept and follows with Neurology for this. However, he is fully functional. He takes care of himself and his wife. He is still driving and taking care of his own finances.  Patient presented to the ED on 11/12/2020 for further evaluation of right lower extremity edema/redness. He was seen at an Urgent Care and then [facility] for the same complaint. Lower extremity ultrasound was negative for DVT. He was started on antibiotics (Clindamycine) but he did not feel like this helped. Therefore, he came to [facility] ED for further evaluation. Given he failed outpatient treatment, he was  admitted and started on IV antibiotics for cellulitis. This morning he developed respiratory distress and had wheezes and crackles noted on exam. He was given Lasix and Albuterol but symptoms continued and he was placed on BiPAP. BNP was elevated at 859. Echo was ordered and showed LVEF of 40-45% with akinesis of inferior wall and posterior wall as well as grade 1 diastolic function. High-sensitivity troponin was checked and came back markedly elevated at 6,603. Therefore, he was started on IV Heparin and Cardiology was consulted.  At the time of this evaluation, patient back on nasal cannula with some mild increased work of breathing but O2 sats in the high 90's. He reports he has done well from a cardiac standpoint since his heart attack in 1993. He states that he did not have stent place; however, Dr. Gilman Schmidt last office visit note in [place] mentions a stent to his RCA . He reports he had a "diabetic seizure" a couple of weeks ago when his blood sugars spiked. He states his wife witnessed this. He state he hit his right foot on a piece of furniture at that time and broke his toe and lacerated the skin between his toes. He did not go to the hospital or see his PCP for this event. He did later go to an Urgent Care due to concern of a broke toe. Patient reports that he was told nothing needed to be done for his toe and then was given some "iodine" to help the laceration between his toes. Laceration eventually healed and he thought everything was fine and then he started  developing right leg swelling/redness about 1 week ago which continued to get worse. Otherwise, no recent medical issues. He denies any chest pain. No shortness of breath prior today. No trouble breathing at night as long as he uses his CPAP machine. Specifically, no orthopnea or PND. No palpitations, lightheadedness, dizziness, or syncope. No fevers at home. He states he always has some postnasal drip but no cough. No GI symptoms. No  abnormal bleeding in urine or stools.    He is a former smoker but states he quit 18 years ago. He notes some alcohol use but no drug use. He denies any family history of heart disease or stroke.  Past Medical History:  Diagnosis Date  . Cellulitis and abscess of right leg 11/13/2020  . Class 2 obesity due to excess calories with body mass index (BMI) of 36.0 to 36.9 in adult 11/13/2020  . Dementia ([place])   . Diabetes mellitus without complication ([place])   . Hypertension     Past Surgical History:  Procedure Laterality Date  . CATARACT EXTRACTION    . COLONOSCOPY    . EYE SURGERY    . HEMORROIDECTOMY    . ROTATOR CUFF REPAIR       Home Medications:  Prior to Admission medications   Medication Sig Start Date End Date Taking? Authorizing Provider  aspirin EC 81 MG tablet Take by mouth. 10/22/10  Yes [provider]  busPIRone (BUSPAR) 15 MG tablet Take 15-30 mg by mouth See admin instructions. Taking 30 mg in the AM and 15 mg in the evening. 11/10/20  Yes [provider]  carvedilol (COREG) 6.25 MG tablet Take 6.25 mg by mouth 2 (two) times daily with a meal. 02/04/17  Yes [provider]  cetirizine (ZYRTEC) 10 MG tablet Take 10 mg by mouth daily.  03/18/17  Yes [provider]  diphenhydrAMINE (BENADRYL) 25 mg capsule Take 25 mg by mouth every 6 (six) hours as needed for allergies.   Yes [provider]  donepezil (ARICEPT) 5 MG tablet Take 5 mg by mouth at bedtime. 11/10/20  Yes [provider]  ezetimibe (ZETIA) 10 MG tablet Take 10 mg by mouth daily. 11/03/20  Yes [provider]  Flax Oil-Fish Oil-Borage Oil CAPS Take 1,000 capsules by mouth daily.  05/02/13  Yes [provider]  FLUoxetine (PROZAC) 40 MG capsule Take 40 mg by mouth daily. 03/28/17  Yes [provider]  furosemide (LASIX) 20 MG tablet Take 20-40 mg by mouth See admin instructions. Takes 66m in the morning and 253min the afternoon 05/30/17  Yes  [provider]  insulin glargine (LANTUS) 100 UNIT/ML injection Inject 42 Units into the skin at bedtime.   Yes [provider]  ketoconazole (NIZORAL) 2 % shampoo Apply 1 application topically 2 (two) times a week. 10/22/20  Yes [provider]  Melatonin 3 MG CAPS Take 3 mg by mouth at bedtime.   Yes [provider]  Multiple Vitamin (MULTI-VITAMINS) TABS Take 1 tablet by mouth daily.  05/02/13  Yes [provider]  NOVOLOG FLEXPEN RELION 100 UNIT/ML FlexPen Inject 10-25 Units into the skin 3 (three) times daily before meals. 10/13/20  Yes [provider]  telmisartan (MICARDIS) 80 MG tablet Take 80 mg by mouth daily. 11/03/20  Yes [provider]  temazepam (RESTORIL) 15 MG capsule Take 15 mg by mouth at bedtime as needed for sleep. 10/13/20  Yes [provider]  terazosin (HYTRIN) 5 MG capsule Take 5  mg by mouth at bedtime. 09/08/20  Yes [provider]  Blood Glucose Monitoring Suppl (FIFTY50 GLUCOSE METER 2.0) w/Device KIT Use as instructed 04/12/17   [provider]  fenofibrate micronized (ANTARA) 130 MG capsule Take 1 tablet daily Patient not taking: No sig reported 09/19/18   [provider]  traZODone (DESYREL) 50 MG tablet Take 50 mg by mouth at bedtime. Patient not taking: No sig reported    [provider]    Inpatient Medications: Scheduled Meds: . aspirin EC  81 mg Oral Daily  . busPIRone  15 mg Oral QPM  . busPIRone  30 mg Oral Q0600  . docusate sodium  100 mg Oral BID  . donepezil  5 mg Oral QHS  . ezetimibe  10 mg Oral Daily  . FLUoxetine  20 mg Oral Daily  . folic acid  1 mg Oral Daily  . guaiFENesin  600 mg Oral BID  . [START ON 11/15/2020] insulin aspart  0-20 Units Subcutaneous TID WC  . insulin aspart  0-5 Units Subcutaneous QHS  . insulin aspart  8 Units Subcutaneous TID WC  . insulin glargine  20 Units Subcutaneous BID  . loratadine  10 mg Oral Daily  . melatonin   3 mg Oral QHS  . multivitamin with minerals  1 tablet Oral Daily  . nutrition supplement (JUVEN)  1 packet Oral BID BM  . pantoprazole  40 mg Oral Daily  . Ensure Max Protein  11 oz Oral QHS  . sodium chloride flush  3 mL Intravenous Q12H  . terazosin  5 mg Oral QHS  . thiamine  100 mg Oral Daily   Or  . thiamine  100 mg Intravenous Daily   Continuous Infusions: . sodium chloride 10 mL/hr at 11/14/20 0151  . ceFEPime (MAXIPIME) IV 2 g (11/14/20 1525)  . heparin    . vancomycin Stopped (11/14/20 2045)   PRN Meds: acetaminophen **OR** acetaminophen, alum & mag hydroxide-simeth, bisacodyl, diphenhydrAMINE, hydrALAZINE, HYDROcodone-acetaminophen, ipratropium-albuterol, LORazepam **OR** LORazepam, morphine injection, ondansetron **OR** ondansetron (ZOFRAN) IV, polyethylene glycol, temazepam  Allergies:    Allergies  Allergen Reactions  . Iodinated Diagnostic Agents Hives    Social History:   Social History   Socioeconomic History  . Marital status: Married    Spouse name: Not on file  . Number of children: Not on file  . Years of education: Not on file  . Highest education level: Not on file  Occupational History  . Occupation: [employer]  Tobacco Use  . Smoking status: Former Smoker    Packs/day: 1.50    Years: 42.00    Pack years: 63.00    Quit date: 2005    Years since quitting: 17.1  . Smokeless tobacco: Never Used  Vaping Use  . Vaping Use: Never used  Substance and Sexual Activity  . Alcohol use: Yes    Alcohol/week: 2.0 standard drinks    Types: 2 Cans of beer per week    Comment: mostly daily  . Drug use: Never    Comment: CBD  . Sexual activity: Not on file  Other Topics Concern  . Not on file  Social History Narrative  . Not on file   Social Determinants of Health   Financial Resource Strain: Not on file  Food Insecurity: No Food Insecurity  . Worried About Charity fundraiser in the Last Year: Never true  . Ran Out of Food in the Last  Year: Never true  Transportation Needs:  No Transportation Needs  . Lack of Transportation (Medical): No  . Lack of Transportation (Non-Medical): No  Physical Activity: Not on file  Stress: Not on file  Social Connections: Not on file  Intimate Partner Violence: Not on file    Family History:    Family History  Problem Relation Age of Onset  . Cancer Mother        lunger cancer  . Cancer Brother   . Heart disease Neg Hx   . Stroke Neg Hx      ROS:  Please see the history of present illness.  Review of Systems  Constitutional: Negative for fever.  HENT: Positive for congestion (post nasal drainage).   Respiratory: Positive for shortness of breath.   Cardiovascular: Positive for leg swelling. Negative for chest pain, palpitations, orthopnea and PND.  Gastrointestinal: Negative for abdominal pain, blood in stool, nausea and vomiting.  Genitourinary: Negative for hematuria.  Musculoskeletal: Positive for falls (during diabetic seizure).  Skin: Positive for rash.  Neurological: Positive for seizures ("diabetic seizure" ). Negative for dizziness.  Endo/Heme/Allergies: Does not bruise/bleed easily.  Psychiatric/Behavioral: Positive for memory loss (dementia) and substance abuse (alcohol use, prior tobacco use).  All other systems reviewed and are negative.   Physical Exam/Data:   Vitals:   11/14/20 1045 11/14/20 1135 11/14/20 1243 11/14/20 1559  BP: (!) 163/89  140/80 134/70  Pulse: 92 91 94 94  Resp: (!) _0 Temp: 97.9 F (36.6 C)   98.1 F (36.7 C)  TempSrc: Axillary   Oral  SpO2: 99% 100%  99%  Weight:      Height:        Intake/Output Summary (Last 24 hours) at 11/14/2020 2053 Last data filed at 11/14/2020 8416 Gross per 24 hour  Intake 259.03 ml  Output 1 ml  Net 258.03 ml   Last 3 Weights 11/13/2020 11/12/2020  Weight (lbs) 239 lb 10.2 oz 242 lb  Weight (kg) 108.7 kg 109.77 kg     Body mass index is 36.44 kg/m.  General: 70 y.o. male resting  comfortably in no acute distress. HEENT: Normocephalic and atraumatic. Sclera clear.  Neck: Supple. No carotid bruits. JVD elevated. Heart: RRR. Distinct S1 and S2. No murmurs, gallops, or rubs. Radial pulses 2+ and equal bilaterally. Lungs: Tachypnea with mild increased work of breathing. Expiratory wheezes throughout with mild crackles in bases. Abdomen: Soft, non-distended, and non-tender to palpation. Bowel sounds present. Extremities: 3+ pitting edema of right lower extremity up to knee with bright erythema. Warm to the touch.  Skin: Warm and dry. Neuro: No focal deficits. Psych: Normal affect. Responds appropriately.   EKG:  The EKG was personally reviewed and demonstrates:  No EKG in system or paper chart yet.  Telemetry:  Telemetry was personally reviewed and demonstrates:  Normal sinus rhythm with rates in the 80's to 90's.  Relevant CV Studies:  Echocardiogram 11/14/2020: Impressions: 1. Left ventricular ejection fraction, by estimation, is 40 to 45%. The  left ventricle has mildly decreased function. The left ventricle  demonstrates regional wall motion abnormalities (see scoring  diagram/findings for description). There is mild left  ventricular hypertrophy. Left ventricular diastolic parameters are  consistent with Grade I diastolic dysfunction (impaired relaxation).  Elevated left atrial pressure.  2. Right ventricular systolic function is normal. The right ventricular  size is normal.  3. The mitral valve is normal in structure. No evidence of mitral valve  regurgitation. No evidence of mitral stenosis.  4. The aortic valve  is normal in structure. Aortic valve regurgitation is  not visualized. Mild to moderate aortic valve sclerosis/calcification is  present, without any evidence of aortic stenosis.  5. The inferior vena cava is normal in size with greater than 50%  respiratory variability, suggesting right atrial pressure of 3 mmHg.  Laboratory Data:  High  Sensitivity Troponin:   Recent Labs  Lab 11/14/20 1656 11/14/20 1916  TROPONINIHS 6,603* 7,382*     Chemistry Recent Labs  Lab 11/12/20 2144 11/14/20 0448  NA 126* 128*  K 4.2 5.1  CL 93* 92*  CO2 23 20*  GLUCOSE 139* 304*  BUN 30* 24*  CREATININE 1.11 1.27*  CALCIUM 8.7* 8.8*  GFRNONAA >60 >60  ANIONGAP 10 16*    Recent Labs  Lab 11/12/20 2144  PROT 6.7  ALBUMIN 3.2*  AST 77*  ALT 82*  ALKPHOS 223*  BILITOT 0.3   Hematology Recent Labs  Lab 11/12/20 2144 11/14/20 0448  WBC 15.6* 20.5*  RBC 4.07* 3.59*  HGB 13.2 11.7*  HCT 38.1* 34.4*  MCV 93.6 95.8  MCH 32.4 32.6  MCHC 34.6 34.0  RDW 13.1 13.6  PLT 217 271   BNP Recent Labs  Lab 11/14/20 0448  BNP 859.3*    DDimer No results for input(s): DDIMER in the last 168 hours.   Radiology/Studies:  DG Chest Port 1 View  Result Date: 11/14/2020 CLINICAL DATA:  Shortness of breath, bilateral lower extremity swelling. EXAM: PORTABLE CHEST 1 VIEW COMPARISON:  November 12, 2020. FINDINGS: Stable cardiomegaly. No pneumothorax is noted. Mild perihilar and basilar interstitial densities are noted concerning for edema or possibly scarring. No pleural effusion is noted. Bony thorax is unremarkable. IMPRESSION: Mild perihilar and basilar interstitial densities are noted concerning for edema or possibly scarring. Aortic Atherosclerosis (ICD10-I70.0). Electronically Signed   By: Marijo Conception M.D.   On: 11/14/2020 08:58   DG Chest Portable 1 View  Result Date: 11/12/2020 CLINICAL DATA:  70 year old male with leg swelling. EXAM: PORTABLE CHEST 1 VIEW COMPARISON:  Chest CT dated 02/01/2020. FINDINGS: Bibasilar linear atelectasis/scarring. No focal consolidation, pleural effusion or pneumothorax. Mild cardiomegaly. Atherosclerotic calcification of the aorta. No acute osseous pathology. IMPRESSION: No active disease. Electronically Signed   By: Anner Crete M.D.   On: 11/12/2020 22:02   ECHOCARDIOGRAM COMPLETE  Result  Date: 11/14/2020    ECHOCARDIOGRAM REPORT   Patient Name:   LOVIS MORE Date of Exam: 11/14/2020 Medical Rec #:  [id number]       Height:       68.0 in Accession #:    [id number]      Weight:       239.6 lb Date of Birth:  07-25-1951       BSA:          2.207 m Patient Age:    21 years        BP:           163/89 mmHg Patient Gender: M               HR:           91 bpm. Exam Location:  Inpatient Procedure: 2D Echo, Color Doppler and Cardiac Doppler Indications:    Dyspnea R06.00  History:        Patient has no prior history of Echocardiogram examinations.                 Risk Factors:Hypertension and Diabetes. Cellulitis.  Sonographer:    Jonelle Sidle  [place] Referring Phys: [id number] Charlesetta Ivory GONFA IMPRESSIONS  1. Left ventricular ejection fraction, by estimation, is 40 to 45%. The left ventricle has mildly decreased function. The left ventricle demonstrates regional wall motion abnormalities (see scoring diagram/findings for description). There is mild left ventricular hypertrophy. Left ventricular diastolic parameters are consistent with Grade I diastolic dysfunction (impaired relaxation). Elevated left atrial pressure.  2. Right ventricular systolic function is normal. The right ventricular size is normal.  3. The mitral valve is normal in structure. No evidence of mitral valve regurgitation. No evidence of mitral stenosis.  4. The aortic valve is normal in structure. Aortic valve regurgitation is not visualized. Mild to moderate aortic valve sclerosis/calcification is present, without any evidence of aortic stenosis.  5. The inferior vena cava is normal in size with greater than 50% respiratory variability, suggesting right atrial pressure of 3 mmHg. FINDINGS  Left Ventricle: Left ventricular ejection fraction, by estimation, is 40 to 45%. The left ventricle has mildly decreased function. The left ventricle demonstrates regional wall motion abnormalities. The left ventricular internal cavity size was normal in  size. There is mild left ventricular hypertrophy. Left ventricular diastolic parameters are consistent with Grade I diastolic dysfunction (impaired relaxation). Elevated left atrial pressure.  LV Wall Scoring: The inferior wall and posterior wall are akinetic. Right Ventricle: The right ventricular size is normal. No increase in right ventricular wall thickness. Right ventricular systolic function is normal. Left Atrium: Left atrial size was normal in size. Right Atrium: Right atrial size was normal in size. Pericardium: There is no evidence of pericardial effusion. Mitral Valve: The mitral valve is normal in structure. No evidence of mitral valve regurgitation. No evidence of mitral valve stenosis. Tricuspid Valve: The tricuspid valve is normal in structure. Tricuspid valve regurgitation is not demonstrated. No evidence of tricuspid stenosis. Aortic Valve: The aortic valve is normal in structure. Aortic valve regurgitation is not visualized. Mild to moderate aortic valve sclerosis/calcification is present, without any evidence of aortic stenosis. Pulmonic Valve: The pulmonic valve was normal in structure. Pulmonic valve regurgitation is not visualized. No evidence of pulmonic stenosis. Aorta: The aortic root is normal in size and structure. Venous: The inferior vena cava is normal in size with greater than 50% respiratory variability, suggesting right atrial pressure of 3 mmHg. IAS/Shunts: No atrial level shunt detected by color flow Doppler.  LEFT VENTRICLE PLAX 2D LVIDd:         4.10 cm  Diastology LVIDs:         3.40 cm  LV e' medial:    3.92 cm/s LV PW:         1.20 cm  LV E/e' medial:  25.0 LV IVS:        1.40 cm  LV e' lateral:   5.00 cm/s LVOT diam:     2.10 cm  LV E/e' lateral: 19.6 LV SV:         63 LV SV Index:   29 LVOT Area:     3.46 cm  RIGHT VENTRICLE RV S prime:     14.50 cm/s TAPSE (M-mode): 1.6 cm LEFT ATRIUM             Index LA diam:        3.00 cm 1.36 cm/m LA Vol (A2C):   49.0 ml 22.20 ml/m  LA Vol (A4C):   55.3 ml 25.06 ml/m LA Biplane Vol: 55.5 ml 25.15 ml/m  AORTIC VALVE LVOT Vmax:   96.80 cm/s LVOT Vmean:  66.000  cm/s LVOT VTI:    0.183 m  AORTA Ao Root diam: 3.00 cm MITRAL VALVE MV Area (PHT): 6.32 cm     SHUNTS MV Decel Time: 120 msec     Systemic VTI:  0.18 m MV E velocity: 98.00 cm/s   Systemic Diam: 2.10 cm MV A velocity: 108.00 cm/s MV E/A ratio:  0.91 Candee Furbish MD Electronically signed by Candee Furbish MD Signature Date/Time: 11/14/2020/2:29:24 PM    Final      Assessment and Plan:   NSTEMI - Patient admitted with cellulitis and then developed acute respiratory distress today. Found to have abnormal EKG and markedly elevated troponin. - High-sensitivity troponin 6,603. Repeat pending. - No EKG yet but have asked RN to have this done. - Echo showed LVEF of 40-45% with akinesis of inferior wall and posterior wall as well as grade 1 diastolic function. EF down from 50-55% from last Echo in 2020 at [facility] (did have hypokinesis of inferolateral, inferoseptal, and inferior walls at that time). - No chest pain but still noticeably short of breath. - IV Heparin has been ordered. - Cpntinue Aspirin $RemoveBeforeDE'81mg'PIdnOfUjoGKYXmz$  daily.  - Will restart home Coreg 6.$RemoveBefo'25mg'uUoMrKUbAjo$  twice daily. - Will start Lipitor $RemoveBefor'80mg'tezAAjbvkRDZ$  daily and continue Zetia which he takes at home. Was on Pravastatin at home. - Possible cardiac catheterization on Monday depending on improvement in cellulitis. Patient also unsure right now if he would want cardiac catheterization.  Acute on Chronic Combined CHF - BNP elevated in the 800's. - Chest x-ray showed mild perihilar and basilar interstitial densities concerning for edema or possibly scarring. - Echo as above. - Received one dose of IV Lasix $Remove'40mg'QMogwbx$  this morning. Will give another dose this evening and reassess in the morning. - Restart home Coreg as above. - Home Telmisartan was held on admission due to "borderline renal function." OK to continue to hold for now to allow for diuresis  but would restart ARB prior to discharge given reduced EF. - Patient on Spironolactone $RemoveBeforeDEI'50mg'UVKDQBQmUIGmLYwH$  daily at home (per CareEverywhere). Will hold for now given slight increase in renal function. - Monitor daily weight, strict I/O's, and renal function.  Hypertension - BP elevated earlier today but currently well controlled. - Will restart home Coreg 6.$RemoveBefo'25mg'vcZdHiidSsI$  twice daily. - Home Telmisartan and Spironolactone currently on hold due to increase in creatinine.   Hyperlipidemia - On Pravastatin and Zetia at home. - Will start Lipitor $RemoveBefor'80mg'lYBDHjCKRZqT$  daily here and continue Zetia. - Will check fasting lipid panel in the morning.  Diabetes Mellitus - Management per primary team.  Right Lower Extremity Cellulitis  - Continue antibiotics per primary team.  Otherwise, per primary team.   Risk Assessment/Risk Scores:   TIMI Risk Score for Unstable Angina or Non-ST Elevation MI:   The patient's TIMI risk score is 5, which indicates a 26% risk of all cause mortality, new or recurrent myocardial infarction or need for urgent revascularization in the next 14 days.  New York Heart Association (NYHA) Functional Class NYHA Class I. No symptoms prior to admission but currently short of breath at rest.    For questions or updates, please contact [place] Please consult [email or web address] for contact info under    Signed, Darreld Mclean, PA-C  11/14/2020 8:53 PM   Personally seen and examined. Agree with above.   70 year old admitted with right lower extremity cellulitis with a history of CAD prior inferior MI with stenting to the RCA in 1993 followed by Dr. Atilano Median at [facility] being evaluated at the  request of Dr. Cyndia Skeeters for elevated troponin of 6000, decreased ejection fraction of 40 to 45%.  Non-ST elevation myocardial infarction.  Short of breath, wheezing.  Received Lasix.  Been getting IV antibiotics for his cellulitis.  See above for further details.  GEN: Well nourished, well developed, in no acute  distress  HEENT: normal  Neck: no JVD, carotid bruits, or masses Cardiac: RRR; no murmurs, rubs, or gallops Respiratory:  clear to auscultation bilaterally, normal work of breathing GI: soft, nontender, nondistended, + BS MS: no deformity or atrophy  Skin: Right lower extremity cellulitis severe Neuro:  Alert and Oriented x 3, Strength and sensation are intact Psych: euthymic mood, full affect  High-sensitivity troponin value was 6600-6300 Creatinine 1.27, glucose 304 sodium 128  EKG currently pending.  Assessment and plan  Non-ST elevation myocardial infarction -Markedly elevated troponin with ejection fraction of 40 to 45% with old inferior infarction.  This seems to be down from 50 to 55% on last echo at [facility].  Not having any chest pain but is having some shortness of breath.  IV heparin, aspirin, home carvedilol 6.25 twice a day, atorvastatin 80 mg with his Zetia.  Stop his pravastatin. -Consider cardiac catheterization on Monday if lower extremity cellulitis seems to be improving.  Acute on chronic combined heart failure -BNP elevated in the 800 range #Mild perihilar edema on chest x-ray personally reviewed -We will go ahead and administer 40 mg of IV Lasix again tonight.  Right lower extremity cellulitis -Per primary team.  IV antibiotics.  This may potentially delay cardiac catheterization.  Candee Furbish, MD

## 2020-11-14 NOTE — Progress Notes (Signed)
  Echocardiogram 2D Echocardiogram has been performed.  Darlina Sicilian M 11/14/2020, 12:50 PM

## 2020-11-14 NOTE — Progress Notes (Signed)
Pt states he drinks 2 beers every day and takes benadryl and mucinex, Dr. Cyndia Skeeters notified.  Ativan given and seems to be relaxing him some altho he is still wheezing.

## 2020-11-14 NOTE — Progress Notes (Signed)
CRITICAL TROP OF 6603 TEXTED TO DR GONFA.

## 2020-11-14 NOTE — Progress Notes (Signed)
   11/14/20 0810  Assess: MEWS Score  Temp (!) 97.5 F (36.4 C)  BP (!) 172/95  Pulse Rate (!) 116  Resp (!) 32  SpO2 99 %  Assess: MEWS Score  MEWS Temp 0  MEWS Systolic 0  MEWS Pulse 2  MEWS RR 2  MEWS LOC 0  MEWS Score 4  MEWS Score Color Red  Assess: if the MEWS score is Yellow or Red  Were vital signs taken at a resting state? Yes  Focused Assessment Change from prior assessment (see assessment flowsheet)  Early Detection of Sepsis Score *See Row Information* High  MEWS guidelines implemented *See Row Information* Yes  Treat  MEWS Interventions Administered prn meds/treatments;Other (Comment) (dr, RR contacted)  Pain Scale 0-10  Pain Score 1  Pain Type Acute pain  Pain Location Leg  Pain Orientation Right  Take Vital Signs  Increase Vital Sign Frequency  Red: Q 1hr X 4 then Q 4hr X 4, if remains red, continue Q 4hrs  Escalate  MEWS: Escalate Red: discuss with charge nurse/RN and provider, consider discussing with RRT  Notify: Charge Nurse/RN  Name of Charge Nurse/RN Notified Hulan Amato, RN  Date Charge Nurse/RN Notified 11/14/20  Time Charge Nurse/RN Notified 0800  Notify: Provider  Provider Name/Title Dr. Cyndia Skeeters  Date Provider Notified 11/14/20  Time Provider Notified [phone]  Notification Type  (chat and he came right up)  Notification Reason Change in status  Response See new orders  Date of Provider Response 11/14/20  Time of Provider Response 0758  Notify: Rapid Response  Name of Rapid Response RN Notified Kelli Churn, Rn  Date Rapid Response Notified 11/14/20  Time Rapid Response Notified 0800  Document  Patient Outcome Not stable and remains on department  Progress note created (see row info) Yes

## 2020-11-14 NOTE — Progress Notes (Signed)
Report given to [phone] nurse for pt transfer.

## 2020-11-14 NOTE — Progress Notes (Signed)
Called [phone] for report for transfer. Helle with RR in room with pt on Bipap, respiratory at bedside also. Pt appears to be resting comfortably at present. Primo reapplied.

## 2020-11-14 NOTE — Progress Notes (Signed)
ANTICOAGULATION CONSULT NOTE - Initial Consult  Pharmacy Consult for IV Heparin Indication: chest pain/ACS  Allergies  Allergen Reactions  . Iodinated Diagnostic Agents Hives    Patient Measurements: Height: 5\' 8"  (172.7 cm) Weight: 108.7 kg (239 lb 10.2 oz) IBW/kg (Calculated) : 68.4 Heparin Dosing Weight: 92.5 kg  Vital Signs: Temp: 98.1 F (36.7 C) (02/04 1559) Temp Source: Oral (02/04 1559) BP: 134/70 (02/04 1559) Pulse Rate: 94 (02/04 1559)  Labs: Recent Labs    11/12/20 2144 11/14/20 0448 11/14/20 1656  HGB 13.2 11.7*  --   HCT 38.1* 34.4*  --   PLT 217 271  --   CREATININE 1.11 1.27*  --   TROPONINIHS  --   --  6,603*    Estimated Creatinine Clearance: 63.8 mL/min (A) (by C-G formula based on SCr of 1.27 mg/dL (H)).   Medical History: Past Medical History:  Diagnosis Date  . Cellulitis and abscess of right leg 11/13/2020  . Class 2 obesity due to excess calories with body mass index (BMI) of 36.0 to 36.9 in adult 11/13/2020  . Dementia ([place])   . Diabetes mellitus without complication ([place])   . Hypertension     Assessment: 70 yr old man admitted on 11/12/20 with R leg cellulitis. This afternoon, troponin markedly elevated at 6603; Cardiology was consulted. Pharmacy was consulted to dose heparin for ACS.  Pt has been receiving enoxaparin (0.5 mg/kg, 50 mg) SQ daily for VTE prophylaxis, last today this afternoon at 1737 PM.  H/H 11.7/34.4, plt 271   Goal of Therapy:  Heparin level 0.3-0.7 units/ml Monitor platelets by anticoagulation protocol: Yes   Plan:  Start heparin infusion at 1150 units/hr (no bolus, since pt had enoxaparin dose late this afternoon) Check 6-hr heparin level Monitor daily heparin level, CBC Monitor for bleeding  Gillermina Hu, PharmD, BCPS, [facility] Clinical Pharmacist 11/14/2020,7:51 PM

## 2020-11-14 NOTE — Progress Notes (Signed)
RT placed patient on 4L [place]. Patient tolerating well at this time. RN and MD at bedside. RT will continue to monitor as needed.

## 2020-11-14 NOTE — Progress Notes (Signed)
Pt sweaty and having shortness of breath, Albuterol PRN tx started, resp called and Dr. Cyndia Skeeters notified.

## 2020-11-14 NOTE — Progress Notes (Signed)
Went to check on patient.  Currently off BiPAP to 4 L by nasal cannula.  No further respiratory distress.  He denies shortness of breath, chest pain, GI or UTI symptoms.  Weaned him down to 2 L and saturation remained stable at 97%.  Requested RN to wean him down further as tolerated. Echocardiogram just done and result pending. Updated patient's wife over the phone.

## 2020-11-14 NOTE — Progress Notes (Signed)
CXR done now, [facility] called with RR and will be up. Giving Lasix 40 IV.

## 2020-11-14 NOTE — Significant Event (Signed)
Rapid Response Event Note   Reason for Call :   Respiratory distress/ wheezes Patient admitted with right LE cellulitis  Initial Focused Assessment:  Patient lying in bed he is alert and oriented Upper airway wheezes Crackles in bases Increased work of breathing RR 30s He can speak in short phrases  BP 155/68  ST 102  RR 30  O2 sat 96% on 2L [place]   Interventions:  Albuterol 40mg  Lasix PCXR  52m post lasix and albuterol patient continues with increased work of breathing and upper airway wheeze  RT placed patient on Bipap 10/5 Fio2 50% Shortly after Bipap placed patient is resting comfortably, mild WOB.    1045 Transferred to Progressive care  Rm 3w31    Plan of Care:     Event Summary:   MD Notified: Cyndia Skeeters Call Time: 0825 Arrival Time: End Time: [place]  Raliegh Ip, RN

## 2020-11-14 NOTE — Progress Notes (Signed)
Patient placed on bipap for work of breathing. Rapid RN at bedside. Waiting for transfer to progressive floor.

## 2020-11-14 NOTE — Progress Notes (Signed)
Helle, RR, to room, called respiratory to come assess pt.

## 2020-11-15 ENCOUNTER — Inpatient Hospital Stay (HOSPITAL_COMMUNITY): Payer: HMO

## 2020-11-15 DIAGNOSIS — E1065 Type 1 diabetes mellitus with hyperglycemia: Secondary | ICD-10-CM | POA: Diagnosis not present

## 2020-11-15 DIAGNOSIS — L03116 Cellulitis of left lower limb: Secondary | ICD-10-CM

## 2020-11-15 DIAGNOSIS — G4733 Obstructive sleep apnea (adult) (pediatric): Secondary | ICD-10-CM

## 2020-11-15 DIAGNOSIS — L0390 Cellulitis, unspecified: Secondary | ICD-10-CM | POA: Diagnosis not present

## 2020-11-15 DIAGNOSIS — I5043 Acute on chronic combined systolic (congestive) and diastolic (congestive) heart failure: Secondary | ICD-10-CM

## 2020-11-15 DIAGNOSIS — Z9989 Dependence on other enabling machines and devices: Secondary | ICD-10-CM

## 2020-11-15 DIAGNOSIS — E871 Hypo-osmolality and hyponatremia: Secondary | ICD-10-CM

## 2020-11-15 DIAGNOSIS — L03115 Cellulitis of right lower limb: Secondary | ICD-10-CM | POA: Diagnosis not present

## 2020-11-15 DIAGNOSIS — I10 Essential (primary) hypertension: Secondary | ICD-10-CM | POA: Diagnosis not present

## 2020-11-15 LAB — GLUCOSE, CAPILLARY
Glucose-Capillary: 342 mg/dL — ABNORMAL HIGH (ref 70–99)
Glucose-Capillary: 320 mg/dL — ABNORMAL HIGH (ref 70–99)
Glucose-Capillary: 265 mg/dL — ABNORMAL HIGH (ref 70–99)
Glucose-Capillary: 219 mg/dL — ABNORMAL HIGH (ref 70–99)

## 2020-11-15 LAB — COMPREHENSIVE METABOLIC PANEL
ALT: 65 U/L — ABNORMAL HIGH (ref 0–44)
AST: 54 U/L — ABNORMAL HIGH (ref 15–41)
Albumin: 2.2 g/dL — ABNORMAL LOW (ref 3.5–5.0)
Alkaline Phosphatase: 244 U/L — ABNORMAL HIGH (ref 38–126)
Anion gap: 14 (ref 5–15)
BUN: 28 mg/dL — ABNORMAL HIGH (ref 8–23)
CO2: 21 mmol/L — ABNORMAL LOW (ref 22–32)
Calcium: 8.7 mg/dL — ABNORMAL LOW (ref 8.9–10.3)
Chloride: 95 mmol/L — ABNORMAL LOW (ref 98–111)
Creatinine, Ser: 1.22 mg/dL (ref 0.61–1.24)
GFR, Estimated: >60 mL/min (ref >60)
Glucose, Bld: 324 mg/dL — ABNORMAL HIGH (ref 70–99)
Potassium: 5 mmol/L (ref 3.5–5.1)
Sodium: 130 mmol/L — ABNORMAL LOW (ref 135–145)
Total Bilirubin: 1 mg/dL (ref 0.3–1.2)
Total Protein: 5.9 g/dL — ABNORMAL LOW (ref 6.5–8.1)

## 2020-11-15 LAB — CBC
HCT: 35.6 % — ABNORMAL LOW (ref 39.0–52.0)
Hemoglobin: 12.6 g/dL — ABNORMAL LOW (ref 13.0–17.0)
MCH: 33.1 pg (ref 26.0–34.0)
MCHC: 35.4 g/dL (ref 30.0–36.0)
MCV: 93.4 fL (ref 80.0–100.0)
Platelets: 368 10*3/uL (ref 150–400)
RBC: 3.81 MIL/uL — ABNORMAL LOW (ref 4.22–5.81)
RDW: 13.8 % (ref 11.5–15.5)
WBC: 23 10*3/uL — ABNORMAL HIGH (ref 4.0–10.5)
nRBC: 0 % (ref 0.0–0.2)

## 2020-11-15 LAB — HEPARIN LEVEL (UNFRACTIONATED)
Heparin Unfractionated: 0.33 IU/mL (ref 0.30–0.70)
Heparin Unfractionated: 0.14 IU/mL — ABNORMAL LOW (ref 0.30–0.70)
Heparin Unfractionated: 0.1 IU/mL — ABNORMAL LOW (ref 0.30–0.70)

## 2020-11-15 LAB — TROPONIN I (HIGH SENSITIVITY): Troponin I (High Sensitivity): 4065 ng/L (ref <18)

## 2020-11-15 LAB — LIPID PANEL
Cholesterol: 198 mg/dL (ref 0–200)
HDL: 11 mg/dL — ABNORMAL LOW (ref >40)
LDL Cholesterol: UNDETERMINED mg/dL (ref 0–99)
Total CHOL/HDL Ratio: 18 RATIO
Triglycerides: 433 mg/dL — ABNORMAL HIGH (ref <150)
VLDL: UNDETERMINED mg/dL (ref 0–40)

## 2020-11-15 LAB — LDL CHOLESTEROL, DIRECT: Direct LDL: 64.5 mg/dL (ref 0–99)

## 2020-11-15 LAB — PHOSPHORUS: Phosphorus: 3.6 mg/dL (ref 2.5–4.6)

## 2020-11-15 LAB — MAGNESIUM: Magnesium: 2.2 mg/dL (ref 1.7–2.4)

## 2020-11-15 MED ORDER — SODIUM CHLORIDE 0.9 % IV SOLN
3.0000 g | Freq: Four times a day (QID) | INTRAVENOUS | Status: AC
  Administered 2020-11-15 – 2020-11-19 (×18): 3 g via INTRAVENOUS
  Filled 2020-11-15 (×5): qty 3
  Filled 2020-11-15: qty 8
  Filled 2020-11-15: qty 3
  Filled 2020-11-15: qty 8
  Filled 2020-11-15 (×7): qty 3
  Filled 2020-11-15: qty 8
  Filled 2020-11-15: qty 3
  Filled 2020-11-15: qty 8
  Filled 2020-11-15 (×2): qty 3

## 2020-11-15 MED ORDER — HYDROCERIN EX CREA
TOPICAL_CREAM | Freq: Every day | CUTANEOUS | Status: DC
  Filled 2020-11-15 (×5): qty 113

## 2020-11-15 MED ORDER — FUROSEMIDE 10 MG/ML IJ SOLN
40.0000 mg | Freq: Once | INTRAMUSCULAR | Status: AC
  Administered 2020-11-15: 40 mg via INTRAVENOUS
  Filled 2020-11-15 (×2): qty 4

## 2020-11-15 MED ORDER — METOCLOPRAMIDE HCL 5 MG/ML IJ SOLN
5.0000 mg | Freq: Three times a day (TID) | INTRAMUSCULAR | Status: DC
  Administered 2020-11-15 – 2020-11-21 (×16): 5 mg via INTRAVENOUS
  Filled 2020-11-15 (×16): qty 2

## 2020-11-15 MED ORDER — METHYLPREDNISOLONE SODIUM SUCC 125 MG IJ SOLR
80.0000 mg | Freq: Once | INTRAMUSCULAR | Status: AC
  Administered 2020-11-15: 80 mg via INTRAVENOUS
  Filled 2020-11-15: qty 2

## 2020-11-15 MED ORDER — INSULIN ASPART 100 UNIT/ML ~~LOC~~ SOLN
10.0000 [IU] | Freq: Three times a day (TID) | SUBCUTANEOUS | Status: DC
  Administered 2020-11-15 – 2020-11-16 (×5): 10 [IU] via SUBCUTANEOUS

## 2020-11-15 MED ORDER — FUROSEMIDE 10 MG/ML IJ SOLN
40.0000 mg | Freq: Two times a day (BID) | INTRAMUSCULAR | Status: AC
  Administered 2020-11-15 (×2): 40 mg via INTRAVENOUS
  Filled 2020-11-15 (×2): qty 4

## 2020-11-15 MED ORDER — ALUM & MAG HYDROXIDE-SIMETH 200-200-20 MG/5ML PO SUSP: 30.0000 mL | Freq: Once | ORAL | Status: DC

## 2020-11-15 MED ORDER — INSULIN GLARGINE 100 UNIT/ML ~~LOC~~ SOLN
30.0000 [IU] | Freq: Two times a day (BID) | SUBCUTANEOUS | Status: DC
  Administered 2020-11-15 (×2): 30 [IU] via SUBCUTANEOUS
  Filled 2020-11-15 (×5): qty 0.3

## 2020-11-15 NOTE — Progress Notes (Addendum)
Floor coverage overnight event  Chart reviewed.  Patient admitted for NSTEMI, acute on chronic combined CHF, and right lower extremity cellulitis.  He is on IV heparin and receiving IV Lasix 40 mg twice daily.  Echo done yesterday showing LVEF 40 to 45%, left ventricular regional wall motion abnormalities, mild LVH, and grade 1 diastolic dysfunction.  Cardiology following and plan for [facility] on Monday.  Notified by RN that patient desatted to low/mid 80s on 2 L supplemental oxygen, he was placed on 5 L oxygen and sats did not improve much. He had increased work of breathing and was placed on BiPAP.  Patient seen and examined at bedside. Reports improvement of his breathing with BiPAP. Denies chest pain. Heart rate in the 80s. Satting 100% on BiPAP and appears comfortable. Respiratory rate in the low 20s. Blood pressure 160/86. On exam, JVD present. Very diminished breath sounds with mild wheezing appreciated upon auscultation of the lungs. Patient denies history of asthma or COPD but does report prior longstanding history of cigarette smoking. Noted to have edema and erythema of his right lower extremity.  Right lower extremity ultrasound was negative for DVT on 2/1 Per care everywhere.  -Give additional IV Lasix 40 mg -DuoNeb treatment -IV Solu-Medrol 80 mg x 1 -Stat chest x-ray -Stat EKG -Continue to monitor closely.  Update: -EKG showing sinus rhythm, RBBB.  No acute changes. -Chest x-ray showing cardiomegaly and vascular congestion.  Showing patchy left upper lobe and bibasilar opacities suspicious for infiltrate/pneumonia. -SARS-CoV-2 PCR test done at the time of admission on 2/2 was negative.  Does have leukocytosis on labs which could also be attributed to right lower extremity cellulitis.  Already on antibiotics (vancomycin and Unasyn).

## 2020-11-15 NOTE — Progress Notes (Addendum)
[place] for IV Heparin Indication: chest pain/ACS  Allergies  Allergen Reactions  . Iodinated Diagnostic Agents Hives    Patient Measurements: Height: 5\' 8"  (172.7 cm) Weight: 108.7 kg (239 lb 10.2 oz) IBW/kg (Calculated) : 68.4 Heparin Dosing Weight: 92.5 kg  Vital Signs: Temp: 97.4 F (36.3 C) (02/05 1114) Temp Source: Oral (02/05 1114) BP: 138/71 (02/05 1114) Pulse Rate: 86 (02/05 1114)  Labs: Recent Labs    11/12/20 2144 11/14/20 0448 11/14/20 1656 11/14/20 1916 11/15/20 0253 11/15/20 0802 11/15/20 1037  HGB 13.2 11.7*  --   --  12.6*  --   --   HCT 38.1* 34.4*  --   --  35.6*  --   --   PLT 217 271  --   --  368  --   --   HEPARINUNFRC  --   --   --   --  0.33  --  0.14*  CREATININE 1.11 1.27*  --   --  1.22  --   --   TROPONINIHS  --   --  6,603* 7,939*  --  4,065*  --     Estimated Creatinine Clearance: 66.4 mL/min (by C-G formula based on SCr of 1.22 mg/dL).   Assessment: 70 yr old man admitted on 11/12/20 with R leg cellulitis. This afternoon, troponin markedly elevated at 6603; Cardiology was consulted. Pharmacy was consulted to dose heparin for ACS.  Heparin confirmatory level subtherapeutic (0.14) on gtt at 1150 units/hr. No bleeding noted. No problems with infusion per RN  Goal of Therapy:  Heparin level 0.3-0.7 units/ml Monitor platelets by anticoagulation protocol: Yes   Plan:  Increase heparin infusion to 1350 units/hr F/u 8 hour heparin level  Larie Mathes A. Levada Dy, PharmD, BCPS, FNKF Clinical Pharmacist [place] Please utilize Amion for appropriate phone number to reach the unit pharmacist ([place])   11/15/2020,12:10 PM

## 2020-11-15 NOTE — Plan of Care (Signed)

## 2020-11-15 NOTE — Progress Notes (Signed)
Pt had an episode of projectile vomiting at 2130. Pt advised prior to that when he vomits its normally projectile. Pt did not take any PO meds last night other than something to sleep, stated he did not feel he could tolerate it on his stomach at the time. After vomiting pt requested milk and I advised the pt that milk would not settle his stomach. Pt also requested to go back on the BiPap, because he uses CPap at home and stated he got the best sleep the night before on the BiPap. Pt was satting at 74 or above at the time. I contacted RT about his request and due to him projectile vomiting, RT tech stated she did not feel it was safe at that time. Pt slept through the night and sats remained fine without CPap.

## 2020-11-15 NOTE — Plan of Care (Signed)
  Problem: Education: Goal: Knowledge of General Education information will improve Description: Including pain rating scale, medication(s)/side effects and non-pharmacologic comfort measures Outcome: Progressing   Problem: Coping: Goal: Level of anxiety will decrease Outcome: Progressing   Problem: Safety: Goal: Ability to remain free from injury will improve Outcome: Progressing   

## 2020-11-15 NOTE — Progress Notes (Signed)
Progress Note  Patient Name: Jeremy Hickman Date of Encounter: 11/15/2020  Primary Cardiologist: No primary care provider on file.   Subjective   "Nothing is right with me right now"  No active CP, no supine SOB. Leg not bothering him  Inpatient Medications    Scheduled Meds: . aspirin EC  81 mg Oral Daily  . atorvastatin  80 mg Oral Daily  . busPIRone  15 mg Oral QPM  . busPIRone  30 mg Oral Q0600  . carvedilol  6.25 mg Oral BID WC  . docusate sodium  100 mg Oral BID  . donepezil  5 mg Oral QHS  . ezetimibe  10 mg Oral Daily  . FLUoxetine  20 mg Oral Daily  . folic acid  1 mg Oral Daily  . guaiFENesin  600 mg Oral BID  . insulin aspart  0-20 Units Subcutaneous TID WC  . insulin aspart  0-5 Units Subcutaneous QHS  . insulin aspart  10 Units Subcutaneous TID WC  . insulin glargine  30 Units Subcutaneous BID  . loratadine  10 mg Oral Daily  . melatonin  3 mg Oral QHS  . multivitamin with minerals  1 tablet Oral Daily  . nutrition supplement (JUVEN)  1 packet Oral BID BM  . pantoprazole  40 mg Oral Daily  . Ensure Max Protein  11 oz Oral QHS  . sodium chloride flush  3 mL Intravenous Q12H  . terazosin  5 mg Oral QHS  . thiamine  100 mg Oral Daily   Or  . thiamine  100 mg Intravenous Daily   Continuous Infusions: . sodium chloride 10 mL/hr at 11/14/20 0151  . ampicillin-sulbactam (UNASYN) IV 3 g (11/15/20 0854)  . heparin 1,150 Units/hr (11/15/20 0855)  . vancomycin Stopped (11/14/20 2045)   PRN Meds: acetaminophen **OR** acetaminophen, alum & mag hydroxide-simeth, bisacodyl, diphenhydrAMINE, hydrALAZINE, HYDROcodone-acetaminophen, ipratropium-albuterol, LORazepam **OR** LORazepam, morphine injection, nitroGLYCERIN, ondansetron **OR** ondansetron (ZOFRAN) IV, polyethylene glycol, temazepam   Vital Signs    Vitals:   11/14/20 1559 11/14/20 2010 11/14/20 2310 11/15/20 0757  BP: 134/70 132/71 (!) 147/84 (!) 151/81  Pulse: 94   94  Resp: 20 (!) 22 20 (!) 24   Temp: 98.1 F (36.7 C)   98 F (36.7 C)  TempSrc: Oral   Oral  SpO2: 99% 98% 96% 94%  Weight:      Height:        Intake/Output Summary (Last 24 hours) at 11/15/2020 0856 Last data filed at 11/15/2020 0855 Gross per 24 hour  Intake 1558.45 ml  Output -  Net 1558.45 ml   Filed Weights   11/12/20 2316 11/13/20 1526  Weight: 109.8 kg 108.7 kg    Telemetry    SR - Personally Reviewed  ECG    SR, RBBB QRS 136 ms, anteroseptal and inferior infarct pattern, T wave abnormality laterally - Personally Reviewed  Physical Exam   GEN: No acute distress.   Neck: No JVD Cardiac: regular rhythm, normal rate, no murmurs, rubs, or gallops.  Respiratory: Clear to auscultation bilaterally. GI: Soft, nontender, non-distended  MS: 1 + bilateral edema. RLE is warm, red, and tender. Neuro:  Nonfocal  Psych: Normal affect   Labs    Chemistry Recent Labs  Lab 11/12/20 2144 11/14/20 0448 11/15/20 0253  NA 126* 128* 130*  K 4.2 5.1 5.0  CL 93* 92* 95*  CO2 23 20* 21*  GLUCOSE 139* 304* 324*  BUN 30* 24* 28*  CREATININE 1.11 1.27*  1.22  CALCIUM 8.7* 8.8* 8.7*  PROT 6.7  --  5.9*  ALBUMIN 3.2*  --  2.2*  AST 77*  --  54*  ALT 82*  --  65*  ALKPHOS 223*  --  244*  BILITOT 0.3  --  1.0  GFRNONAA >60 >60 >60  ANIONGAP 10 16* 14     Hematology Recent Labs  Lab 11/12/20 2144 11/14/20 0448 11/15/20 0253  WBC 15.6* 20.5* 23.0*  RBC 4.07* 3.59* 3.81*  HGB 13.2 11.7* 12.6*  HCT 38.1* 34.4* 35.6*  MCV 93.6 95.8 93.4  MCH 32.4 32.6 33.1  MCHC 34.6 34.0 35.4  RDW 13.1 13.6 13.8  PLT 217 271 368    Cardiac EnzymesNo results for input(s): TROPONINI in the last 168 hours. No results for input(s): TROPIPOC in the last 168 hours.   BNP Recent Labs  Lab 11/14/20 0448  BNP 859.3*     DDimer No results for input(s): DDIMER in the last 168 hours.   Radiology    DG Chest Port 1 View  Result Date: 11/14/2020 CLINICAL DATA:  Shortness of breath, bilateral lower extremity  swelling. EXAM: PORTABLE CHEST 1 VIEW COMPARISON:  November 12, 2020. FINDINGS: Stable cardiomegaly. No pneumothorax is noted. Mild perihilar and basilar interstitial densities are noted concerning for edema or possibly scarring. No pleural effusion is noted. Bony thorax is unremarkable. IMPRESSION: Mild perihilar and basilar interstitial densities are noted concerning for edema or possibly scarring. Aortic Atherosclerosis (ICD10-I70.0). Electronically Signed   By: Marijo Conception M.D.   On: 11/14/2020 08:58   ECHOCARDIOGRAM COMPLETE  Result Date: 11/14/2020    ECHOCARDIOGRAM REPORT   Patient Name:   Jeremy Hickman Date of Exam: 11/14/2020 Medical Rec #:  [id number]       Height:       68.0 in Accession #:    [id number]      Weight:       239.6 lb Date of Birth:  February 07, 1951       BSA:          2.207 m Patient Age:    70 years        BP:           163/89 mmHg Patient Gender: M               HR:           91 bpm. Exam Location:  Inpatient Procedure: 2D Echo, Color Doppler and Cardiac Doppler Indications:    Dyspnea R06.00  History:        Patient has no prior history of Echocardiogram examinations.                 Risk Factors:Hypertension and Diabetes. Cellulitis.  Sonographer:    Darlina Sicilian RDCS Referring Phys: [id number] Charlesetta Ivory GONFA IMPRESSIONS  1. Left ventricular ejection fraction, by estimation, is 40 to 45%. The left ventricle has mildly decreased function. The left ventricle demonstrates regional wall motion abnormalities (see scoring diagram/findings for description). There is mild left ventricular hypertrophy. Left ventricular diastolic parameters are consistent with Grade I diastolic dysfunction (impaired relaxation). Elevated left atrial pressure.  2. Right ventricular systolic function is normal. The right ventricular size is normal.  3. The mitral valve is normal in structure. No evidence of mitral valve regurgitation. No evidence of mitral stenosis.  4. The aortic valve is normal in structure. Aortic  valve regurgitation is not visualized. Mild to moderate aortic valve sclerosis/calcification is present, without any  evidence of aortic stenosis.  5. The inferior vena cava is normal in size with greater than 50% respiratory variability, suggesting right atrial pressure of 3 mmHg. FINDINGS  Left Ventricle: Left ventricular ejection fraction, by estimation, is 40 to 45%. The left ventricle has mildly decreased function. The left ventricle demonstrates regional wall motion abnormalities. The left ventricular internal cavity size was normal in size. There is mild left ventricular hypertrophy. Left ventricular diastolic parameters are consistent with Grade I diastolic dysfunction (impaired relaxation). Elevated left atrial pressure.  LV Wall Scoring: The inferior wall and posterior wall are akinetic. Right Ventricle: The right ventricular size is normal. No increase in right ventricular wall thickness. Right ventricular systolic function is normal. Left Atrium: Left atrial size was normal in size. Right Atrium: Right atrial size was normal in size. Pericardium: There is no evidence of pericardial effusion. Mitral Valve: The mitral valve is normal in structure. No evidence of mitral valve regurgitation. No evidence of mitral valve stenosis. Tricuspid Valve: The tricuspid valve is normal in structure. Tricuspid valve regurgitation is not demonstrated. No evidence of tricuspid stenosis. Aortic Valve: The aortic valve is normal in structure. Aortic valve regurgitation is not visualized. Mild to moderate aortic valve sclerosis/calcification is present, without any evidence of aortic stenosis. Pulmonic Valve: The pulmonic valve was normal in structure. Pulmonic valve regurgitation is not visualized. No evidence of pulmonic stenosis. Aorta: The aortic root is normal in size and structure. Venous: The inferior vena cava is normal in size with greater than 50% respiratory variability, suggesting right atrial pressure of 3 mmHg.  IAS/Shunts: No atrial level shunt detected by color flow Doppler.  LEFT VENTRICLE PLAX 2D LVIDd:         4.10 cm  Diastology LVIDs:         3.40 cm  LV e' medial:    3.92 cm/s LV PW:         1.20 cm  LV E/e' medial:  25.0 LV IVS:        1.40 cm  LV e' lateral:   5.00 cm/s LVOT diam:     2.10 cm  LV E/e' lateral: 19.6 LV SV:         63 LV SV Index:   29 LVOT Area:     3.46 cm  RIGHT VENTRICLE RV S prime:     14.50 cm/s TAPSE (M-mode): 1.6 cm LEFT ATRIUM             Index LA diam:        3.00 cm 1.36 cm/m LA Vol (A2C):   49.0 ml 22.20 ml/m LA Vol (A4C):   55.3 ml 25.06 ml/m LA Biplane Vol: 55.5 ml 25.15 ml/m  AORTIC VALVE LVOT Vmax:   96.80 cm/s LVOT Vmean:  66.000 cm/s LVOT VTI:    0.183 m  AORTA Ao Root diam: 3.00 cm MITRAL VALVE MV Area (PHT): 6.32 cm     SHUNTS MV Decel Time: 120 msec     Systemic VTI:  0.18 m MV E velocity: 98.00 cm/s   Systemic Diam: 2.10 cm MV A velocity: 108.00 cm/s MV E/A ratio:  0.91 Candee Furbish MD Electronically signed by Candee Furbish MD Signature Date/Time: 11/14/2020/2:29:24 PM    Final     Cardiac Studies   Echo   1. Left ventricular ejection fraction, by estimation, is 40 to 45%. The  left ventricle has mildly decreased function. The left ventricle  demonstrates regional wall motion abnormalities (see scoring  diagram/findings for description).  There is mild left  ventricular hypertrophy. Left ventricular diastolic parameters are  consistent with Grade I diastolic dysfunction (impaired relaxation).  Elevated left atrial pressure.   2. Right ventricular systolic function is normal. The right ventricular  size is normal.   3. The mitral valve is normal in structure. No evidence of mitral valve  regurgitation. No evidence of mitral stenosis.   4. The aortic valve is normal in structure. Aortic valve regurgitation is  not visualized. Mild to moderate aortic valve sclerosis/calcification is  present, without any evidence of aortic stenosis.   5. The inferior vena cava  is normal in size with greater than 50%  respiratory variability, suggesting right atrial pressure of 3 mmHg.    Patient Profile     Jeremy Hickman is a 70 y.o. 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 Cyndia Skeeters.  Assessment & Plan   Principal Problem:   Cellulitis Active Problems:   Cognitive complaints   Essential hypertension   Hyperlipidemia   Class 2 obesity due to excess calories with body mass index (BMI) of 36.0 to 36.9 in adult   Cellulitis of right lower limb   NSTEMI  - High-sensitivity troponin 6,603 >> 7382 - Echo showed LVEF of 40-45% with akinesis of inferior wall and posterior wall as well as grade 1 diastolic function. EF down from 50-55% from last Echo in 2020 at [facility] (did have hypokinesis of inferolateral, inferoseptal, and inferior walls at that time). - IV Heparin - Aspirin 81mg  daily.  -  Coreg 6.25mg  twice daily. -  Lipitor 80mg  daily and continue Zetia which he takes at home. Was on Pravastatin at home. - Possible cardiac catheterization on Monday depending on improvement in cellulitis. Still looks significant.   Acute on Chronic Combined CHF - BNP elevated in the 800's. - IV lasix 40 mg BID today. Reassess tomorrow. No I/O, unable to assess UOP for response. Clinically, will monitor for improvement. - Restart home Coreg as above. - Home Telmisartan was held on admission due to "borderline renal function." OK to continue to hold for now to allow for diuresis but would restart ARB prior to discharge given reduced EF. - Monitor daily weight, strict I/O's, and renal function.   Hypertension - BP elevated earlier today but currently well controlled. - home Coreg 6.25mg  twice daily. - Home Telmisartan and Spironolactone  currently on hold due to increase in creatinine.    Hyperlipidemia - On Pravastatin and Zetia at home. - Will start Lipitor 80mg  daily here and continue Zetia.   Diabetes Mellitus - Management per primary team.   Right Lower Extremity Cellulitis  - Continue antibiotics per primary team.   Otherwise, per primary team.      For questions or updates, please contact [place] Please consult [email or web address] for contact info under        Signed, Elouise Munroe, MD  11/15/2020, 8:56 AM

## 2020-11-15 NOTE — Progress Notes (Signed)
PROGRESS NOTE  MCLAIN BRADWAY [id number] DOB: [birth year]   PCP: Malachi Pro [name], PA-C  Patient is from: Home. Independently ambulates at baseline.  DOA: 11/12/2020 LOS: 2  Chief complaints: Right leg swelling, redness and weeping  Brief Narrative / Interim history: 70 year old male with PMH of OSA on CPAP, HTN, DM-2, morbid obesity, anxiety and dementia presented to [facility] ED with RLE swelling, redness, weeping, pain and difficulty bearing weight, and admitted for cellulitis after he failed outpatient treatment with p.o. clindamycin. His LE [place] was negative for DVT on 11/11/2020 at [facility].  Initially started on IV ceftriaxone but antibiotics cannulated to cefepime and vancomycin.  The next day, patient developed respiratory distress. CXR without acute finding. BNP elevated to 800.  Respiratory distress improved after brief BiPAP, IV Lasix, DuoNeb and anxiolytics.  TTE showed LVEF of 40 to 45%, G1 DD and RWMA.  Troponin elevated to 6600.  Started on heparin drip and cardiology consulted.  Subjective: Seen and examined earlier this morning.  Reportedly had projectile emesis last night.  He reports heartburn but denies chest pain, shortness of breath and nausea, abdominal pain.  He says he is passing some gas but has not had bowel movement yet.  Objective: Vitals:   11/14/20 2010 11/14/20 2310 11/15/20 0757 11/15/20 1114  BP: 132/71 (!) 147/84 (!) 151/81 138/71  Pulse:   94 86  Resp: (!) 22 20 (!) 24 20  Temp:   98 F (36.7 C) (!) 97.4 F (36.3 C)  TempSrc:   Oral Oral  SpO2: 98% 96% 94% 92%  Weight:      Height:        Intake/Output Summary (Last 24 hours) at 11/15/2020 1328 Last data filed at 11/15/2020 0855 Gross per 24 hour  Intake 1558.45 ml  Output -  Net 1558.45 ml   Filed Weights   11/12/20 2316 11/13/20 1526  Weight: 109.8 kg 108.7 kg    Examination:  GENERAL: No apparent distress.  Nontoxic. HEENT: MMM.  Vision and hearing grossly intact.  NECK: Supple.  No  apparent JVD.  RESP: 96% on 3 L.  No IWOB.  Fair aeration bilaterally. CVS:  RRR. Heart sounds normal.  ABD/GI/GU: BS+. Abd soft.  Nontender.  Slightly distended. MSK/EXT:  Moves extremities.  1+ edema with erythema in RLE (improved). SKIN: no apparent skin lesion or wound NEURO: Awake, alert and oriented appropriately.  No apparent focal neuro deficit. PSYCH: Calm. Normal affect.  Procedures:  None  Microbiology summarized: COVID-19 PCR nonreactive. Blood cultures NGTD.  Assessment & Plan: Non-STEMI in patient with remote history of RCA MI: No chest pain but respiratory distress, diaphoresis and emesis.  Twelve-lead EKG reviewed by me shows sinus rhythm with RBBB and PVC unchanged from his old EKG.  Troponin peaked at 7400 and trended down to 4000. TC 198.  HDL 11.  TG 433.  TTE as above.  Patient without chest pain. -Continue Coreg, IV heparin and high intensity statin. -As needed nitroglycerin -Possible LHC on Monday per cardiology  Acute on chronic combined CHF: TTE as above. BNP 860 (no prior to compare to).  No significant pulmonary edema on CXR.  Has some RLE swelling although in the setting of cellulitis and chronic venous insufficiency.  Started on IV Lasix.  I&O incomplete.  Renal function improved. -Continue IV Lasix 40 mg twice daily -GDMT-on Coreg. ZHOme ARB on hold -Closely monitor fluid status, renal functions and electrolytes  Sepsis due to RLE Cellulitis: POA. Was tachycardic with leukocytosis on admission.  Cellulitis improving. -Progressively worsened despite p.o. clindamycin outpatient. -RLE [place] negative for DVT on 2/1 per care everywhere. -Continue vancomycin and cefepime-we will DC vancomycin if MRSA PCR negative.  Essential hypertension: Normotensive. -Coreg, Terazosin and Lasix as above -On my card is on hold   Controlled IDDM-1 with hyperglycemia and hyperlipidemia: A1c 6.7%. Recent Labs  Lab 11/14/20 1247 11/14/20 1558 11/14/20 2203 11/15/20 0607  11/15/20 1113  GLUCAP 327* 332* 348* 342* 320*  -Increased SSI to resistant -Increase NovoLog from 8 to 10 units -Increase Lantus from 20 to 30 units twice daily -High intensity statin as above -Further adjustment as appropriate  Elevated liver enzymes: Could be due to non-STEMI/sepsis.  Improving. -Continue monitoring  Hyponatremia: Na 130.  Corrects to 134 for hyperglycemia. -Continue monitoring  Dementia without behavioral disturbance: Fairly oriented. -Continue Buspar, Prozac, Restoril, Melatonin, trazodone -Continue Aricept  Anxiety/insomnia:  -Continue home BuSpar, Prozac, trazodone, Restoril and melatonin -Continue as needed Ativan  Alcohol use: Reports drinking 2 beers a day. Not sure if he is honest with [place]. His anxiety could be withdrawal. -Start CIWA protocol with as needed Ativan  OSA on CPAP -Continue nightly CPAP  Morbid obesity -Weight loss should be encouraged -Outpatient PCP/bariatric medicine f/u encouraged Body mass index is 36.44 kg/m. Nutrition Problem: Increased nutrient needs Etiology: acute illness Signs/Symptoms: estimated needs Interventions: MVI,Juven,Premier Protein   DVT prophylaxis:  Subcu Lovenox  Code Status: DNR/DNI Family Communication: Updated patient's wife over the phone on 2/4.  Did not pick up the phone on 2/5. Level of care: Progressive Status is: Inpatient  Remains inpatient appropriate because:Hemodynamically unstable, Ongoing diagnostic testing needed not appropriate for outpatient work up, Unsafe d/c plan, IV treatments appropriate due to intensity of illness or inability to take PO and Inpatient level of care appropriate due to severity of illness   Dispo: The patient is from: Home              Anticipated d/c is to: Home              Anticipated d/c date is: 2 days              Patient currently is not medically stable to d/c.   Difficult to place patient No       Consultants:  Cardiology   Sch Meds:   Scheduled Meds: . aspirin EC  81 mg Oral Daily  . atorvastatin  80 mg Oral Daily  . busPIRone  15 mg Oral QPM  . busPIRone  30 mg Oral Q0600  . carvedilol  6.25 mg Oral BID WC  . docusate sodium  100 mg Oral BID  . donepezil  5 mg Oral QHS  . ezetimibe  10 mg Oral Daily  . FLUoxetine  20 mg Oral Daily  . folic acid  1 mg Oral Daily  . furosemide  40 mg Intravenous BID  . guaiFENesin  600 mg Oral BID  . hydrocerin   Topical Daily  . insulin aspart  0-20 Units Subcutaneous TID WC  . insulin aspart  0-5 Units Subcutaneous QHS  . insulin aspart  10 Units Subcutaneous TID WC  . insulin glargine  30 Units Subcutaneous BID  . loratadine  10 mg Oral Daily  . melatonin  3 mg Oral QHS  . metoCLOPramide (REGLAN) injection  5 mg Intravenous Q8H  . multivitamin with minerals  1 tablet Oral Daily  . nutrition supplement (JUVEN)  1 packet Oral BID BM  . pantoprazole  40 mg Oral Daily  .  Ensure Max Protein  11 oz Oral QHS  . sodium chloride flush  3 mL Intravenous Q12H  . terazosin  5 mg Oral QHS  . thiamine  100 mg Oral Daily   Or  . thiamine  100 mg Intravenous Daily   Continuous Infusions: . sodium chloride 10 mL/hr at 11/14/20 0151  . ampicillin-sulbactam (UNASYN) IV 3 g (11/15/20 0854)  . heparin 1,150 Units/hr (11/15/20 0855)  . vancomycin Stopped (11/14/20 2045)   PRN Meds:.acetaminophen **OR** acetaminophen, alum & mag hydroxide-simeth, bisacodyl, diphenhydrAMINE, hydrALAZINE, HYDROcodone-acetaminophen, ipratropium-albuterol, LORazepam **OR** LORazepam, morphine injection, nitroGLYCERIN, ondansetron **OR** ondansetron (ZOFRAN) IV, polyethylene glycol, temazepam  Antimicrobials: Anti-infectives (From admission, onward)   Start     Dose/Rate Route Frequency Ordered Stop   11/15/20 0800  Ampicillin-Sulbactam (UNASYN) 3 g in sodium chloride 0.9 % 100 mL IVPB        3 g 200 mL/hr over 30 Minutes Intravenous Every 6 hours 11/15/20 0726     11/14/20 1630  vancomycin (VANCOREADY) IVPB  1500 mg/300 mL        1,500 mg 150 mL/hr over 120 Minutes Intravenous Every 24 hours 11/13/20 1542     11/13/20 2300  cefTRIAXone (ROCEPHIN) 1 g in sodium chloride 0.9 % 100 mL IVPB  Status:  Discontinued        1 g 200 mL/hr over 30 Minutes Intravenous Every 24 hours 11/13/20 1158 11/13/20 1510   11/13/20 1600  vancomycin (VANCOCIN) 2,250 mg in sodium chloride 0.9 % 500 mL IVPB        2,250 mg 250 mL/hr over 120 Minutes Intravenous  Once 11/13/20 1510 11/13/20 1855   11/13/20 1600  ceFEPIme (MAXIPIME) 2 g in sodium chloride 0.9 % 100 mL IVPB  Status:  Discontinued        2 g 200 mL/hr over 30 Minutes Intravenous  Once 11/13/20 1510 11/13/20 1542   11/13/20 1600  ceFEPIme (MAXIPIME) 2 g in sodium chloride 0.9 % 100 mL IVPB  Status:  Discontinued        2 g 200 mL/hr over 30 Minutes Intravenous Every 8 hours 11/13/20 1542 11/15/20 0726   11/13/20 0000  cefTRIAXone (ROCEPHIN) 2 g in sodium chloride 0.9 % 100 mL IVPB        2 g 200 mL/hr over 30 Minutes Intravenous  Once 11/12/20 2352 11/13/20 0042       I have personally reviewed the following labs and images: CBC: Recent Labs  Lab 11/12/20 2144 11/14/20 0448 11/15/20 0253  WBC 15.6* 20.5* 23.0*  NEUTROABS 13.2*  --   --   HGB 13.2 11.7* 12.6*  HCT 38.1* 34.4* 35.6*  MCV 93.6 95.8 93.4  PLT 217 271 368   BMP &GFR Recent Labs  Lab 11/12/20 2144 11/14/20 0448 11/15/20 0253  NA 126* 128* 130*  K 4.2 5.1 5.0  CL 93* 92* 95*  CO2 23 20* 21*  GLUCOSE 139* 304* 324*  BUN 30* 24* 28*  CREATININE 1.11 1.27* 1.22  CALCIUM 8.7* 8.8* 8.7*  MG  --   --  2.2  PHOS  --   --  3.6   Estimated Creatinine Clearance: 66.4 mL/min (by C-G formula based on SCr of 1.22 mg/dL). Liver & Pancreas: Recent Labs  Lab 11/12/20 2144 11/15/20 0253  AST 77* 54*  ALT 82* 65*  ALKPHOS 223* 244*  BILITOT 0.3 1.0  PROT 6.7 5.9*  ALBUMIN 3.2* 2.2*   No results for input(s): LIPASE, AMYLASE in the last 168 hours. No  results for input(s):  AMMONIA in the last 168 hours. Diabetic: Recent Labs    11/13/20 1256  HGBA1C 6.7*   Recent Labs  Lab 11/14/20 1247 11/14/20 1558 11/14/20 2203 11/15/20 0607 11/15/20 1113  GLUCAP 327* 332* 348* 342* 320*   Cardiac Enzymes: No results for input(s): CKTOTAL, CKMB, CKMBINDEX, TROPONINI in the last 168 hours. No results for input(s): PROBNP in the last 8760 hours. Coagulation Profile: No results for input(s): INR, PROTIME in the last 168 hours. Thyroid Function Tests: Recent Labs    11/13/20 1256  TSH 1.931   Lipid Profile: Recent Labs    11/15/20 0253  CHOL 198  HDL 11*  LDLCALC UNABLE TO CALCULATE IF TRIGLYCERIDE OVER 400 mg/dL  TRIG 829433*  CHOLHDL 56.218.0  LDLDIRECT 64.5   Anemia Panel: No results for input(s): VITAMINB12, FOLATE, FERRITIN, TIBC, IRON, RETICCTPCT in the last 72 hours. Urine analysis:    Component Value Date/Time   COLORURINE YELLOW 11/12/2020 1832   APPEARANCEUR CLEAR 11/12/2020 1832   LABSPEC 1.015 11/12/2020 1832   PHURINE 5.0 11/12/2020 1832   GLUCOSEU NEGATIVE 11/12/2020 1832   HGBUR NEGATIVE 11/12/2020 1832   BILIRUBINUR NEGATIVE 11/12/2020 1832   KETONESUR NEGATIVE 11/12/2020 1832   PROTEINUR NEGATIVE 11/12/2020 1832   NITRITE NEGATIVE 11/12/2020 1832   LEUKOCYTESUR NEGATIVE 11/12/2020 1832   Sepsis Labs: Invalid input(s): PROCALCITONIN, LACTICIDVEN  Microbiology: Recent Results (from the past 240 hour(s))  Culture, blood (routine x 2)     Status: None (Preliminary result)   Collection Time: 11/12/20  9:36 PM   Specimen: BLOOD  Result Value Ref Range Status   Specimen Description   Final    BLOOD Blood Culture adequate volume Performed at [facility] [place], [address] [place], [place] [address]    Special Requests   Final    BOTTLES DRAWN AEROBIC AND ANAEROBIC RIGHT ANTECUBITAL Performed at [facility] [place], [address] [place], [place] [address]    Culture   Final    NO GROWTH 2 DAYS Performed at [facility], [address] [place], [place] [address]    Report Status PENDING  Incomplete  SARS CORONAVIRUS 2 (TAT 6-24 HRS) Nasopharyngeal     Status: None   Collection Time: 11/12/20  9:44 PM   Specimen: Nasopharyngeal  Result Value Ref Range Status   SARS Coronavirus 2 NEGATIVE NEGATIVE Final    Comment: (NOTE) SARS-CoV-2 target nucleic acids are NOT DETECTED.  The SARS-CoV-2 RNA is generally detectable in upper and lower respiratory specimens during the acute phase of infection. Negative results do not preclude SARS-CoV-2 infection, do not rule out co-infections with other pathogens, and should not be used as the sole basis for treatment or other patient management decisions. Negative results must be combined with clinical observations, patient history, and epidemiological information. The expected result is Negative.  Fact Sheet for Patients: HairSlick.nohttps://www.fda.gov/media/138098/download  Fact Sheet for Healthcare Providers: quierodirigir.comhttps://www.fda.gov/media/138095/download  This test is not yet approved or cleared by the [place] FDA and  has been authorized for detection and/or diagnosis of SARS-CoV-2 by FDA under an Emergency Use Authorization (EUA). This EUA will remain  in effect (meaning this test can be used) for the duration of the COVID-19 declaration under Se ction 564(b)(1) of the Act, 21 U.S.C. section 360bbb-3(b)(1), unless the authorization is terminated or revoked sooner.  Performed at [facility], [address] [place], [place] [address]   Culture, blood (routine x 2)  Status: None (Preliminary result)   Collection Time: 11/12/20 10:11 PM   Specimen: BLOOD  Result Value Ref Range Status   Specimen Description   Final    BLOOD BLOOD RIGHT HAND Performed at [facility], [address] [place], [place] [address]    Special Requests   Final    BOTTLES DRAWN AEROBIC AND ANAEROBIC Blood Culture adequate volume Performed at [facility], [address] [place], [place] [address]    Culture   Final    NO GROWTH 2 DAYS Performed at [facility], [place] [address] [place], [place] [address]    Report Status PENDING  Incomplete    Radiology Studies: No results found.  [name]. [place]  If 7PM-7AM, please contact night-coverage [email or web address] 11/15/2020, 1:28 PM

## 2020-11-15 NOTE — Progress Notes (Signed)
[place] for IV Heparin Indication: chest pain/ACS  Allergies  Allergen Reactions  . Iodinated Diagnostic Agents Hives    Patient Measurements: Height: 5\' 8"  (172.7 cm) Weight: 108.7 kg (239 lb 10.2 oz) IBW/kg (Calculated) : 68.4 Heparin Dosing Weight: 92.5 kg  Vital Signs: Temp: 98.9 F (37.2 C) (02/05 2019) Temp Source: Oral (02/05 2019) BP: 160/86 (02/05 2019) Pulse Rate: 83 (02/05 2019)  Labs: Recent Labs    11/12/20 2144 11/14/20 0448 11/14/20 1656 11/14/20 1916 11/15/20 0253 11/15/20 0802 11/15/20 1037 11/15/20 2013  HGB 13.2 11.7*  --   --  12.6*  --   --   --   HCT 38.1* 34.4*  --   --  35.6*  --   --   --   PLT 217 271  --   --  368  --   --   --   HEPARINUNFRC  --   --   --   --  0.33  --  0.14* 0.10*  CREATININE 1.11 1.27*  --   --  1.22  --   --   --   TROPONINIHS  --   --  6,603* 4,825*  --  4,065*  --   --     Estimated Creatinine Clearance: 66.4 mL/min (by C-G formula based on SCr of 1.22 mg/dL).   Assessment: 70 yr old man admitted on 11/12/20 with R leg cellulitis. This afternoon, troponin markedly elevated at 6603; Cardiology was consulted. Pharmacy was consulted to dose heparin for ACS.  Heparin level this evening is low at 0.10 on gtt at 1350 units/hr. No bleeding noted.   Goal of Therapy:  Heparin level 0.3-0.7 units/ml Monitor platelets by anticoagulation protocol: Yes   Plan:  Increase heparin infusion to 1650 units/hr F/u 8 hour heparin level  Erin Hearing PharmD., BCPS Clinical Pharmacist 11/15/2020 8:54 PM

## 2020-11-15 NOTE — Progress Notes (Signed)
[place] for IV Heparin Indication: chest pain/ACS  Allergies  Allergen Reactions  . Iodinated Diagnostic Agents Hives    Patient Measurements: Height: 5\' 8"  (172.7 cm) Weight: 108.7 kg (239 lb 10.2 oz) IBW/kg (Calculated) : 68.4 Heparin Dosing Weight: 92.5 kg  Vital Signs: Temp: 98.1 F (36.7 C) (02/04 1559) Temp Source: Oral (02/04 1559) BP: 147/84 (02/04 2310) Pulse Rate: 94 (02/04 1559)  Labs: Recent Labs    11/12/20 2144 11/14/20 0448 11/14/20 1656 11/14/20 1916 11/15/20 0253  HGB 13.2 11.7*  --   --  12.6*  HCT 38.1* 34.4*  --   --  35.6*  PLT 217 271  --   --  368  HEPARINUNFRC  --   --   --   --  0.33  CREATININE 1.11 1.27*  --   --   --   TROPONINIHS  --   --  6,603* 7,382*  --     Estimated Creatinine Clearance: 63.8 mL/min (A) (by C-G formula based on SCr of 1.27 mg/dL (H)).   Assessment: 70 yr old man admitted on 11/12/20 with R leg cellulitis. This afternoon, troponin markedly elevated at 6603; Cardiology was consulted. Pharmacy was consulted to dose heparin for ACS.  Heparin level therapeutic (0.33) on gtt at 1150 units/hr. No bleeding noted.  Goal of Therapy:  Heparin level 0.3-0.7 units/ml Monitor platelets by anticoagulation protocol: Yes   Plan:  Continue heparin infusion at 1150 units/hr F/u 6 hr confirmatory heparin level  Sherlon Handing, PharmD, BCPS Please see amion for complete clinical pharmacist phone list 11/15/2020,3:42 AM

## 2020-11-16 DIAGNOSIS — L03115 Cellulitis of right lower limb: Secondary | ICD-10-CM | POA: Diagnosis not present

## 2020-11-16 DIAGNOSIS — Z66 Do not resuscitate: Secondary | ICD-10-CM

## 2020-11-16 DIAGNOSIS — L0390 Cellulitis, unspecified: Secondary | ICD-10-CM | POA: Diagnosis not present

## 2020-11-16 DIAGNOSIS — Z7189 Other specified counseling: Secondary | ICD-10-CM

## 2020-11-16 DIAGNOSIS — Z515 Encounter for palliative care: Secondary | ICD-10-CM | POA: Diagnosis not present

## 2020-11-16 DIAGNOSIS — L03116 Cellulitis of left lower limb: Secondary | ICD-10-CM | POA: Diagnosis not present

## 2020-11-16 DIAGNOSIS — I10 Essential (primary) hypertension: Secondary | ICD-10-CM | POA: Diagnosis not present

## 2020-11-16 DIAGNOSIS — E1065 Type 1 diabetes mellitus with hyperglycemia: Secondary | ICD-10-CM | POA: Diagnosis not present

## 2020-11-16 LAB — COMPREHENSIVE METABOLIC PANEL
ALT: 69 U/L — ABNORMAL HIGH (ref 0–44)
AST: 32 U/L (ref 15–41)
Albumin: 2.2 g/dL — ABNORMAL LOW (ref 3.5–5.0)
Alkaline Phosphatase: 183 U/L — ABNORMAL HIGH (ref 38–126)
Anion gap: 15 (ref 5–15)
BUN: 44 mg/dL — ABNORMAL HIGH (ref 8–23)
CO2: 21 mmol/L — ABNORMAL LOW (ref 22–32)
Calcium: 8.9 mg/dL (ref 8.9–10.3)
Chloride: 99 mmol/L (ref 98–111)
Creatinine, Ser: 1.15 mg/dL (ref 0.61–1.24)
GFR, Estimated: >60 mL/min (ref >60)
Glucose, Bld: 264 mg/dL — ABNORMAL HIGH (ref 70–99)
Potassium: 5.1 mmol/L (ref 3.5–5.1)
Sodium: 135 mmol/L (ref 135–145)
Total Bilirubin: 1 mg/dL (ref 0.3–1.2)
Total Protein: 5.8 g/dL — ABNORMAL LOW (ref 6.5–8.1)

## 2020-11-16 LAB — CBC
HCT: 34.1 % — ABNORMAL LOW (ref 39.0–52.0)
Hemoglobin: 12 g/dL — ABNORMAL LOW (ref 13.0–17.0)
MCH: 33.2 pg (ref 26.0–34.0)
MCHC: 35.2 g/dL (ref 30.0–36.0)
MCV: 94.5 fL (ref 80.0–100.0)
Platelets: 362 10*3/uL (ref 150–400)
RBC: 3.61 MIL/uL — ABNORMAL LOW (ref 4.22–5.81)
RDW: 14 % (ref 11.5–15.5)
WBC: 20.3 10*3/uL — ABNORMAL HIGH (ref 4.0–10.5)
nRBC: 0 % (ref 0.0–0.2)

## 2020-11-16 LAB — GLUCOSE, CAPILLARY
Glucose-Capillary: 247 mg/dL — ABNORMAL HIGH (ref 70–99)
Glucose-Capillary: 290 mg/dL — ABNORMAL HIGH (ref 70–99)
Glucose-Capillary: 307 mg/dL — ABNORMAL HIGH (ref 70–99)
Glucose-Capillary: 212 mg/dL — ABNORMAL HIGH (ref 70–99)

## 2020-11-16 LAB — PHOSPHORUS: Phosphorus: 4.1 mg/dL (ref 2.5–4.6)

## 2020-11-16 LAB — HEPARIN LEVEL (UNFRACTIONATED)
Heparin Unfractionated: 0.25 IU/mL — ABNORMAL LOW (ref 0.30–0.70)
Heparin Unfractionated: 0.45 IU/mL (ref 0.30–0.70)

## 2020-11-16 LAB — MAGNESIUM: Magnesium: 2.3 mg/dL (ref 1.7–2.4)

## 2020-11-16 LAB — MRSA PCR SCREENING: MRSA by PCR: NEGATIVE

## 2020-11-16 MED ORDER — CARVEDILOL 12.5 MG PO TABS
12.5000 mg | ORAL_TABLET | Freq: Two times a day (BID) | ORAL | Status: DC
  Administered 2020-11-16 – 2020-11-20 (×9): 12.5 mg via ORAL
  Filled 2020-11-16 (×10): qty 1

## 2020-11-16 MED ORDER — INSULIN GLARGINE 100 UNIT/ML ~~LOC~~ SOLN
40.0000 [IU] | Freq: Two times a day (BID) | SUBCUTANEOUS | Status: DC
  Administered 2020-11-16 (×2): 40 [IU] via SUBCUTANEOUS
  Filled 2020-11-16 (×4): qty 0.4

## 2020-11-16 MED ORDER — INSULIN ASPART 100 UNIT/ML ~~LOC~~ SOLN
12.0000 [IU] | Freq: Three times a day (TID) | SUBCUTANEOUS | Status: DC
  Administered 2020-11-16 – 2020-11-17 (×4): 12 [IU] via SUBCUTANEOUS

## 2020-11-16 NOTE — Consult Note (Addendum)
Palliative Medicine Inpatient Consult Note  Reason for consult:  Goals of Care  HPI:  Per intake H&P --> Jeremy Hickman is a 70 y.o. male with medical history significant of HTN and DM presenting with RLE cellulitis.  He reports RLE edema.  He thought it was fluid but now he thinks it might need amputation.  He went to urgent care last Sunday and was scheduled for DVT US.  He was told he needed to go to the emergency room.  He went to [facility] ER and he left.  Then he went to [facility] and was there all last night without assistance (he is very upset about his experience there).  He does feel like it has gotten better with the antibiotics since then.  Denies trauma to the area.  He did have a cut between his first and second toes fairly recently but that healed quickly. No h/o cellulitis.  Denies fever.  Used a walker at first due to discomfort but currently unable to bear weight at all.  The leg has been weeping.  Clinical Assessment/Goals of Care:  *Please note that this is a verbal dictation therefore any spelling or grammatical errors are due to the "[employer]" system interpretation.  I have reviewed medical records including EPIC notes, labs and imaging, received report from bedside RN, assessed the patient who is sitting up in bed preparing to eat his lunch.    I met with Jeremy Hickman to further discuss diagnosis prognosis, GOC, EOL wishes, disposition and options.   I introduced Palliative Medicine as specialized medical care for people living with serious illness. It focuses on providing relief from the symptoms and stress of a serious illness. The goal is to improve quality of life for both the patient and the family.  Jeremy Hickman shares with me that he is from [facility].  He is married and he and his wife Jeremy Hickman have been wed for the past 37 years.  They never had children.  They do share a beagle mix who is 71 years old and identified as "the love of my life".  Jeremy Hickman is  identified as "always on the go".  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  Prior to hospitalization [name]'s shares with me that he was independent of all basic activities of daily living and instrumental activities of daily living.  I asked [name] to tell me about his current medical conditions. He expresses that he has Diabetes which he has lived with for many years now. We discussed his coronary artery disease and heart failure. We reviewed that these are all chronic medical conditions. Reviewed that heart failure is a progressive disease which requires stringent management when at home.   [name] shares that he may receive a cardiac catheterization tomorrow. We discussed his feelings about this and he shares that if it makes him feel better he is in favor of it.  A detailed discussion was had today regarding advanced directives - patient shares that he has a living will, will request that a copy is brought in from his wife.    Concepts specific to code status, artifical feeding and hydration, continued IV antibiotics and rehospitalization was had.  We reviewed a MOST form together and completed one as below:  Cardiopulmonary Resuscitation: Do Not Attempt Resuscitation (DNR/No CPR)  Medical Interventions: Limited Additional Interventions: Use medical treatment, IV fluids and cardiac monitoring as indicated, DO NOT USE intubation or mechanical ventilation. May consider use of less invasive airway support such as BiPAP or CPAP. Also provide comfort measures. Transfer to the hospital if indicated. Avoid intensive care.   Antibiotics: Antibiotics if indicated  IV Fluids: IV fluids if indicated  Feeding Tube: Feeding tube for a defined trial period   Discussed the importance of continued conversation with family and their  medical providers regarding overall  plan of care and treatment options, ensuring decisions are within the context of the patients values and GOCs. ________________________________________ Addendum:  I called patient wife, Jeremy Hickman to corroborate the information provided we reviewed his present medical conditions. She shares that he was fully independent prior to hospitalization.  Decision Maker: Patient can make decisions for himself. If he is unable to make decisions he would rely on his wife, Jeremy Hickman [phone] to make decisions for him  SUMMARY OF RECOMMENDATIONS   DNAR/DNI  MOST Completed, paper copy placed onto the chart electric copy can be found in [facility]  DNR Form Completed, paper copy placed onto the chart electric copy can be found in [facility]  Spiritual Support  Muscular Weakness - PT Eval  TOC - OP Palliative Referral --> Patient and wife would benefit from ongoing education/support regarding chronic diseases  Ongoing incremental PMT support, please call the service if additional help is needed sooner  Code Status/Advance Care Planning: DNAR/DNI  Palliative Prophylaxis:   Oral Care, Mobility  Additional Recommendations (Limitations, Scope, Preferences):  Continue to treat what is treatable   Psycho-social/Spiritual:   Desire for further Chaplaincy support: Yes  Additional Recommendations: Education on chronic diseases   Prognosis: Unclear, (+) multiple comorbidities but had a functional baseline  Discharge Planning: Discharge likely to home with OP Palliative support  Vitals:   11/16/20 0825 11/16/20 1139  BP: (!) 149/91 129/74  Pulse: 83 73  Resp: 16 18  Temp: 97.7 F (36.5 C) (!) 97.3 F (36.3 C)  SpO2: 98% 96%    Intake/Output Summary (Last 24 hours) at 11/16/2020 1255 Last data filed at 11/16/2020 0745 Gross per 24 hour  Intake 1081.85 ml  Output 2400 ml  Net -1318.15 ml   Last Weight  Most recent update: 11/13/2020  3:27 PM   Weight  108.7 kg (239 lb 10.2 oz)            Gen:  NAD HEENT: moist mucous membranes CV: Regular rate and rhythm  PULM: Gets winded when speaking complete sentences, clear to auscultation bilaterally  ABD: soft/nontender  EXT: (+)trace BLE edema, RLE red Neuro: Alert and oriented x4  PPS: 50%   This conversation/these recommendations were discussed with patient primary care team, Dr. Cyndia Skeeters  Time In: 1200 Time Out: 1310 Total Time: 70 Greater than 50%  of this time was spent counseling and coordinating care related to the above assessment and plan.  [place] Team Team Cell Phone: [phone] Please utilize secure chat with additional questions, if there is no response within 30 minutes please call the above phone number  Palliative Medicine Team providers are available by phone from 7am to 7pm daily and can be reached through the team cell phone.  Should this patient require assistance outside of these hours, please call the patient's attending physician.

## 2020-11-16 NOTE — Progress Notes (Signed)
Progress Note  Patient Name: Jeremy Hickman Date of Encounter: 11/16/2020  Primary Cardiologist: No primary care provider on file.   Subjective   Tired but feel cellulitis is improving.  Inpatient Medications    Scheduled Meds: . aspirin EC  81 mg Oral Daily  . atorvastatin  80 mg Oral Daily  . busPIRone  15 mg Oral QPM  . busPIRone  30 mg Oral Q0600  . carvedilol  6.25 mg Oral BID WC  . docusate sodium  100 mg Oral BID  . donepezil  5 mg Oral QHS  . ezetimibe  10 mg Oral Daily  . FLUoxetine  20 mg Oral Daily  . folic acid  1 mg Oral Daily  . guaiFENesin  600 mg Oral BID  . hydrocerin   Topical Daily  . insulin aspart  0-20 Units Subcutaneous TID WC  . insulin aspart  0-5 Units Subcutaneous QHS  . insulin aspart  10 Units Subcutaneous TID WC  . insulin glargine  40 Units Subcutaneous BID  . loratadine  10 mg Oral Daily  . melatonin  3 mg Oral QHS  . metoCLOPramide (REGLAN) injection  5 mg Intravenous Q8H  . multivitamin with minerals  1 tablet Oral Daily  . nutrition supplement (JUVEN)  1 packet Oral BID BM  . pantoprazole  40 mg Oral Daily  . Ensure Max Protein  11 oz Oral QHS  . sodium chloride flush  3 mL Intravenous Q12H  . terazosin  5 mg Oral QHS  . thiamine  100 mg Oral Daily   Or  . thiamine  100 mg Intravenous Daily   Continuous Infusions: . sodium chloride 10 mL/hr at 11/14/20 0151  . ampicillin-sulbactam (UNASYN) IV 3 g (11/16/20 0804)  . heparin 1,900 Units/hr (11/16/20 0800)  . vancomycin 1,500 mg (11/15/20 1853)   PRN Meds: acetaminophen **OR** acetaminophen, alum & mag hydroxide-simeth, bisacodyl, diphenhydrAMINE, hydrALAZINE, HYDROcodone-acetaminophen, ipratropium-albuterol, LORazepam **OR** LORazepam, morphine injection, nitroGLYCERIN, ondansetron **OR** ondansetron (ZOFRAN) IV, polyethylene glycol, temazepam   Vital Signs    Vitals:   11/16/20 0003 11/16/20 0358 11/16/20 0422 11/16/20 0825  BP: 129/68  (!) 141/87 (!) 149/91  Pulse: 72 84  79 83  Resp: 15 16 20 16   Temp: 97.9 F (36.6 C)  98.9 F (37.2 C) 97.7 F (36.5 C)  TempSrc: Oral  Axillary Oral  SpO2: 99% 100% 100% 98%  Weight:      Height:        Intake/Output Summary (Last 24 hours) at 11/16/2020 0843 Last data filed at 11/16/2020 0745 Gross per 24 hour  Intake 1126.91 ml  Output 2400 ml  Net -1273.09 ml   Filed Weights   11/12/20 2316 11/13/20 1526  Weight: 109.8 kg 108.7 kg    Telemetry    SR- Personally Reviewed  ECG    SR, RBBB QRS 136 ms, anteroseptal and inferior infarct pattern, T wave abnormality laterally  - Personally Reviewed  Physical Exam   GEN: No acute distress.   Neck: No JVD Cardiac: regular rhythm, normal rate, no murmurs, rubs, or gallops.  Respiratory: Clear to auscultation bilaterally. GI: Soft, nontender, non-distended  MS:  Trace bilateral edema; RLE is red and warm but improving Neuro:  Nonfocal  Psych: Normal affect   Labs    Chemistry Recent Labs  Lab 11/12/20 2144 11/14/20 0448 11/15/20 0253 11/16/20 0608  NA 126* 128* 130* 135  K 4.2 5.1 5.0 5.1  CL 93* 92* 95* 99  CO2 23 20* 21*  21*  GLUCOSE 139* 304* 324* 264*  BUN 30* 24* 28* 44*  CREATININE 1.11 1.27* 1.22 1.15  CALCIUM 8.7* 8.8* 8.7* 8.9  PROT 6.7  --  5.9* 5.8*  ALBUMIN 3.2*  --  2.2* 2.2*  AST 77*  --  54* 32  ALT 82*  --  65* 69*  ALKPHOS 223*  --  244* 183*  BILITOT 0.3  --  1.0 1.0  GFRNONAA >60 >60 >60 >60  ANIONGAP 10 16* 14 15     Hematology Recent Labs  Lab 11/14/20 0448 11/15/20 0253 11/16/20 0608  WBC 20.5* 23.0* 20.3*  RBC 3.59* 3.81* 3.61*  HGB 11.7* 12.6* 12.0*  HCT 34.4* 35.6* 34.1*  MCV 95.8 93.4 94.5  MCH 32.6 33.1 33.2  MCHC 34.0 35.4 35.2  RDW 13.6 13.8 14.0  PLT 271 368 362    Cardiac EnzymesNo results for input(s): TROPONINI in the last 168 hours. No results for input(s): TROPIPOC in the last 168 hours.   BNP Recent Labs  Lab 11/14/20 0448  BNP 859.3*     DDimer No results for input(s): DDIMER in  the last 168 hours.   Radiology    DG CHEST PORT 1 VIEW  Result Date: 11/15/2020 CLINICAL DATA:  Shortness of breath EXAM: PORTABLE CHEST 1 VIEW COMPARISON:  11/14/2020 FINDINGS: Cardiomegaly, vascular congestion. Patchy opacity in the left upper lobe is similar to prior study, new since 11/12/2020 concerning for infiltrate/pneumonia. Bibasilar atelectasis or infiltrates. No effusions or acute bony abnormality. IMPRESSION: Cardiomegaly, vascular congestion. Patchy left upper lobe and bibasilar opacities could reflect infiltrate/pneumonia. Electronically Signed   By: Rolm Baptise M.D.   On: 11/15/2020 21:00   DG Abd Portable 1V  Result Date: 11/15/2020 CLINICAL DATA:  Nausea, vomiting. EXAM: PORTABLE ABDOMEN - 1 VIEW COMPARISON:  None. FINDINGS: Mild gastric distention is noted. No evidence of large or small bowel dilatation is noted. No radio-opaque calculi or other significant radiographic abnormality are seen. IMPRESSION: Mild gastric distention is noted. No definite evidence of bowel obstruction or ileus. Electronically Signed   By: Marijo Conception M.D.   On: 11/15/2020 14:56   ECHOCARDIOGRAM COMPLETE  Result Date: 11/14/2020    ECHOCARDIOGRAM REPORT   Patient Name:   Jeremy Hickman Date of Exam: 11/14/2020 Medical Rec #:  [id number]       Height:       68.0 in Accession #:    [id number]      Weight:       239.6 lb Date of Birth:  04-25-1951       BSA:          2.207 m Patient Age:    70 years        BP:           163/89 mmHg Patient Gender: M               HR:           91 bpm. Exam Location:  Inpatient Procedure: 2D Echo, Color Doppler and Cardiac Doppler Indications:    Dyspnea R06.00  History:        Patient has no prior history of Echocardiogram examinations.                 Risk Factors:Hypertension and Diabetes. Cellulitis.  Sonographer:    Darlina Sicilian RDCS Referring Phys: [id number] Charlesetta Ivory GONFA IMPRESSIONS  1. Left ventricular ejection fraction, by estimation, is 40 to 45%. The left ventricle  has mildly  decreased function. The left ventricle demonstrates regional wall motion abnormalities (see scoring diagram/findings for description). There is mild left ventricular hypertrophy. Left ventricular diastolic parameters are consistent with Grade I diastolic dysfunction (impaired relaxation). Elevated left atrial pressure.  2. Right ventricular systolic function is normal. The right ventricular size is normal.  3. The mitral valve is normal in structure. No evidence of mitral valve regurgitation. No evidence of mitral stenosis.  4. The aortic valve is normal in structure. Aortic valve regurgitation is not visualized. Mild to moderate aortic valve sclerosis/calcification is present, without any evidence of aortic stenosis.  5. The inferior vena cava is normal in size with greater than 50% respiratory variability, suggesting right atrial pressure of 3 mmHg. FINDINGS  Left Ventricle: Left ventricular ejection fraction, by estimation, is 40 to 45%. The left ventricle has mildly decreased function. The left ventricle demonstrates regional wall motion abnormalities. The left ventricular internal cavity size was normal in size. There is mild left ventricular hypertrophy. Left ventricular diastolic parameters are consistent with Grade I diastolic dysfunction (impaired relaxation). Elevated left atrial pressure.  LV Wall Scoring: The inferior wall and posterior wall are akinetic. Right Ventricle: The right ventricular size is normal. No increase in right ventricular wall thickness. Right ventricular systolic function is normal. Left Atrium: Left atrial size was normal in size. Right Atrium: Right atrial size was normal in size. Pericardium: There is no evidence of pericardial effusion. Mitral Valve: The mitral valve is normal in structure. No evidence of mitral valve regurgitation. No evidence of mitral valve stenosis. Tricuspid Valve: The tricuspid valve is normal in structure. Tricuspid valve regurgitation is not  demonstrated. No evidence of tricuspid stenosis. Aortic Valve: The aortic valve is normal in structure. Aortic valve regurgitation is not visualized. Mild to moderate aortic valve sclerosis/calcification is present, without any evidence of aortic stenosis. Pulmonic Valve: The pulmonic valve was normal in structure. Pulmonic valve regurgitation is not visualized. No evidence of pulmonic stenosis. Aorta: The aortic root is normal in size and structure. Venous: The inferior vena cava is normal in size with greater than 50% respiratory variability, suggesting right atrial pressure of 3 mmHg. IAS/Shunts: No atrial level shunt detected by color flow Doppler.  LEFT VENTRICLE PLAX 2D LVIDd:         4.10 cm  Diastology LVIDs:         3.40 cm  LV e' medial:    3.92 cm/s LV PW:         1.20 cm  LV E/e' medial:  25.0 LV IVS:        1.40 cm  LV e' lateral:   5.00 cm/s LVOT diam:     2.10 cm  LV E/e' lateral: 19.6 LV SV:         63 LV SV Index:   29 LVOT Area:     3.46 cm  RIGHT VENTRICLE RV S prime:     14.50 cm/s TAPSE (M-mode): 1.6 cm LEFT ATRIUM             Index LA diam:        3.00 cm 1.36 cm/m LA Vol (A2C):   49.0 ml 22.20 ml/m LA Vol (A4C):   55.3 ml 25.06 ml/m LA Biplane Vol: 55.5 ml 25.15 ml/m  AORTIC VALVE LVOT Vmax:   96.80 cm/s LVOT Vmean:  66.000 cm/s LVOT VTI:    0.183 m  AORTA Ao Root diam: 3.00 cm MITRAL VALVE MV Area (PHT): 6.32 cm     SHUNTS  MV Decel Time: 120 msec     Systemic VTI:  0.18 m MV E velocity: 98.00 cm/s   Systemic Diam: 2.10 cm MV A velocity: 108.00 cm/s MV E/A ratio:  0.91 Candee Furbish MD Electronically signed by Candee Furbish MD Signature Date/Time: 11/14/2020/2:29:24 PM    Final     Cardiac Studies   Echo   1. Left ventricular ejection fraction, by estimation, is 40 to 45%. The  left ventricle has mildly decreased function. The left ventricle  demonstrates regional wall motion abnormalities (see scoring  diagram/findings for description). There is mild left  ventricular hypertrophy.  Left ventricular diastolic parameters are  consistent with Grade I diastolic dysfunction (impaired relaxation).  Elevated left atrial pressure.   2. Right ventricular systolic function is normal. The right ventricular  size is normal.   3. The mitral valve is normal in structure. No evidence of mitral valve  regurgitation. No evidence of mitral stenosis.   4. The aortic valve is normal in structure. Aortic valve regurgitation is  not visualized. Mild to moderate aortic valve sclerosis/calcification is  present, without any evidence of aortic stenosis.   5. The inferior vena cava is normal in size with greater than 50%  respiratory variability, suggesting right atrial pressure of 3 mmHg.      Patient Profile     OBRYANT BOREY is a 70 y.o. 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 Cyndia Skeeters.   Assessment & Plan   Principal Problem:   Cellulitis Active Problems:   Cognitive complaints   Essential hypertension   Hyperlipidemia   Class 2 obesity due to excess calories with body mass index (BMI) of 36.0 to 36.9 in adult   Cellulitis of right lower limb   NSTEMI   - High-sensitivity troponin 6,603 >> 7382 - Echo showed LVEF of 40-45% with akinesis of inferior wall and posterior wall as well as grade 1 diastolic function. EF down from 50-55% from last Echo in 2020 at [facility] (did have hypokinesis of inferolateral, inferoseptal, and inferior walls at that time). - IV Heparin - Aspirin 81mg  daily.  -  increase coreg to 12.5 mg Bid for elevated BP  -  Lipitor 80mg  daily and continue Zetia which he takes at home. Was on Pravastatin at home. - Possible cardiac catheterization on Monday or Tuesday depending on improvement in cellulitis. Improving. BCx NGTD.    Acute on Chronic Combined CHF - BNP elevated in the 800's. - IV lasix 40 mg BID today. Reassess daily. No I/O, unable to assess UOP for response. Clinically, will monitor for improvement. - increase coreg as above - Home Telmisartan was held on admission due to "borderline renal function." OK to continue to hold for now to allow for diuresis but would restart ARB prior to discharge given reduced EF. - Monitor daily weight, strict I/O's, and renal function.   Hypertension - BP elevated - increase Coreg as above - Home Telmisartan and Spironolactone currently on hold due to increase in creatinine.    Hyperlipidemia - On Pravastatin and Zetia at home. - Will start Lipitor 80mg  daily here and continue Zetia.   Diabetes Mellitus - Management per primary team.   Right Lower Extremity Cellulitis  - Continue antibiotics per primary team.  For questions or updates, please contact [place] Please consult [email or web address] for contact info under        Signed, Elouise Munroe, MD  11/16/2020, 8:43 AM

## 2020-11-16 NOTE — Progress Notes (Signed)
[place] for IV Heparin Indication: chest pain/ACS  Allergies  Allergen Reactions  . Iodinated Diagnostic Agents Hives    Patient Measurements: Height: 5\' 8"  (172.7 cm) Weight: 108.7 kg (239 lb 10.2 oz) IBW/kg (Calculated) : 68.4 Heparin Dosing Weight: 92.5 kg  Vital Signs: Temp: 97.3 F (36.3 C) (02/06 1139) Temp Source: Oral (02/06 1139) BP: 129/74 (02/06 1139) Pulse Rate: 81 (02/06 1559)  Labs: Recent Labs    11/14/20 0448 11/14/20 1656 11/14/20 1916 11/15/20 0253 11/15/20 0802 11/15/20 1037 11/15/20 2013 11/16/20 0608 11/16/20 1504  HGB 11.7*  --   --  12.6*  --   --   --  12.0*  --   HCT 34.4*  --   --  35.6*  --   --   --  34.1*  --   PLT 271  --   --  368  --   --   --  362  --   HEPARINUNFRC  --   --   --  0.33  --    < > 0.10* 0.25* 0.45  CREATININE 1.27*  --   --  1.22  --   --   --  1.15  --   TROPONINIHS  --  6,603* 7,382*  --  4,065*  --   --   --   --    < > = values in this interval not displayed.    Estimated Creatinine Clearance: 70.4 mL/min (by C-G formula based on SCr of 1.15 mg/dL).   Assessment: 70 yr old man admitted on 11/12/20 with R leg cellulitis. This afternoon, troponin markedly elevated at 6603; Cardiology was consulted. Pharmacy was consulted to dose heparin for ACS.  Heparin level therapeutic s/p rate increase to 1900 units/hr  Goal of Therapy:  Heparin level 0.3-0.7 units/ml Monitor platelets by anticoagulation protocol: Yes   Plan:  Continue heparin gtt at 1900 units/hr Daily heparin level, CBC, s/s bleeding F/u cards plan for cath early next week  Bertis Ruddy, PharmD Clinical Pharmacist ED Pharmacist Phone # [phone] 11/16/2020 4:21 PM

## 2020-11-16 NOTE — Evaluation (Signed)
Physical Therapy Evaluation Patient Details Name: Jeremy Hickman MRN: [id number] DOB: September 27, 1951 Today's Date: 11/16/2020   History of Present Illness  Patient is a 70 y/o male who presents with RLE redness, swelling and weeping. Found to have RLE cellulitis, failed outpatient antibiotics. Found to have NSTEMI. PLan for cardiac cath 11/17/20. PMH includes OSA on CPAP, HTN, DM-2, morbid obesity, anxiety and dementia.  Clinical Impression  Patient presents with generalized weakness, redness/swelling RLE, impaired balance, cognitive deficits and impaired mobility s/p above. Pt lives at home with wife (who uses a w/c for mobility) and reports being independent for ADLs/IADLs and driving PTA. Reports no falls. Today, pt requires Min A for transfers and gait training, holding onto IV pole and furniture for support. Would benefit from use of RW for safety. Able to place weight through RLE today. Eager to return home. Will follow acutely to maximize independence and mobility prior to return home.    Follow Up Recommendations Home health PT;Supervision for mobility/OOB    Equipment Recommendations  Rolling walker with 5" wheels    Recommendations for Other Services OT consult     Precautions / Restrictions Precautions Precautions: Fall Restrictions Weight Bearing Restrictions: No      Mobility  Bed Mobility Overal bed mobility: Needs Assistance Bed Mobility: Supine to Sit     Supine to sit: Min guard;HOB elevated     General bed mobility comments: Increased time/effort and use of rail to get to EOB.    Transfers Overall transfer level: Needs assistance Equipment used: 1 person hand held assist Transfers: Sit to/from Stand Sit to Stand: Min assist         General transfer comment: Assist to power and steady in standing with pt holding onto bed for support. placing arm around therapist for balance joking, "are we going to dance?"  Ambulation/Gait Ambulation/Gait assistance: Min  assist Gait Distance (Feet): 20 Feet Assistive device: IV Pole Gait Pattern/deviations: Step-to pattern;Shuffle;Wide base of support;Step-through pattern;Decreased stride length;Decreased stance time - right Gait velocity: decreased Gait velocity interpretation: <1.8 ft/sec, indicate of risk for recurrent falls General Gait Details: Slow, mildly unsteady gait with decreased stance time RLE due to pain. Furniture walking and holding onto IV pole for support. Would benefit from use of RW.  Stairs            Wheelchair Mobility    Modified Rankin (Stroke Patients Only)       Balance Overall balance assessment: Needs assistance Sitting-balance support: Feet supported;No upper extremity supported Sitting balance-Leahy Scale: Good     Standing balance support: During functional activity Standing balance-Leahy Scale: Poor Standing balance comment: Requires UE support in standing.                             Pertinent Vitals/Pain Pain Assessment: No/denies pain    Home Living Family/patient expects to be discharged to:: Private residence Living Arrangements: Spouse/significant other Available Help at Discharge: Family;Available 24 hours/day Type of Home: House Home Access: Level entry     Home Layout: One level Home Equipment: Cane - quad;Shower seat;Wheelchair - [employer] - 2 wheels      Prior Function Level of Independence: Independent         Comments: Independent for ADLs/IADLs. Drives. No falls in last 6 months. Reports wife is mainly in a w/c.     Hand Dominance   Dominant Hand: Right    Extremity/Trunk Assessment   Upper Extremity Assessment Upper  Extremity Assessment: Defer to OT evaluation    Lower Extremity Assessment Lower Extremity Assessment: Generalized weakness;RLE deficits/detail RLE Deficits / Details: Erythema and swelling present       Communication   Communication: No difficulties  Cognition Arousal/Alertness:  Awake/alert Behavior During Therapy: WFL for tasks assessed/performed Overall Cognitive Status: No family/caregiver present to determine baseline cognitive functioning                                 General Comments: Follows commands well. Seems to mask deficits with humor. Stating he is going home tomorrow despite scheduled for a cardiac cath.      General Comments General comments (skin integrity, edema, etc.): Swelling and redness present RLE- pt reports this has improved. Visitor present upon PT departure.    Exercises     Assessment/Plan    PT Assessment Patient needs continued PT services  PT Problem List Decreased strength;Decreased mobility;Decreased safety awareness;Decreased balance;Decreased knowledge of use of DME;Decreased activity tolerance;Decreased skin integrity       PT Treatment Interventions Therapeutic exercise;Gait training;Therapeutic activities;Patient/family education;Cognitive remediation;Neuromuscular re-education;Balance training;DME instruction    PT Goals (Current goals can be found in the Care Plan section)  Acute Rehab PT Goals Patient Stated Goal: to go home tomorrow PT Goal Formulation: With patient Time For Goal Achievement: 11/30/20 Potential to Achieve Goals: Good    Frequency Min 3X/week   Barriers to discharge Decreased caregiver support      Co-evaluation               AM-PAC PT "6 Clicks" Mobility  Outcome Measure Help needed turning from your back to your side while in a flat bed without using bedrails?: A Little Help needed moving from lying on your back to sitting on the side of a flat bed without using bedrails?: A Little Help needed moving to and from a bed to a chair (including a wheelchair)?: A Little Help needed standing up from a chair using your arms (e.g., wheelchair or bedside chair)?: A Little Help needed to walk in hospital room?: A Little Help needed climbing 3-5 steps with a railing? : A Lot 6  Click Score: 17    End of Session Equipment Utilized During Treatment: Gait belt Activity Tolerance: Patient limited by fatigue;Patient tolerated treatment well Patient left: in bed;with call bell/phone within reach;with family/visitor present (sitting EOB with visitor present, pt aware he needs to ask for help to returnt o bed) Nurse Communication: Mobility status PT Visit Diagnosis: Unsteadiness on feet (R26.81);Muscle weakness (generalized) (M62.81);Other abnormalities of gait and mobility (R26.89)    Time: 0086-7619 PT Time Calculation (min) (ACUTE ONLY): 24 min   Charges:   PT Evaluation $PT Eval Moderate Complexity: 1 Mod PT Treatments $Therapeutic Activity: 8-22 mins        Marisa Severin, PT, DPT Acute Rehabilitation Services Pager [phone] Office [place] 11/16/2020, 2:49 PM

## 2020-11-16 NOTE — Progress Notes (Signed)
[place] for IV Heparin Indication: chest pain/ACS  Allergies  Allergen Reactions  . Iodinated Diagnostic Agents Hives    Patient Measurements: Height: 5\' 8"  (172.7 cm) Weight: 108.7 kg (239 lb 10.2 oz) IBW/kg (Calculated) : 68.4 Heparin Dosing Weight: 92.5 kg  Vital Signs: Temp: 98.9 F (37.2 C) (02/06 0422) Temp Source: Axillary (02/06 0422) BP: 141/87 (02/06 0422) Pulse Rate: 79 (02/06 0422)  Labs: Recent Labs    11/14/20 0448 11/14/20 0448 11/14/20 1656 11/14/20 1916 11/15/20 0253 11/15/20 0802 11/15/20 1037 11/15/20 2013 11/16/20 0608  HGB 11.7*  --   --   --  12.6*  --   --   --  12.0*  HCT 34.4*  --   --   --  35.6*  --   --   --  34.1*  PLT 271  --   --   --  368  --   --   --  362  HEPARINUNFRC  --    < >  --   --  0.33  --  0.14* 0.10* 0.25*  CREATININE 1.27*  --   --   --  1.22  --   --   --  1.15  TROPONINIHS  --   --  6,603* 3,267*  --  4,065*  --   --   --    < > = values in this interval not displayed.    Estimated Creatinine Clearance: 70.4 mL/min (by C-G formula based on SCr of 1.15 mg/dL).   Assessment: 70 yr old man admitted on 11/12/20 with R leg cellulitis. This afternoon, troponin markedly elevated at 6603; Cardiology was consulted. Pharmacy was consulted to dose heparin for ACS.  Heparin level this morning is low at 0.25 on gtt at 1650 units/hr. No bleeding noted.   Goal of Therapy:  Heparin level 0.3-0.7 units/ml Monitor platelets by anticoagulation protocol: Yes   Plan:  Increase heparin infusion to 1900 units/hr F/u 8 hour heparin level  Shakai Dolley A. Levada Dy, PharmD, BCPS, FNKF Clinical Pharmacist [place] Please utilize Amion for appropriate phone number to reach the unit pharmacist ([place])   11/16/2020 7:25 AM

## 2020-11-16 NOTE — Progress Notes (Signed)
PROGRESS NOTE  Jeremy Hickman [id number] DOB: 1951/01/20   PCP: Malachi Pro [name], PA-C  Patient is from: Home. Independently ambulates at baseline.  DOA: 11/12/2020 LOS: 3  Chief complaints: Right leg swelling, redness and weeping  Brief Narrative / Interim history: 70 year old male with PMH of OSA on CPAP, HTN, DM-2, morbid obesity, anxiety and dementia presented to [facility] ED with RLE swelling, redness, weeping, pain and difficulty bearing weight, and admitted for cellulitis after he failed outpatient treatment with p.o. clindamycin. His LE [place] was negative for DVT on 11/11/2020 at [facility].  Initially started on IV ceftriaxone but antibiotics cannulated to cefepime and vancomycin.  The next day, patient developed respiratory distress. CXR without acute finding. BNP elevated to 800.  Respiratory distress improved after brief BiPAP, IV Lasix, DuoNeb and anxiolytics.  TTE showed LVEF of 40 to 45%, G1 DD and RWMA.  Troponin elevated to 6600.  Started on heparin drip and cardiology consulted.   Subjective: Seen and examined earlier this morning.  He had an episode of desaturation and respiratory distress overnight.  Breathing improved with BiPAP, additional dose of IV Lasix, Solu-Medrol.  He feels well this morning.  Denies shortness of breath or chest pain.  Edema improved.  Denies nausea, vomiting or abdominal pain.  Objective: Vitals:   11/16/20 0358 11/16/20 0422 11/16/20 0825 11/16/20 1139  BP:  (!) 141/87 (!) 149/91 129/74  Pulse: 84 79 83 73  Resp: 16 20 16 18   Temp:  98.9 F (37.2 C) 97.7 F (36.5 C) (!) 97.3 F (36.3 C)  TempSrc:  Axillary Oral Oral  SpO2: 100% 100% 98% 96%  Weight:      Height:        Intake/Output Summary (Last 24 hours) at 11/16/2020 1217 Last data filed at 11/16/2020 0745 Gross per 24 hour  Intake 1081.85 ml  Output 2400 ml  Net -1318.15 ml   Filed Weights   11/12/20 2316 11/13/20 1526  Weight: 109.8 kg 108.7 kg    Examination:  GENERAL: No  apparent distress.  Nontoxic. HEENT: MMM.  Vision and hearing grossly intact.  NECK: Supple.  Difficult to assess JVD due to body habitus. RESP: 97% on 2 L.  No IWOB.  Good aeration bilaterally.  No crackles. CVS:  RRR. Heart sounds normal.  ABD/GI/GU: BS+. Abd soft, NTND.  MSK/EXT:  Moves extremities. No apparent deformity.  Trace edema SKIN: RLE erythema below his knee.  No increased warmth to touch. NEURO: Awake, alert and oriented appropriately.  No apparent focal neuro deficit. PSYCH: Calm. Normal affect.  Procedures:  None  Microbiology summarized: COVID-19 PCR nonreactive. Blood cultures NGTD.  Assessment & Plan: Non-STEMI in patient with remote history of RCA MI: No chest pain but respiratory distress, diaphoresis and emesis.  Twelve-lead EKG reviewed by me shows sinus rhythm with RBBB and PVC unchanged from his old EKG.  Troponin peaked at 7400 and trended down to 4000. TC 198.  HDL 11.  TG 433.  TTE as above.  Patient without chest pain. -Continue Coreg, IV heparin and high intensity statin. -As needed nitroglycerin -Possible LHC on Monday per cardiology  Acute on chronic combined CHF: TTE as above. BNP 860 (no prior to compare to).  No significant pulmonary edema on CXR.  Has some RLE swelling although in the setting of cellulitis and chronic venous insufficiency.  Started on IV Lasix.  About 2.4 L UOP/24 hours.  Renal function improved. -Defer diuretics to cardiology -GDMT-on Coreg.  Could this be causing bronchospasm? -  Home ARB on hold.  -Closely monitor fluid status, renal functions and electrolytes  Sepsis due to RLE Cellulitis: POA. Was tachycardic with leukocytosis on admission.  Cellulitis improving. -Progressively worsened despite p.o. clindamycin outpatient. -RLE [place] negative for DVT on 2/1 per care everywhere. -Change cefepime to Unasyn for anaerobic coverage -Continue vancomycin pending MRSA PCR -Diabetic control  Essential hypertension:  Normotensive. -Coreg, Terazosin and Lasix as above -Home Micardis on hold   Controlled IDDM-1 with hyperglycemia and hyperlipidemia: A1c 6.7%.  Received Solu-Medrol last night. Recent Labs  Lab 11/15/20 1113 11/15/20 1709 11/15/20 2056 11/16/20 0640 11/16/20 1146  GLUCAP 320* 265* 219* 247* 290*  -Continue SSI-resistant -Increase NovoLog from 10 to 12 units AC -Increased Lantus from 30 to 40 units twice daily -High intensity statin as above -Further adjustment as appropriate  Elevated liver enzymes: Could be due to non-STEMI/sepsis.  Improving. -Continue monitoring  Hyponatremia: Resolved. -Continue monitoring  Dementia without behavioral disturbance: Fairly oriented. -Continue Buspar, Prozac, Restoril, Melatonin, trazodone -Continue Aricept  Anxiety/insomnia: Seems stable now. -Continue home BuSpar, Prozac, trazodone, Restoril and melatonin -Continue as needed Ativan  Alcohol use: Reports drinking 2 beers a day. -Continue CIWA protocol with as needed Ativan  OSA on CPAP -Continue nightly CPAP  Morbid obesity -Weight loss should be encouraged -Outpatient PCP/bariatric medicine f/u encouraged Body mass index is 36.44 kg/m. Nutrition Problem: Increased nutrient needs Etiology: acute illness Signs/Symptoms: estimated needs Interventions: MVI,Juven,Premier Protein   DVT prophylaxis:  Subcu Lovenox  Code Status: DNR/DNI Family Communication: Updated patient's wife over the phone on 2/4.  Did not pick up the phone on 2/5 & 6 Level of care: Progressive Status is: Inpatient  Remains inpatient appropriate because:Hemodynamically unstable, Ongoing diagnostic testing needed not appropriate for outpatient work up, Unsafe d/c plan, IV treatments appropriate due to intensity of illness or inability to take PO and Inpatient level of care appropriate due to severity of illness   Dispo: The patient is from: Home              Anticipated d/c is to: Home               Anticipated d/c date is: 2 days              Patient currently is not medically stable to d/c.   Difficult to place patient No       Consultants:  Cardiology   Sch Meds:  Scheduled Meds: . aspirin EC  81 mg Oral Daily  . atorvastatin  80 mg Oral Daily  . busPIRone  15 mg Oral QPM  . busPIRone  30 mg Oral Q0600  . carvedilol  12.5 mg Oral BID WC  . docusate sodium  100 mg Oral BID  . donepezil  5 mg Oral QHS  . ezetimibe  10 mg Oral Daily  . FLUoxetine  20 mg Oral Daily  . folic acid  1 mg Oral Daily  . guaiFENesin  600 mg Oral BID  . hydrocerin   Topical Daily  . insulin aspart  0-20 Units Subcutaneous TID WC  . insulin aspart  0-5 Units Subcutaneous QHS  . insulin aspart  10 Units Subcutaneous TID WC  . insulin glargine  40 Units Subcutaneous BID  . loratadine  10 mg Oral Daily  . melatonin  3 mg Oral QHS  . metoCLOPramide (REGLAN) injection  5 mg Intravenous Q8H  . multivitamin with minerals  1 tablet Oral Daily  . nutrition supplement (JUVEN)  1 packet Oral BID BM  .  pantoprazole  40 mg Oral Daily  . Ensure Max Protein  11 oz Oral QHS  . sodium chloride flush  3 mL Intravenous Q12H  . terazosin  5 mg Oral QHS  . thiamine  100 mg Oral Daily   Or  . thiamine  100 mg Intravenous Daily   Continuous Infusions: . sodium chloride 10 mL/hr at 11/14/20 0151  . ampicillin-sulbactam (UNASYN) IV 3 g (11/16/20 0804)  . heparin 1,900 Units/hr (11/16/20 0800)  . vancomycin 1,500 mg (11/15/20 1853)   PRN Meds:.acetaminophen **OR** acetaminophen, alum & mag hydroxide-simeth, bisacodyl, diphenhydrAMINE, hydrALAZINE, HYDROcodone-acetaminophen, ipratropium-albuterol, LORazepam **OR** LORazepam, morphine injection, nitroGLYCERIN, ondansetron **OR** ondansetron (ZOFRAN) IV, polyethylene glycol, temazepam  Antimicrobials: Anti-infectives (From admission, onward)   Start     Dose/Rate Route Frequency Ordered Stop   11/15/20 0800  Ampicillin-Sulbactam (UNASYN) 3 g in sodium chloride  0.9 % 100 mL IVPB        3 g 200 mL/hr over 30 Minutes Intravenous Every 6 hours 11/15/20 0726     11/14/20 1630  vancomycin (VANCOREADY) IVPB 1500 mg/300 mL        1,500 mg 150 mL/hr over 120 Minutes Intravenous Every 24 hours 11/13/20 1542     11/13/20 2300  cefTRIAXone (ROCEPHIN) 1 g in sodium chloride 0.9 % 100 mL IVPB  Status:  Discontinued        1 g 200 mL/hr over 30 Minutes Intravenous Every 24 hours 11/13/20 1158 11/13/20 1510   11/13/20 1600  vancomycin (VANCOCIN) 2,250 mg in sodium chloride 0.9 % 500 mL IVPB        2,250 mg 250 mL/hr over 120 Minutes Intravenous  Once 11/13/20 1510 11/13/20 1855   11/13/20 1600  ceFEPIme (MAXIPIME) 2 g in sodium chloride 0.9 % 100 mL IVPB  Status:  Discontinued        2 g 200 mL/hr over 30 Minutes Intravenous  Once 11/13/20 1510 11/13/20 1542   11/13/20 1600  ceFEPIme (MAXIPIME) 2 g in sodium chloride 0.9 % 100 mL IVPB  Status:  Discontinued        2 g 200 mL/hr over 30 Minutes Intravenous Every 8 hours 11/13/20 1542 11/15/20 0726   11/13/20 0000  cefTRIAXone (ROCEPHIN) 2 g in sodium chloride 0.9 % 100 mL IVPB        2 g 200 mL/hr over 30 Minutes Intravenous  Once 11/12/20 2352 11/13/20 0042       I have personally reviewed the following labs and images: CBC: Recent Labs  Lab 11/12/20 2144 11/14/20 0448 11/15/20 0253 11/16/20 0608  WBC 15.6* 20.5* 23.0* 20.3*  NEUTROABS 13.2*  --   --   --   HGB 13.2 11.7* 12.6* 12.0*  HCT 38.1* 34.4* 35.6* 34.1*  MCV 93.6 95.8 93.4 94.5  PLT 217 271 368 362   BMP &GFR Recent Labs  Lab 11/12/20 2144 11/14/20 0448 11/15/20 0253 11/16/20 0608  NA 126* 128* 130* 135  K 4.2 5.1 5.0 5.1  CL 93* 92* 95* 99  CO2 23 20* 21* 21*  GLUCOSE 139* 304* 324* 264*  BUN 30* 24* 28* 44*  CREATININE 1.11 1.27* 1.22 1.15  CALCIUM 8.7* 8.8* 8.7* 8.9  MG  --   --  2.2 2.3  PHOS  --   --  3.6 4.1   Estimated Creatinine Clearance: 70.4 mL/min (by C-G formula based on SCr of 1.15 mg/dL). Liver &  Pancreas: Recent Labs  Lab 11/12/20 2144 11/15/20 0253 11/16/20 0608  AST 77* 54*  32  ALT 82* 65* 69*  ALKPHOS 223* 244* 183*  BILITOT 0.3 1.0 1.0  PROT 6.7 5.9* 5.8*  ALBUMIN 3.2* 2.2* 2.2*   No results for input(s): LIPASE, AMYLASE in the last 168 hours. No results for input(s): AMMONIA in the last 168 hours. Diabetic: Recent Labs    11/13/20 1256  HGBA1C 6.7*   Recent Labs  Lab 11/15/20 1113 11/15/20 1709 11/15/20 2056 11/16/20 0640 11/16/20 1146  GLUCAP 320* 265* 219* 247* 290*   Cardiac Enzymes: No results for input(s): CKTOTAL, CKMB, CKMBINDEX, TROPONINI in the last 168 hours. No results for input(s): PROBNP in the last 8760 hours. Coagulation Profile: No results for input(s): INR, PROTIME in the last 168 hours. Thyroid Function Tests: Recent Labs    11/13/20 1256  TSH 1.931   Lipid Profile: Recent Labs    11/15/20 0253  CHOL 198  HDL 11*  LDLCALC UNABLE TO CALCULATE IF TRIGLYCERIDE OVER 400 mg/dL  TRIG 433*  CHOLHDL 18.0  LDLDIRECT 64.5   Anemia Panel: No results for input(s): VITAMINB12, FOLATE, FERRITIN, TIBC, IRON, RETICCTPCT in the last 72 hours. Urine analysis:    Component Value Date/Time   COLORURINE YELLOW 11/12/2020 1832   APPEARANCEUR CLEAR 11/12/2020 1832   LABSPEC 1.015 11/12/2020 1832   PHURINE 5.0 11/12/2020 1832   GLUCOSEU NEGATIVE 11/12/2020 1832   HGBUR NEGATIVE 11/12/2020 1832   BILIRUBINUR NEGATIVE 11/12/2020 1832   KETONESUR NEGATIVE 11/12/2020 1832   PROTEINUR NEGATIVE 11/12/2020 1832   NITRITE NEGATIVE 11/12/2020 1832   LEUKOCYTESUR NEGATIVE 11/12/2020 1832   Sepsis Labs: Invalid input(s): PROCALCITONIN, [place]  Microbiology: Recent Results (from the past 240 hour(s))  Culture, blood (routine x 2)     Status: None (Preliminary result)   Collection Time: 11/12/20  9:36 PM   Specimen: BLOOD  Result Value Ref Range Status   Specimen Description   Final    BLOOD Blood Culture adequate volume Performed at [facility], [address] [place], [place] [address]    Special Requests   Final    BOTTLES DRAWN AEROBIC AND ANAEROBIC RIGHT ANTECUBITAL Performed at [facility], [address] [place], [place] [address]    Culture   Final    NO GROWTH 2 DAYS Performed at [facility], [place] [address] [place], [place] [address]    Report Status PENDING  Incomplete  SARS CORONAVIRUS 2 (TAT 6-24 HRS) Nasopharyngeal     Status: None   Collection Time: 11/12/20  9:44 PM   Specimen: Nasopharyngeal  Result Value Ref Range Status   SARS Coronavirus 2 NEGATIVE NEGATIVE Final    Comment: (NOTE) SARS-CoV-2 target nucleic acids are NOT DETECTED.  The SARS-CoV-2 RNA is generally detectable in upper and lower respiratory specimens during the acute phase of infection. Negative results do not preclude SARS-CoV-2 infection, do not rule out co-infections with other pathogens, and should not be used as the sole basis for treatment or other patient management decisions. Negative results must be combined with clinical observations, patient history, and epidemiological information. The expected result is Negative.  Fact Sheet for Patients: SugarRoll.be  Fact Sheet for Healthcare Providers: https://www.woods-mathews.com/  This test is not yet approved or cleared by the [place] FDA and  has been authorized for detection and/or diagnosis of SARS-CoV-2 by FDA under an Emergency Use Authorization (EUA). This EUA will remain  in effect (meaning this test can be used) for the duration of the COVID-19 declaration under Se ction  564(b)(1) of the Act, 21 U.S.C. section 360bbb-3(b)(1), unless the authorization is terminated or revoked sooner.  Performed at [facility], [place] [address], [place], [place] [address]   Culture, blood (routine x 2)     Status: None (Preliminary result)   Collection Time: 11/12/20 10:11 PM   Specimen: BLOOD   Result Value Ref Range Status   Specimen Description   Final    BLOOD BLOOD RIGHT HAND Performed at [facility] [address] [place], [place] [address]    Special Requests   Final    BOTTLES DRAWN AEROBIC AND ANAEROBIC Blood Culture adequate volume Performed at [facility], [address] [place], [place] [address]    Culture   Final    NO GROWTH 2 DAYS Performed at [facility], [place] [address], [place], [place] [address]    Report Status PENDING  Incomplete    Radiology Studies: DG CHEST PORT 1 VIEW  Result Date: 11/15/2020 CLINICAL DATA:  Shortness of breath EXAM: PORTABLE CHEST 1 VIEW COMPARISON:  11/14/2020 FINDINGS: Cardiomegaly, vascular congestion. Patchy opacity in the left upper lobe is similar to prior study, new since 11/12/2020 concerning for infiltrate/pneumonia. Bibasilar atelectasis or infiltrates. No effusions or acute bony abnormality. IMPRESSION: Cardiomegaly, vascular congestion. Patchy left upper lobe and bibasilar opacities could reflect infiltrate/pneumonia. Electronically Signed   By: Rolm Baptise M.D.   On: 11/15/2020 21:00   DG Abd Portable 1V  Result Date: 11/15/2020 CLINICAL DATA:  Nausea, vomiting. EXAM: PORTABLE ABDOMEN - 1 VIEW COMPARISON:  None. FINDINGS: Mild gastric distention is noted. No evidence of large or small bowel dilatation is noted. No radio-opaque calculi or other significant radiographic abnormality are seen. IMPRESSION: Mild gastric distention is noted. No definite evidence of bowel obstruction or ileus. Electronically Signed   By: Marijo Conception M.D.   On: 11/15/2020 14:56    Tallin Hart T. [place]  If 7PM-7AM, please contact night-coverage [email or web address] 11/16/2020, 12:17 PM

## 2020-11-17 DIAGNOSIS — L03116 Cellulitis of left lower limb: Secondary | ICD-10-CM | POA: Diagnosis not present

## 2020-11-17 DIAGNOSIS — I10 Essential (primary) hypertension: Secondary | ICD-10-CM | POA: Diagnosis not present

## 2020-11-17 DIAGNOSIS — L03115 Cellulitis of right lower limb: Secondary | ICD-10-CM | POA: Diagnosis not present

## 2020-11-17 DIAGNOSIS — I214 Non-ST elevation (NSTEMI) myocardial infarction: Secondary | ICD-10-CM | POA: Diagnosis not present

## 2020-11-17 DIAGNOSIS — L0390 Cellulitis, unspecified: Secondary | ICD-10-CM | POA: Diagnosis not present

## 2020-11-17 LAB — CBC
HCT: 30.7 % — ABNORMAL LOW (ref 39.0–52.0)
Hemoglobin: 10.8 g/dL — ABNORMAL LOW (ref 13.0–17.0)
MCH: 32.8 pg (ref 26.0–34.0)
MCHC: 35.2 g/dL (ref 30.0–36.0)
MCV: 93.3 fL (ref 80.0–100.0)
Platelets: 379 10*3/uL (ref 150–400)
RBC: 3.29 MIL/uL — ABNORMAL LOW (ref 4.22–5.81)
RDW: 14.1 % (ref 11.5–15.5)
WBC: 21.9 10*3/uL — ABNORMAL HIGH (ref 4.0–10.5)
nRBC: 0 % (ref 0.0–0.2)

## 2020-11-17 LAB — GLUCOSE, CAPILLARY
Glucose-Capillary: 240 mg/dL — ABNORMAL HIGH (ref 70–99)
Glucose-Capillary: 209 mg/dL — ABNORMAL HIGH (ref 70–99)
Glucose-Capillary: 128 mg/dL — ABNORMAL HIGH (ref 70–99)
Glucose-Capillary: 97 mg/dL (ref 70–99)

## 2020-11-17 LAB — RENAL FUNCTION PANEL
Albumin: 2 g/dL — ABNORMAL LOW (ref 3.5–5.0)
Anion gap: 10 (ref 5–15)
BUN: 57 mg/dL — ABNORMAL HIGH (ref 8–23)
CO2: 26 mmol/L (ref 22–32)
Calcium: 8.8 mg/dL — ABNORMAL LOW (ref 8.9–10.3)
Chloride: 98 mmol/L (ref 98–111)
Creatinine, Ser: 1.15 mg/dL (ref 0.61–1.24)
GFR, Estimated: >60 mL/min (ref >60)
Glucose, Bld: 188 mg/dL — ABNORMAL HIGH (ref 70–99)
Phosphorus: 4 mg/dL (ref 2.5–4.6)
Potassium: 4.6 mmol/L (ref 3.5–5.1)
Sodium: 134 mmol/L — ABNORMAL LOW (ref 135–145)

## 2020-11-17 LAB — MAGNESIUM: Magnesium: 2.4 mg/dL (ref 1.7–2.4)

## 2020-11-17 LAB — HEPARIN LEVEL (UNFRACTIONATED): Heparin Unfractionated: 0.55 IU/mL (ref 0.30–0.70)

## 2020-11-17 MED ORDER — SODIUM CHLORIDE 0.9 % IV SOLN
250.0000 mL | INTRAVENOUS | Status: DC | PRN
  Administered 2020-11-18: 250 mL via INTRAVENOUS

## 2020-11-17 MED ORDER — LINEZOLID 600 MG PO TABS
600.0000 mg | ORAL_TABLET | Freq: Two times a day (BID) | ORAL | Status: AC
  Administered 2020-11-17 – 2020-11-19 (×6): 600 mg via ORAL
  Filled 2020-11-17 (×7): qty 1

## 2020-11-17 MED ORDER — INSULIN GLARGINE 100 UNIT/ML ~~LOC~~ SOLN
45.0000 [IU] | Freq: Two times a day (BID) | SUBCUTANEOUS | Status: DC
  Administered 2020-11-17 (×2): 45 [IU] via SUBCUTANEOUS
  Filled 2020-11-17 (×3): qty 0.45

## 2020-11-17 MED ORDER — SODIUM CHLORIDE 0.9 % IV SOLN: INTRAVENOUS | Status: DC

## 2020-11-17 MED ORDER — SODIUM CHLORIDE 0.9% FLUSH: 3.0000 mL | INTRAVENOUS | Status: DC | PRN

## 2020-11-17 MED ORDER — ASPIRIN 81 MG PO CHEW
81.0000 mg | CHEWABLE_TABLET | ORAL | Status: AC
Start: 2020-11-18 — End: 2020-11-18
  Administered 2020-11-18: 81 mg via ORAL
  Filled 2020-11-17: qty 1

## 2020-11-17 MED ORDER — SODIUM CHLORIDE 0.9% FLUSH
3.0000 mL | Freq: Two times a day (BID) | INTRAVENOUS | Status: DC
  Administered 2020-11-17 – 2020-11-18 (×2): 3 mL via INTRAVENOUS

## 2020-11-17 NOTE — Progress Notes (Signed)
Progress Note  Patient Name: Jeremy Hickman Date of Encounter: 11/17/2020  [facility] Cardiologist: No primary care provider on file.   Subjective   In chair, fairly comfortable, no significant shortness of breath.  He states that his leg is improving daily.  Inpatient Medications    Scheduled Meds: . aspirin EC  81 mg Oral Daily  . atorvastatin  80 mg Oral Daily  . busPIRone  15 mg Oral QPM  . busPIRone  30 mg Oral Q0600  . carvedilol  12.5 mg Oral BID WC  . docusate sodium  100 mg Oral BID  . donepezil  5 mg Oral QHS  . ezetimibe  10 mg Oral Daily  . FLUoxetine  20 mg Oral Daily  . folic acid  1 mg Oral Daily  . guaiFENesin  600 mg Oral BID  . hydrocerin   Topical Daily  . insulin aspart  0-20 Units Subcutaneous TID WC  . insulin aspart  0-5 Units Subcutaneous QHS  . insulin aspart  12 Units Subcutaneous TID WC  . insulin glargine  45 Units Subcutaneous BID  . linezolid  600 mg Oral Q12H  . loratadine  10 mg Oral Daily  . melatonin  3 mg Oral QHS  . metoCLOPramide (REGLAN) injection  5 mg Intravenous Q8H  . multivitamin with minerals  1 tablet Oral Daily  . nutrition supplement (JUVEN)  1 packet Oral BID BM  . pantoprazole  40 mg Oral Daily  . Ensure Max Protein  11 oz Oral QHS  . sodium chloride flush  3 mL Intravenous Q12H  . terazosin  5 mg Oral QHS  . thiamine  100 mg Oral Daily   Or  . thiamine  100 mg Intravenous Daily   Continuous Infusions: . sodium chloride 10 mL/hr at 11/14/20 0151  . ampicillin-sulbactam (UNASYN) IV 3 g (11/17/20 0847)  . heparin 1,900 Units/hr (11/17/20 0724)   PRN Meds: acetaminophen **OR** acetaminophen, alum & mag hydroxide-simeth, bisacodyl, diphenhydrAMINE, hydrALAZINE, HYDROcodone-acetaminophen, ipratropium-albuterol, morphine injection, nitroGLYCERIN, ondansetron **OR** ondansetron (ZOFRAN) IV, polyethylene glycol, temazepam   Vital Signs    Vitals:   11/17/20 0030 11/17/20 0406 11/17/20 0500 11/17/20 0854  BP:  124/64 (!) 145/76  129/67  Pulse: 65 77  72  Resp: 18 18    Temp: 97.6 F (36.4 C) 98.1 F (36.7 C)  (!) 97.4 F (36.3 C)  TempSrc: Oral Oral  Oral  SpO2: 98% 96%    Weight:   108.9 kg   Height:        Intake/Output Summary (Last 24 hours) at 11/17/2020 1029 Last data filed at 11/17/2020 0724 Gross per 24 hour  Intake 932.93 ml  Output 700 ml  Net 232.93 ml   Last 3 Weights 11/17/2020 11/13/2020 11/12/2020  Weight (lbs) 240 lb 1.3 oz 239 lb 10.2 oz 242 lb  Weight (kg) 108.9 kg 108.7 kg 109.77 kg      Telemetry    Sinus rhythm with occasional PVCs, couplet noted- Personally Reviewed  ECG    No new- Personally Reviewed  Physical Exam   GEN: No acute distress.   Neck: No JVD Cardiac: RRR, no murmurs, rubs, or gallops.  Respiratory: Clear to auscultation bilaterally. GI: Soft, nontender, non-distended  MS:  Right lower extremity erythema/improved edema; No deformity. Neuro:  Nonfocal  Psych: Normal affect   Labs    High Sensitivity Troponin:   Recent Labs  Lab 11/14/20 1656 11/14/20 1916 11/15/20 0802  TROPONINIHS 6,603* 7,382* 4,065*  Chemistry Recent Labs  Lab 11/12/20 2144 11/14/20 0448 11/15/20 0253 11/16/20 0608 11/17/20 0249  NA 126*   < > 130* 135 134*  K 4.2   < > 5.0 5.1 4.6  CL 93*   < > 95* 99 98  CO2 23   < > 21* 21* 26  GLUCOSE 139*   < > 324* 264* 188*  BUN 30*   < > 28* 44* 57*  CREATININE 1.11   < > 1.22 1.15 1.15  CALCIUM 8.7*   < > 8.7* 8.9 8.8*  PROT 6.7  --  5.9* 5.8*  --   ALBUMIN 3.2*  --  2.2* 2.2* 2.0*  AST 77*  --  54* 32  --   ALT 82*  --  65* 69*  --   ALKPHOS 223*  --  244* 183*  --   BILITOT 0.3  --  1.0 1.0  --   GFRNONAA >60   < > >60 >60 >60  ANIONGAP 10   < > 14 15 10    < > = values in this interval not displayed.     Hematology Recent Labs  Lab 11/15/20 0253 11/16/20 0608 11/17/20 0249  WBC 23.0* 20.3* 21.9*  RBC 3.81* 3.61* 3.29*  HGB 12.6* 12.0* 10.8*  HCT 35.6* 34.1* 30.7*  MCV 93.4 94.5 93.3  MCH  33.1 33.2 32.8  MCHC 35.4 35.2 35.2  RDW 13.8 14.0 14.1  PLT 368 362 379    BNP Recent Labs  Lab 11/14/20 0448  BNP 859.3*     DDimer No results for input(s): DDIMER in the last 168 hours.   Radiology    DG CHEST PORT 1 VIEW  Result Date: 11/15/2020 CLINICAL DATA:  Shortness of breath EXAM: PORTABLE CHEST 1 VIEW COMPARISON:  11/14/2020 FINDINGS: Cardiomegaly, vascular congestion. Patchy opacity in the left upper lobe is similar to prior study, new since 11/12/2020 concerning for infiltrate/pneumonia. Bibasilar atelectasis or infiltrates. No effusions or acute bony abnormality. IMPRESSION: Cardiomegaly, vascular congestion. Patchy left upper lobe and bibasilar opacities could reflect infiltrate/pneumonia. Electronically Signed   By: Rolm Baptise M.D.   On: 11/15/2020 21:00   DG Abd Portable 1V  Result Date: 11/15/2020 CLINICAL DATA:  Nausea, vomiting. EXAM: PORTABLE ABDOMEN - 1 VIEW COMPARISON:  None. FINDINGS: Mild gastric distention is noted. No evidence of large or small bowel dilatation is noted. No radio-opaque calculi or other significant radiographic abnormality are seen. IMPRESSION: Mild gastric distention is noted. No definite evidence of bowel obstruction or ileus. Electronically Signed   By: Marijo Conception M.D.   On: 11/15/2020 14:56    Cardiac Studies   Echo this admission:  1. Left ventricular ejection fraction, by estimation, is 40 to 45%. The  left ventricle has mildly decreased function. The left ventricle  demonstrates regional wall motion abnormalities (see scoring  diagram/findings for description). There is mild left  ventricular hypertrophy. Left ventricular diastolic parameters are  consistent with Grade I diastolic dysfunction (impaired relaxation).  Elevated left atrial pressure.  2. Right ventricular systolic function is normal. The right ventricular  size is normal.  3. The mitral valve is normal in structure. No evidence of mitral valve   regurgitation. No evidence of mitral stenosis.  4. The aortic valve is normal in structure. Aortic valve regurgitation is  not visualized. Mild to moderate aortic valve sclerosis/calcification is  present, without any evidence of aortic stenosis.  5. The inferior vena cava is normal in size with greater than 50%  respiratory variability, suggesting right atrial pressure of 3 mmHg.   Patient Profile     70 y.o. 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 Gresham     NSTEMI  - High-sensitivity troponin 6,603>> 7382 - Echo showed LVEF of 40-45% with akinesis of inferior wall and posterior wall as well as grade 1 diastolic function.EF down from 50-55% from last Echo in 2020 at [facility] (did have hypokinesis ofinferolateral, inferoseptal, and inferior walls at that time). - IV Heparin - Aspirin 81mg  daily.  - increased coreg to 12.5 mg Bid for elevated BP yesterday  - Lipitor 80mg  daily and continue Zetia which he takes at home. Was on Pravastatin at home. - cardiac catheterization on Tuesday, tomorrow.  Risks and benefits including stroke heart attack death renal impairment bleeding have been discussed.  He is willing to proceed.  Acute on Chronic Combined CHF - BNP elevated in the 800's. -He receivedIV lasix 40 mg. Reassess daily. No I/O, unable to assess UOP for response.  Other than lower extremity edema from cellulitis, no overt signals of congestion.  Breathing reasonably.  We will hold his IV diuresis prior to catheterization. - increased coreg as above - Home Telmisartan was held on admission due to "borderline renal function." OK to continue to hold for now to allow for diuresis but would restart ARB prior to discharge  given reduced EF. - Monitor daily weight, strict I/O's, and renal function.   Hypertension - BP better today after increasing the carvedilol yesterday - Home Telmisartan and Spironolactone currently on hold due to increase in creatinine.  Creatinine currently 1.15, improved.  We will continue to hold this until he has his heart catheterization tomorrow.  Hyperlipidemia - On Pravastatin and Zetia at home. -Started Lipitor 80mg  daily here, high intensity statin, and continue Zetia.  Diabetes Mellitus - Management per primary team.  Right Lower Extremity Cellulitis  - Continue antibiotics per primary team.  Improving day by day.  No growth in blood cultures.    Palliative care notes reviewed as well.  Appreciate their service. For questions or updates, please contact [place] Please consult [email or web address] for contact info under        Signed, Candee Furbish, MD  11/17/2020, 10:29 AM

## 2020-11-17 NOTE — Progress Notes (Signed)
Physical Therapy Treatment Patient Details Name: Jeremy Hickman MRN: [id number] DOB: Jun 09, 1951 Today's Date: 11/17/2020    History of Present Illness Patient is a 70 y/o male who presents with RLE redness, swelling and weeping. Found to have RLE cellulitis, failed outpatient antibiotics. Found to have NSTEMI. PLan for cardiac cath 11/17/20. PMH includes OSA on CPAP, HTN, DM-2, morbid obesity, anxiety and dementia.    PT Comments    Patient progressing well towards PT goals. Reports no pain in RLE however states it looks worse today than it did yesterday with regards to redness/swelling. Requires Min A to stand from EOB and Min guard from [facility]. Ambulation looks much safer with use of RW for support. Encouraged use of RW at home for all mobility. Pt upset that he is still in the hospital and his cath is postponed to tomorrow. "Am I being held hostage here?" Continues to have poor memory and overall awareness of medical condition/situation. Will follow.    Follow Up Recommendations  Home health PT;Supervision for mobility/OOB     Equipment Recommendations  Rolling walker with 5" wheels    Recommendations for Other Services       Precautions / Restrictions Precautions Precautions: Fall Restrictions Weight Bearing Restrictions: No    Mobility  Bed Mobility Overal bed mobility: Needs Assistance Bed Mobility: Supine to Sit     Supine to sit: Min guard;HOB elevated     General bed mobility comments: Increased time/effort and use of rail to get to EOB.  Transfers Overall transfer level: Needs assistance Equipment used: Rolling walker (2 wheeled) Transfers: Sit to/from Stand Sit to Stand: Min assist         General transfer comment: Assist to power and steady in standing with cues for hand placement; stood from EOB x1, from [facility] x1, transferred to chair post ambulation.  Ambulation/Gait Ambulation/Gait assistance: Min guard Gait Distance (Feet): 25 Feet Assistive device:  Rolling walker (2 wheeled) Gait Pattern/deviations: Step-through pattern;Decreased stride length;Trunk flexed;Shuffle Gait velocity: decreased   General Gait Details: Slow, mildly unsteady gait with use of RW; no pain with WB through RLE today.   Stairs             Wheelchair Mobility    Modified Rankin (Stroke Patients Only)       Balance Overall balance assessment: Needs assistance Sitting-balance support: Feet supported;No upper extremity supported Sitting balance-Leahy Scale: Good     Standing balance support: During functional activity Standing balance-Leahy Scale: Poor Standing balance comment: Requires UE support in standing. Total A for pericare                            Cognition Arousal/Alertness: Awake/alert Behavior During Therapy: WFL for tasks assessed/performed Overall Cognitive Status: Impaired/Different from baseline Area of Impairment: Memory;Following commands;Awareness;Safety/judgement;Problem solving                     Memory: Decreased short-term memory Following Commands: Follows one step commands with increased time;Follows one step commands consistently Safety/Judgement: Decreased awareness of safety;Decreased awareness of deficits Awareness: Intellectual Problem Solving: Requires verbal cues;Slow processing General Comments: "Am I being held hostage here?" Does not recall why they are keeping him in the hospital or the reason for admission re: NSTEMI, "when did that happen?"      Exercises      General Comments General comments (skin integrity, edema, etc.): Swelling and redness present RLE- reports it looks worse than yesterday. Cath  postponed and pt upset about that. Bed saturated in urine.      Pertinent Vitals/Pain Pain Assessment: No/denies pain    Home Living                      Prior Function            PT Goals (current goals can now be found in the care plan section) Progress towards PT  goals: Progressing toward goals    Frequency    Min 3X/week      PT Plan Current plan remains appropriate    Co-evaluation              AM-PAC PT "6 Clicks" Mobility   Outcome Measure  Help needed turning from your back to your side while in a flat bed without using bedrails?: A Little Help needed moving from lying on your back to sitting on the side of a flat bed without using bedrails?: A Little Help needed moving to and from a bed to a chair (including a wheelchair)?: A Little Help needed standing up from a chair using your arms (e.g., wheelchair or bedside chair)?: A Little Help needed to walk in hospital room?: A Little Help needed climbing 3-5 steps with a railing? : A Lot 6 Click Score: 17    End of Session Equipment Utilized During Treatment: Gait belt Activity Tolerance: Patient tolerated treatment well Patient left: in chair;with call bell/phone within reach;with chair alarm set;with nursing/sitter in room Nurse Communication: Mobility status PT Visit Diagnosis: Unsteadiness on feet (R26.81);Muscle weakness (generalized) (M62.81);Other abnormalities of gait and mobility (R26.89)     Time: 7893-8101 PT Time Calculation (min) (ACUTE ONLY): 31 min  Charges:  $Gait Training: 8-22 mins $Therapeutic Activity: 8-22 mins                     Marisa Severin, PT, DPT Acute Rehabilitation Services Pager [phone] Office [place] 11/17/2020, 11:33 AM

## 2020-11-17 NOTE — Evaluation (Signed)
Occupational Therapy Evaluation Patient Details Name: Jeremy Hickman MRN: [id number] DOB: 1951/03/11 Today's Date: 11/17/2020    History of Present Illness Patient is a 70 y/o male who presents with RLE redness, swelling and weeping. Found to have RLE cellulitis, failed outpatient antibiotics. Found to have NSTEMI. PLan for cardiac cath 11/18/20. PMH includes OSA on CPAP, HTN, DM-2, morbid obesity, anxiety and dementia.   Clinical Impression   Pt admitted with the above diagnosis and has the deficits listed below. Pt would benefit from cont OT to increase independence with basic adls and adl transfers so he can d/c home safely with his wife at home who is w/c bound.  Wife can care for her self but pt needs to be very independent.  Feel he can achieve this and get follow up therapy at [facility] level.  Will focus on tasks in standing and functional adls on pt's feet at next session.  Cardiac cath planned for tomorrow.     Follow Up Recommendations  Home health OT;Supervision/Assistance - 24 hour    Equipment Recommendations  None recommended by OT    Recommendations for Other Services       Precautions / Restrictions Precautions Precautions: Fall Restrictions Weight Bearing Restrictions: No      Mobility Bed Mobility Overal bed mobility: Needs Assistance Bed Mobility: Supine to Sit     Supine to sit: Min guard;HOB elevated     General bed mobility comments: Increased time/effort and use of rail to get to EOB.    Transfers Overall transfer level: Needs assistance Equipment used: Rolling walker (2 wheeled) Transfers: Sit to/from [facility] Sit to Stand: Min assist Stand pivot transfers: Min assist       General transfer comment: Pt stood x2 with min assist.  Pt requires assist to power up and cues for where to place hands during transfer.    Balance Overall balance assessment: Needs assistance Sitting-balance support: Feet supported;No upper extremity  supported Sitting balance-Leahy Scale: Good     Standing balance support: During functional activity Standing balance-Leahy Scale: Poor Standing balance comment: Pt relies on walker to remain in standing.                           ADL either performed or assessed with clinical judgement   ADL Overall ADL's : Needs assistance/impaired Eating/Feeding: Set up;Sitting   Grooming: Oral care;Brushing hair;Wash/dry face;Wash/dry hands;Set up;Sitting   Upper Body Bathing: Set up;Sitting   Lower Body Bathing: Minimal assistance;Sit to/from stand;Cueing for compensatory techniques   Upper Body Dressing : Set up;Sitting   Lower Body Dressing: Sit to/from stand;Moderate assistance;Cueing for compensatory techniques Lower Body Dressing Details (indicate cue type and reason): assist with pants, socks and shoes and min assist anytime in standing due to decreased balance. Toilet Transfer: Minimal assistance;RW;BSC;Stand-pivot   Toileting- Clothing Manipulation and Hygiene: Minimal assistance;Sit to/from stand       Functional mobility during ADLs: Minimal assistance;Rolling walker General ADL Comments: Pt limited with LE adls and limited with adls in standing due to poor balance.     Vision Baseline Vision/History: Wears glasses Wears Glasses: Reading only;At all times Patient Visual Report: No change from baseline Vision Assessment?: No apparent visual deficits     Perception Perception Perception Tested?: No   Praxis Praxis Praxis tested?: Within functional limits    Pertinent Vitals/Pain Pain Assessment: Faces Pain Score: 2  Pain Location: leg Pain Descriptors / Indicators: Discomfort Pain Intervention(s): Limited activity  within patient's tolerance;Monitored during session;Repositioned     Hand Dominance Right   Extremity/Trunk Assessment Upper Extremity Assessment Upper Extremity Assessment: Overall WFL for tasks assessed   Lower Extremity Assessment Lower  Extremity Assessment: Defer to PT evaluation RLE Deficits / Details: Erythema and swelling present   Cervical / Trunk Assessment Cervical / Trunk Assessment: Normal   Communication Communication Communication: No difficulties   Cognition Arousal/Alertness: Awake/alert Behavior During Therapy: WFL for tasks assessed/performed Overall Cognitive Status: History of cognitive impairments - at baseline Area of Impairment: Memory;Awareness;Problem solving                     Memory: Decreased short-term memory Following Commands: Follows one step commands consistently Safety/Judgement: Decreased awareness of safety;Decreased awareness of deficits Awareness: Emergent Problem Solving: Slow processing;Requires verbal cues General Comments: Pt appears more cognitively intact today compared to yesterday but continues to have some deficits (which he did have at baseline)   General Comments  Pt with very swollen, red, RLE which is painful when standing therefore prolonged standing limited.    Exercises     Shoulder Instructions      Home Living Family/patient expects to be discharged to:: Private residence Living Arrangements: Spouse/significant other Available Help at Discharge: Family;Available 24 hours/day Type of Home: House Home Access: Level entry     Home Layout: One level     Bathroom Shower/Tub: Tub/shower unit;Curtain   Biochemist, clinical: Standard     Home Equipment: Cane - quad;Shower seat - built in;Toilet riser;Walker - 2 wheels;Wheelchair - manual          Prior Functioning/Environment Level of Independence: Independent        Comments: Independent for ADLs/IADLs. Drives. No falls in last 6 months. Reports wife is mainly in a w/c.        OT Problem List: Decreased strength;Impaired balance (sitting and/or standing);Decreased cognition;Decreased safety awareness;Decreased knowledge of use of DME or AE;Pain;Increased edema;Obesity      OT  Treatment/Interventions: Self-care/ADL training;Therapeutic activities;DME and/or AE instruction    OT Goals(Current goals can be found in the care plan section) Acute Rehab OT Goals Patient Stated Goal: to go home soon OT Goal Formulation: With patient Time For Goal Achievement: 12/01/20 Potential to Achieve Goals: Good ADL Goals Pt Will Perform Grooming: with supervision;standing Pt Will Perform Lower Body Bathing: with supervision;sit to/from stand Pt Will Perform Lower Body Dressing: with supervision;sit to/from stand Additional ADL Goal #1: PT wil walk to bathroom with walker and toilet with 3:1 over commode with supervision.  OT Frequency: Min 2X/week   Barriers to D/C: Decreased caregiver support  wife at home is in wc but is independent       Co-evaluation              AM-PAC OT "6 Clicks" Daily Activity     Outcome Measure Help from another person eating meals?: None Help from another person taking care of personal grooming?: None Help from another person toileting, which includes using toliet, bedpan, or urinal?: A Little Help from another person bathing (including washing, rinsing, drying)?: A Little Help from another person to put on and taking off regular upper body clothing?: None Help from another person to put on and taking off regular lower body clothing?: A Lot 6 Click Score: 20   End of Session Equipment Utilized During Treatment: Rolling walker Nurse Communication: Mobility status;Other (comment) (need to change catheter)  Activity Tolerance: Patient tolerated treatment well Patient left: in chair;with call bell/phone  within reach;with bed alarm set  OT Visit Diagnosis: Unsteadiness on feet (R26.81);Muscle weakness (generalized) (M62.81);Other symptoms and signs involving cognitive function;Pain Pain - Right/Left: Right Pain - part of body: Leg                Time: 1250-1317 OT Time Calculation (min): 27 min Charges:  OT General Charges $OT Visit:  1 Visit OT Evaluation $OT Eval Moderate Complexity: 1 Mod OT Treatments $Self Care/Home Management : 8-22 mins  Glenford Peers 11/17/2020, 1:28 PM

## 2020-11-17 NOTE — Progress Notes (Signed)
PROGRESS NOTE  Jeremy Hickman ZOX:[id number] DOB: [birth year]   PCP: Malachi Pro [name], PA-C  Patient is from: Home. Independently ambulates at baseline.  DOA: 11/12/2020 LOS: 4  Chief complaints: Right leg swelling, redness and weeping  Brief Narrative / Interim history: 70 year old male with PMH of OSA on CPAP, HTN, DM-2, morbid obesity, anxiety and dementia presented to [facility] ED with RLE swelling, redness, weeping, pain and difficulty bearing weight, and admitted for cellulitis after he failed outpatient treatment with p.o. clindamycin. His LE [place] was negative for DVT on 11/11/2020 at [facility].  Initially started on IV ceftriaxone but antibiotics cannulated to cefepime and vancomycin.  The next day, patient developed respiratory distress. CXR without acute finding. BNP elevated to 800.  Respiratory distress improved after brief BiPAP, IV Lasix, DuoNeb and anxiolytics.  TTE showed LVEF of 40 to 45%, G1 DD and RWMA.  Troponin elevated to 6600.  Started on heparin drip and cardiology consulted.  Plan for [facility] on 2/8.   Subjective: Seen and examined earlier this morning.  No major events overnight of this morning.  Sitting on bedside chair.  He denies chest pain, dyspnea, GI or UTI symptoms.  Reports improvement in leg swelling.  Objective: Vitals:   11/17/20 0030 11/17/20 0406 11/17/20 0500 11/17/20 0854  BP: 124/64 (!) 145/76  129/67  Pulse: 65 77  72  Resp: 18 18    Temp: 97.6 F (36.4 C) 98.1 F (36.7 C)  (!) 97.4 F (36.3 C)  TempSrc: Oral Oral  Oral  SpO2: 98% 96%    Weight:   108.9 kg   Height:        Intake/Output Summary (Last 24 hours) at 11/17/2020 1230 Last data filed at 11/17/2020 0724 Gross per 24 hour  Intake 932.93 ml  Output 700 ml  Net 232.93 ml   Filed Weights   11/12/20 2316 11/13/20 1526 11/17/20 0500  Weight: 109.8 kg 108.7 kg 108.9 kg    Examination:  GENERAL: No apparent distress.  Nontoxic. HEENT: MMM.  Vision and hearing grossly intact.  NECK:  Supple.  No apparent JVD.  RESP: 96% on RA.  No IWOB.  Fair aeration bilaterally. CVS:  RRR. Heart sounds normal.  ABD/GI/GU: BS+. Abd soft, NTND.  MSK/EXT:  Moves extremities. No apparent deformity.  Trace edema. SKIN: Mild RLE skin erythema but improved tremendously.  No increased warmth to touch. NEURO: Awake, alert and oriented appropriately.  No apparent focal neuro deficit. PSYCH: Calm. Normal affect.  Procedures:  None  Microbiology summarized: COVID-19 PCR nonreactive. Blood cultures NGTD.  Assessment & Plan: Non-STEMI in patient with remote history of RCA MI: No chest pain but respiratory distress, diaphoresis and emesis.  Twelve-lead EKG reviewed by me shows sinus rhythm with RBBB and PVC unchanged from his old EKG.  Troponin peaked at 7400 and trended down to 4000. TC 198.  HDL 11.  TG 433.  TTE as above.  Patient without chest pain. -Continue IV heparin, Lipitor 80 mg daily and home Zetia -Cardiology increased Coreg to 12.5 mg twice daily -Plan for [facility] on 2/8.  Acute on chronic combined CHF: TTE as above. BNP 860 (no prior to compare to).  No significant pulmonary edema on CXR.  Has some RLE swelling although in the setting of cellulitis and chronic venous insufficiency.  Diuresed with IV Lasix and fluid status and renal function improved. -Diuretics on hold for planned LHC tomorrow -GDMT-on Coreg.  -Home ARB on hold.  -Closely monitor fluid status, renal functions and  electrolytes  Sepsis due to RLE Cellulitis: POA.  Failed outpatient p.o. antibiotics.  Cellulitis improved clinically but persistent leukocytosis partly due to steroid.   -RLE [place] negative for DVT on 2/1 per care everywhere. -IV vancomycin 2/3-2/7>> p.o. Zyvox>> 2/10 -Cefepime 2/3-2/5>> Unasyn>> 2/9 -Diabetic control  Essential hypertension: Normotensive. -Coreg, Terazosin and Lasix as above -Home Micardis on hold  Controlled IDDM-1 with hyperglycemia and hyperlipidemia: A1c 6.7%.  Recent Labs  Lab  11/16/20 1146 11/16/20 1711 11/16/20 2050 11/17/20 0636 11/17/20 1220  GLUCAP 290* 307* 212* 240* 209*  -Continue SSI-resistant -Continue NovoLog 12 units AC -Increase Lantus from 40 to 45 units twice daily -High intensity statin as above -Further adjustment as appropriate  Elevated liver enzymes: Could be due to non-STEMI/sepsis.  Improving. -Continue monitoring  Hyponatremia: Resolved. -Continue monitoring  Dementia without behavioral disturbance: Fairly oriented. -Continue Buspar, Prozac, Restoril, Melatonin, trazodone -Continue Aricept  Anxiety/insomnia: Seems stable now. -Continue home BuSpar, Prozac, trazodone, Restoril and melatonin -Continue as needed Ativan  Alcohol use: Reports drinking 2 beers a day. -Continue CIWA protocol with as needed Ativan  OSA on CPAP -Continue nightly CPAP  Morbid obesity -Weight loss should be encouraged -Outpatient PCP/bariatric medicine f/u encouraged Body mass index is 36.5 kg/m. Nutrition Problem: Increased nutrient needs Etiology: acute illness Signs/Symptoms: estimated needs Interventions: MVI,Juven,Premier Protein   DVT prophylaxis:  Subcu Lovenox  Code Status: DNR/DNI Family Communication: Updated patient's wife over the phone Level of care: Progressive Status is: Inpatient  Remains inpatient appropriate because:Ongoing diagnostic testing needed not appropriate for outpatient work up, Unsafe d/c plan, IV treatments appropriate due to intensity of illness or inability to take PO and Inpatient level of care appropriate due to severity of illness   Dispo: The patient is from: Home              Anticipated d/c is to: Home              Anticipated d/c date is: 2 days              Patient currently is not medically stable to d/c.   Difficult to place patient No       Consultants:  Cardiology   Sch Meds:  Scheduled Meds: . aspirin EC  81 mg Oral Daily  . atorvastatin  80 mg Oral Daily  . busPIRone  15 mg  Oral QPM  . busPIRone  30 mg Oral Q0600  . carvedilol  12.5 mg Oral BID WC  . docusate sodium  100 mg Oral BID  . donepezil  5 mg Oral QHS  . ezetimibe  10 mg Oral Daily  . FLUoxetine  20 mg Oral Daily  . folic acid  1 mg Oral Daily  . guaiFENesin  600 mg Oral BID  . hydrocerin   Topical Daily  . insulin aspart  0-20 Units Subcutaneous TID WC  . insulin aspart  0-5 Units Subcutaneous QHS  . insulin aspart  12 Units Subcutaneous TID WC  . insulin glargine  45 Units Subcutaneous BID  . linezolid  600 mg Oral Q12H  . loratadine  10 mg Oral Daily  . melatonin  3 mg Oral QHS  . metoCLOPramide (REGLAN) injection  5 mg Intravenous Q8H  . multivitamin with minerals  1 tablet Oral Daily  . nutrition supplement (JUVEN)  1 packet Oral BID BM  . pantoprazole  40 mg Oral Daily  . Ensure Max Protein  11 oz Oral QHS  . sodium chloride flush  3 mL  Intravenous Q12H  . sodium chloride flush  3 mL Intravenous Q12H  . terazosin  5 mg Oral QHS  . thiamine  100 mg Oral Daily   Or  . thiamine  100 mg Intravenous Daily   Continuous Infusions: . sodium chloride 10 mL/hr at 11/14/20 0151  . ampicillin-sulbactam (UNASYN) IV 3 g (11/17/20 0847)  . heparin 1,900 Units/hr (11/17/20 0724)   PRN Meds:.acetaminophen **OR** acetaminophen, alum & mag hydroxide-simeth, bisacodyl, diphenhydrAMINE, hydrALAZINE, HYDROcodone-acetaminophen, ipratropium-albuterol, morphine injection, nitroGLYCERIN, ondansetron **OR** ondansetron (ZOFRAN) IV, polyethylene glycol, temazepam  Antimicrobials: Anti-infectives (From admission, onward)   Start     Dose/Rate Route Frequency Ordered Stop   11/17/20 1115  linezolid (ZYVOX) tablet 600 mg        600 mg Oral Every 12 hours 11/17/20 1023 11/20/20 0959   11/15/20 0800  Ampicillin-Sulbactam (UNASYN) 3 g in sodium chloride 0.9 % 100 mL IVPB        3 g 200 mL/hr over 30 Minutes Intravenous Every 6 hours 11/15/20 0726 11/19/20 2359   11/14/20 1630  vancomycin (VANCOREADY) IVPB 1500  mg/300 mL  Status:  Discontinued        1,500 mg 150 mL/hr over 120 Minutes Intravenous Every 24 hours 11/13/20 1542 11/17/20 1023   11/13/20 2300  cefTRIAXone (ROCEPHIN) 1 g in sodium chloride 0.9 % 100 mL IVPB  Status:  Discontinued        1 g 200 mL/hr over 30 Minutes Intravenous Every 24 hours 11/13/20 1158 11/13/20 1510   11/13/20 1600  vancomycin (VANCOCIN) 2,250 mg in sodium chloride 0.9 % 500 mL IVPB        2,250 mg 250 mL/hr over 120 Minutes Intravenous  Once 11/13/20 1510 11/13/20 1855   11/13/20 1600  ceFEPIme (MAXIPIME) 2 g in sodium chloride 0.9 % 100 mL IVPB  Status:  Discontinued        2 g 200 mL/hr over 30 Minutes Intravenous  Once 11/13/20 1510 11/13/20 1542   11/13/20 1600  ceFEPIme (MAXIPIME) 2 g in sodium chloride 0.9 % 100 mL IVPB  Status:  Discontinued        2 g 200 mL/hr over 30 Minutes Intravenous Every 8 hours 11/13/20 1542 11/15/20 0726   11/13/20 0000  cefTRIAXone (ROCEPHIN) 2 g in sodium chloride 0.9 % 100 mL IVPB        2 g 200 mL/hr over 30 Minutes Intravenous  Once 11/12/20 2352 11/13/20 0042       I have personally reviewed the following labs and images: CBC: Recent Labs  Lab 11/12/20 2144 11/14/20 0448 11/15/20 0253 11/16/20 0608 11/17/20 0249  WBC 15.6* 20.5* 23.0* 20.3* 21.9*  NEUTROABS 13.2*  --   --   --   --   HGB 13.2 11.7* 12.6* 12.0* 10.8*  HCT 38.1* 34.4* 35.6* 34.1* 30.7*  MCV 93.6 95.8 93.4 94.5 93.3  PLT 217 271 368 362 379   BMP &GFR Recent Labs  Lab 11/12/20 2144 11/14/20 0448 11/15/20 0253 11/16/20 0608 11/17/20 0249  NA 126* 128* 130* 135 134*  K 4.2 5.1 5.0 5.1 4.6  CL 93* 92* 95* 99 98  CO2 23 20* 21* 21* 26  GLUCOSE 139* 304* 324* 264* 188*  BUN 30* 24* 28* 44* 57*  CREATININE 1.11 1.27* 1.22 1.15 1.15  CALCIUM 8.7* 8.8* 8.7* 8.9 8.8*  MG  --   --  2.2 2.3 2.4  PHOS  --   --  3.6 4.1 4.0   Estimated Creatinine Clearance:  70.5 mL/min (by C-G formula based on SCr of 1.15 mg/dL). Liver & Pancreas: Recent  Labs  Lab 11/12/20 2144 11/15/20 0253 11/16/20 0608 11/17/20 0249  AST 77* 54* 32  --   ALT 82* 65* 69*  --   ALKPHOS 223* 244* 183*  --   BILITOT 0.3 1.0 1.0  --   PROT 6.7 5.9* 5.8*  --   ALBUMIN 3.2* 2.2* 2.2* 2.0*   No results for input(s): LIPASE, AMYLASE in the last 168 hours. No results for input(s): AMMONIA in the last 168 hours. Diabetic: No results for input(s): HGBA1C in the last 72 hours. Recent Labs  Lab 11/16/20 1146 11/16/20 1711 11/16/20 2050 11/17/20 0636 11/17/20 1220  GLUCAP 290* 307* 212* 240* 209*   Cardiac Enzymes: No results for input(s): CKTOTAL, CKMB, CKMBINDEX, TROPONINI in the last 168 hours. No results for input(s): PROBNP in the last 8760 hours. Coagulation Profile: No results for input(s): INR, PROTIME in the last 168 hours. Thyroid Function Tests: No results for input(s): TSH, T4TOTAL, FREET4, T3FREE, THYROIDAB in the last 72 hours. Lipid Profile: Recent Labs    11/15/20 0253  CHOL 198  HDL 11*  LDLCALC UNABLE TO CALCULATE IF TRIGLYCERIDE OVER 400 mg/dL  TRIG 433*  CHOLHDL 18.0  LDLDIRECT 64.5   Anemia Panel: No results for input(s): VITAMINB12, FOLATE, FERRITIN, TIBC, IRON, RETICCTPCT in the last 72 hours. Urine analysis:    Component Value Date/Time   COLORURINE YELLOW 11/12/2020 1832   APPEARANCEUR CLEAR 11/12/2020 1832   LABSPEC 1.015 11/12/2020 1832   PHURINE 5.0 11/12/2020 1832   GLUCOSEU NEGATIVE 11/12/2020 1832   HGBUR NEGATIVE 11/12/2020 1832   BILIRUBINUR NEGATIVE 11/12/2020 1832   KETONESUR NEGATIVE 11/12/2020 1832   PROTEINUR NEGATIVE 11/12/2020 1832   NITRITE NEGATIVE 11/12/2020 1832   LEUKOCYTESUR NEGATIVE 11/12/2020 1832   Sepsis Labs: Invalid input(s): PROCALCITONIN, [place]  Microbiology: Recent Results (from the past 240 hour(s))  Culture, blood (routine x 2)     Status: None (Preliminary result)   Collection Time: 11/12/20  9:36 PM   Specimen: BLOOD  Result Value Ref Range Status   Specimen  Description   Final    BLOOD Blood Culture adequate volume Performed at [facility], [address] [place], [place] [address]    Special Requests   Final    BOTTLES DRAWN AEROBIC AND ANAEROBIC RIGHT ANTECUBITAL Performed at [facility], [address] [place], [place] [address]    Culture   Final    NO GROWTH 4 DAYS Performed at [facility], [place] [address], [place], [place] [address]    Report Status PENDING  Incomplete  SARS CORONAVIRUS 2 (TAT 6-24 HRS) Nasopharyngeal     Status: None   Collection Time: 11/12/20  9:44 PM   Specimen: Nasopharyngeal  Result Value Ref Range Status   SARS Coronavirus 2 NEGATIVE NEGATIVE Final    Comment: (NOTE) SARS-CoV-2 target nucleic acids are NOT DETECTED.  The SARS-CoV-2 RNA is generally detectable in upper and lower respiratory specimens during the acute phase of infection. Negative results do not preclude SARS-CoV-2 infection, do not rule out co-infections with other pathogens, and should not be used as the sole basis for treatment or other patient management decisions. Negative results must be combined with clinical observations, patient history, and epidemiological information. The expected result is Negative.  Fact Sheet for Patients: SugarRoll.be  Fact Sheet for Healthcare Providers: https://www.woods-mathews.com/  This test is not yet approved or cleared  by the [place] and  has been authorized for detection and/or diagnosis of SARS-CoV-2 by FDA under an Emergency Use Authorization (EUA). This EUA will remain  in effect (meaning this test can be used) for the duration of the COVID-19 declaration under Se ction 564(b)(1) of the Act, 21 U.S.C. section 360bbb-3(b)(1), unless the authorization is terminated or revoked sooner.  Performed at [facility], [place] [address] [place], [place] [address]   Culture, blood (routine x 2)     Status: None  (Preliminary result)   Collection Time: 11/12/20 10:11 PM   Specimen: BLOOD  Result Value Ref Range Status   Specimen Description   Final    BLOOD BLOOD RIGHT HAND Performed at [facility], [address] [place], [place] [address]    Special Requests   Final    BOTTLES DRAWN AEROBIC AND ANAEROBIC Blood Culture adequate volume Performed at [facility], [address] [place], [place] [address]    Culture   Final    NO GROWTH 4 DAYS Performed at [facility], [place] [address], [place], [place] [address]    Report Status PENDING  Incomplete  MRSA PCR Screening     Status: None   Collection Time: 11/16/20  3:03 PM   Specimen: Nasal Mucosa; Nasopharyngeal  Result Value Ref Range Status   MRSA by PCR NEGATIVE NEGATIVE Final    Comment:        The GeneXpert MRSA Assay (FDA approved for NASAL specimens only), is one component of a comprehensive MRSA colonization surveillance program. It is not intended to diagnose MRSA infection nor to guide or monitor treatment for MRSA infections. Performed at [facility], [place] [address], [place], [place] [address]     Radiology Studies: No results found.  Erwin Nishiyama T. Gilliam  If 7PM-7AM, please contact night-coverage [email or web address] 11/17/2020, 12:30 PM

## 2020-11-17 NOTE — Progress Notes (Signed)
[place] for IV Heparin Indication: chest pain/ACS  Allergies  Allergen Reactions  . Iodinated Diagnostic Agents Hives    Patient Measurements: Height: 5\' 8"  (172.7 cm) Weight: 108.9 kg (240 lb 1.3 oz) IBW/kg (Calculated) : 68.4 Heparin Dosing Weight: 92.5 kg  Vital Signs: Temp: 97.4 F (36.3 C) (02/07 0854) Temp Source: Oral (02/07 0854) BP: 129/67 (02/07 0854) Pulse Rate: 72 (02/07 0854)  Labs: Recent Labs     0000 11/14/20 1656 11/14/20 1916 11/15/20 0253 11/15/20 0802 11/15/20 1037 11/16/20 0608 11/16/20 1504 11/17/20 0249  HGB   < >  --   --  12.6*  --   --  12.0*  --  10.8*  HCT  --   --   --  35.6*  --   --  34.1*  --  30.7*  PLT  --   --   --  368  --   --  362  --  379  HEPARINUNFRC  --   --   --  0.33  --    < > 0.25* 0.45 0.55  CREATININE  --   --   --  1.22  --   --  1.15  --  1.15  TROPONINIHS  --  6,603* 7,382*  --  4,065*  --   --   --   --    < > = values in this interval not displayed.    Estimated Creatinine Clearance: 70.5 mL/min (by C-G formula based on SCr of 1.15 mg/dL).   Assessment: 70 yr old man admitted on 11/12/20 with R leg cellulitis. This afternoon, troponin markedly elevated at 6603; Cardiology was consulted. Pharmacy was consulted to dose heparin for ACS.  Heparin level therapeutic this AM. Plan for possible cath today or tomorrow depending on cellulitis improvement. He is now on D5 of abx for his cellulitis. D/w Dr. Gwynneth Macleod and we will change his vanc to zyvox to complete an additional 2d.   Goal of Therapy:  Heparin level 0.3-0.7 units/ml Monitor platelets by anticoagulation protocol: Yes   Plan:  Cont heparin infusion 1900 units/hr Daily HL, CBC Dc vanc Zyvox 600mg  PO BID x2 more days  Onnie Boer, PharmD, [place], AAHIVP, CPP Infectious Disease Pharmacist 11/17/2020 10:28 AM

## 2020-11-17 NOTE — Progress Notes (Signed)
This chaplain responded to PMT consult for spiritual care.  The Pt. is sleeping at the time of the chaplain's visit.  The chaplain checked in with the Pt. RN-Anna and will F/U with spiritual care.

## 2020-11-17 NOTE — Care Management Important Message (Signed)
Important Message  Patient Details  Name: Jeremy Hickman MRN: [id number] Date of Birth: 1951/05/30   Medicare Important Message Given:  Yes     Orbie Pyo 11/17/2020, 3:03 PM

## 2020-11-18 ENCOUNTER — Inpatient Hospital Stay (HOSPITAL_COMMUNITY)
Admission: EM | Disposition: A | Discharge status: Home / Self Care | Payer: Self-pay | Source: Home / Self Care | Attending: Student

## 2020-11-18 ENCOUNTER — Encounter (HOSPITAL_COMMUNITY): Payer: Self-pay | Admitting: Cardiovascular Disease

## 2020-11-18 DIAGNOSIS — D72825 Bandemia: Secondary | ICD-10-CM

## 2020-11-18 DIAGNOSIS — R0602 Shortness of breath: Secondary | ICD-10-CM

## 2020-11-18 DIAGNOSIS — I10 Essential (primary) hypertension: Secondary | ICD-10-CM | POA: Diagnosis not present

## 2020-11-18 DIAGNOSIS — L03115 Cellulitis of right lower limb: Secondary | ICD-10-CM | POA: Diagnosis not present

## 2020-11-18 DIAGNOSIS — E160 Drug-induced hypoglycemia without coma: Secondary | ICD-10-CM

## 2020-11-18 DIAGNOSIS — T383X5A Adverse effect of insulin and oral hypoglycemic [antidiabetic] drugs, initial encounter: Secondary | ICD-10-CM

## 2020-11-18 DIAGNOSIS — I2510 Atherosclerotic heart disease of native coronary artery without angina pectoris: Secondary | ICD-10-CM

## 2020-11-18 DIAGNOSIS — L03116 Cellulitis of left lower limb: Secondary | ICD-10-CM | POA: Diagnosis not present

## 2020-11-18 DIAGNOSIS — I214 Non-ST elevation (NSTEMI) myocardial infarction: Secondary | ICD-10-CM | POA: Diagnosis not present

## 2020-11-18 DIAGNOSIS — L0390 Cellulitis, unspecified: Secondary | ICD-10-CM | POA: Diagnosis not present

## 2020-11-18 HISTORY — PX: RIGHT/LEFT HEART CATH AND CORONARY ANGIOGRAPHY: CATH118266

## 2020-11-18 LAB — RENAL FUNCTION PANEL
Albumin: 2.2 g/dL — ABNORMAL LOW (ref 3.5–5.0)
Anion gap: 12 (ref 5–15)
BUN: 46 mg/dL — ABNORMAL HIGH (ref 8–23)
CO2: 24 mmol/L (ref 22–32)
Calcium: 9.1 mg/dL (ref 8.9–10.3)
Chloride: 101 mmol/L (ref 98–111)
Creatinine, Ser: 0.94 mg/dL (ref 0.61–1.24)
GFR, Estimated: >60 mL/min (ref >60)
Glucose, Bld: 57 mg/dL — ABNORMAL LOW (ref 70–99)
Phosphorus: 4.1 mg/dL (ref 2.5–4.6)
Potassium: 4 mmol/L (ref 3.5–5.1)
Sodium: 137 mmol/L (ref 135–145)

## 2020-11-18 LAB — POCT I-STAT 7, (LYTES, BLD GAS, ICA,H+H)
Acid-Base Excess: 4 mmol/L — ABNORMAL HIGH (ref 0.0–2.0)
Acid-Base Excess: 5 mmol/L — ABNORMAL HIGH (ref 0.0–2.0)
Bicarbonate: 28.5 mmol/L — ABNORMAL HIGH (ref 20.0–28.0)
Bicarbonate: 29.9 mmol/L — ABNORMAL HIGH (ref 20.0–28.0)
Calcium, Ion: 1.22 mmol/L (ref 1.15–1.40)
Calcium, Ion: 1.27 mmol/L (ref 1.15–1.40)
HCT: 34 % — ABNORMAL LOW (ref 39.0–52.0)
HCT: 37 % — ABNORMAL LOW (ref 39.0–52.0)
Hemoglobin: 11.6 g/dL — ABNORMAL LOW (ref 13.0–17.0)
Hemoglobin: 12.6 g/dL — ABNORMAL LOW (ref 13.0–17.0)
O2 Saturation: 98 %
O2 Saturation: 99 %
Potassium: 4.4 mmol/L (ref 3.5–5.1)
Potassium: 4.6 mmol/L (ref 3.5–5.1)
Sodium: 139 mmol/L (ref 135–145)
Sodium: 141 mmol/L (ref 135–145)
TCO2: 30 mmol/L (ref 22–32)
TCO2: 31 mmol/L (ref 22–32)
pCO2 arterial: 42.4 mmHg (ref 32.0–48.0)
pCO2 arterial: 44.2 mmHg (ref 32.0–48.0)
pH, Arterial: 7.418 (ref 7.350–7.450)
pH, Arterial: 7.457 — ABNORMAL HIGH (ref 7.350–7.450)
pO2, Arterial: 125 mmHg — ABNORMAL HIGH (ref 83.0–108.0)
pO2, Arterial: 97 mmHg (ref 83.0–108.0)

## 2020-11-18 LAB — POCT I-STAT EG7
Acid-Base Excess: 6 mmol/L — ABNORMAL HIGH (ref 0.0–2.0)
Acid-Base Excess: 6 mmol/L — ABNORMAL HIGH (ref 0.0–2.0)
Bicarbonate: 31.2 mmol/L — ABNORMAL HIGH (ref 20.0–28.0)
Bicarbonate: 31.7 mmol/L — ABNORMAL HIGH (ref 20.0–28.0)
Calcium, Ion: 1.28 mmol/L (ref 1.15–1.40)
Calcium, Ion: 1.29 mmol/L (ref 1.15–1.40)
HCT: 36 % — ABNORMAL LOW (ref 39.0–52.0)
HCT: 36 % — ABNORMAL LOW (ref 39.0–52.0)
Hemoglobin: 12.2 g/dL — ABNORMAL LOW (ref 13.0–17.0)
Hemoglobin: 12.2 g/dL — ABNORMAL LOW (ref 13.0–17.0)
O2 Saturation: 68 %
O2 Saturation: 70 %
Potassium: 4.6 mmol/L (ref 3.5–5.1)
Potassium: 4.6 mmol/L (ref 3.5–5.1)
Sodium: 138 mmol/L (ref 135–145)
Sodium: 138 mmol/L (ref 135–145)
TCO2: 33 mmol/L — ABNORMAL HIGH (ref 22–32)
TCO2: 33 mmol/L — ABNORMAL HIGH (ref 22–32)
pCO2, Ven: 47.8 mmHg (ref 44.0–60.0)
pCO2, Ven: 48.3 mmHg (ref 44.0–60.0)
pH, Ven: 7.423 (ref 7.250–7.430)
pH, Ven: 7.425 (ref 7.250–7.430)
pO2, Ven: 35 mmHg (ref 32.0–45.0)
pO2, Ven: 36 mmHg (ref 32.0–45.0)

## 2020-11-18 LAB — CBC
HCT: 33.8 % — ABNORMAL LOW (ref 39.0–52.0)
Hemoglobin: 11.2 g/dL — ABNORMAL LOW (ref 13.0–17.0)
MCH: 31.6 pg (ref 26.0–34.0)
MCHC: 33.1 g/dL (ref 30.0–36.0)
MCV: 95.5 fL (ref 80.0–100.0)
Platelets: 409 10*3/uL — ABNORMAL HIGH (ref 150–400)
RBC: 3.54 MIL/uL — ABNORMAL LOW (ref 4.22–5.81)
RDW: 13.8 % (ref 11.5–15.5)
WBC: 16.9 10*3/uL — ABNORMAL HIGH (ref 4.0–10.5)
nRBC: 0 % (ref 0.0–0.2)

## 2020-11-18 LAB — GLUCOSE, CAPILLARY
Glucose-Capillary: 143 mg/dL — ABNORMAL HIGH (ref 70–99)
Glucose-Capillary: 51 mg/dL — ABNORMAL LOW (ref 70–99)
Glucose-Capillary: 78 mg/dL (ref 70–99)
Glucose-Capillary: 82 mg/dL (ref 70–99)
Glucose-Capillary: 100 mg/dL — ABNORMAL HIGH (ref 70–99)
Glucose-Capillary: 247 mg/dL — ABNORMAL HIGH (ref 70–99)
Glucose-Capillary: 294 mg/dL — ABNORMAL HIGH (ref 70–99)

## 2020-11-18 LAB — SURGICAL PCR SCREEN
MRSA, PCR: NEGATIVE
Staphylococcus aureus: NEGATIVE

## 2020-11-18 LAB — CULTURE, BLOOD (ROUTINE X 2)
Culture: NO GROWTH
Culture: NO GROWTH
Special Requests: ADEQUATE
Specimen Description: ADEQUATE

## 2020-11-18 LAB — MAGNESIUM: Magnesium: 2.2 mg/dL (ref 1.7–2.4)

## 2020-11-18 LAB — HEPARIN LEVEL (UNFRACTIONATED): Heparin Unfractionated: 0.54 IU/mL (ref 0.30–0.70)

## 2020-11-18 SURGERY — RIGHT/LEFT HEART CATH AND CORONARY ANGIOGRAPHY
Anesthesia: LOCAL

## 2020-11-18 MED ORDER — SODIUM CHLORIDE 0.9 % IV SOLN: 250.0000 mL | INTRAVENOUS | Status: DC | PRN

## 2020-11-18 MED ORDER — NITROGLYCERIN IN D5W 200-5 MCG/ML-% IV SOLN
0.0000 ug/min | INTRAVENOUS | Status: DC
  Administered 2020-11-18: 10 ug/min via INTRAVENOUS

## 2020-11-18 MED ORDER — ACETAMINOPHEN 325 MG PO TABS: 650.0000 mg | ORAL_TABLET | ORAL | Status: DC | PRN

## 2020-11-18 MED ORDER — HEPARIN (PORCINE) IN NACL 1000-0.9 UT/500ML-% IV SOLN
INTRAVENOUS | Status: DC | PRN
  Administered 2020-11-18 (×2): 500 mL

## 2020-11-18 MED ORDER — DEXTROSE 50 % IV SOLN
25.0000 g | INTRAVENOUS | Status: AC
  Administered 2020-11-18: 25 g via INTRAVENOUS
  Filled 2020-11-18: qty 50

## 2020-11-18 MED ORDER — METHYLPREDNISOLONE SODIUM SUCC 125 MG IJ SOLR
125.0000 mg | Freq: Once | INTRAMUSCULAR | Status: AC
  Administered 2020-11-18: 125 mg via INTRAVENOUS
  Filled 2020-11-18: qty 2

## 2020-11-18 MED ORDER — LIDOCAINE HCL (PF) 1 % IJ SOLN
INTRAMUSCULAR | Status: AC
  Filled 2020-11-18: qty 30

## 2020-11-18 MED ORDER — SODIUM CHLORIDE 0.9% FLUSH: 3.0000 mL | INTRAVENOUS | Status: DC | PRN

## 2020-11-18 MED ORDER — IPRATROPIUM-ALBUTEROL 0.5-2.5 (3) MG/3ML IN SOLN
RESPIRATORY_TRACT | Status: AC
  Filled 2020-11-18: qty 3

## 2020-11-18 MED ORDER — FUROSEMIDE 10 MG/ML IJ SOLN
INTRAMUSCULAR | Status: DC | PRN
  Administered 2020-11-18: 60 mg via INTRAVENOUS
  Administered 2020-11-18: 20 mg via INTRAVENOUS

## 2020-11-18 MED ORDER — HEPARIN SODIUM (PORCINE) 1000 UNIT/ML IJ SOLN
INTRAMUSCULAR | Status: DC | PRN
  Administered 2020-11-18: 5500 [IU] via INTRAVENOUS

## 2020-11-18 MED ORDER — ONDANSETRON HCL 4 MG/2ML IJ SOLN: 4.0000 mg | Freq: Four times a day (QID) | INTRAMUSCULAR | Status: DC | PRN

## 2020-11-18 MED ORDER — FENTANYL CITRATE (PF) 100 MCG/2ML IJ SOLN
INTRAMUSCULAR | Status: AC
  Filled 2020-11-18: qty 2

## 2020-11-18 MED ORDER — VERAPAMIL HCL 2.5 MG/ML IV SOLN
INTRAVENOUS | Status: AC
  Filled 2020-11-18: qty 2

## 2020-11-18 MED ORDER — DIPHENHYDRAMINE HCL 50 MG/ML IJ SOLN
25.0000 mg | Freq: Once | INTRAMUSCULAR | Status: AC
  Administered 2020-11-18: 25 mg via INTRAVENOUS
  Filled 2020-11-18: qty 1

## 2020-11-18 MED ORDER — LABETALOL HCL 5 MG/ML IV SOLN
INTRAVENOUS | Status: AC
  Filled 2020-11-18: qty 4

## 2020-11-18 MED ORDER — INSULIN ASPART 100 UNIT/ML ~~LOC~~ SOLN
0.0000 [IU] | SUBCUTANEOUS | Status: DC
  Administered 2020-11-18: 3 [IU] via SUBCUTANEOUS
  Administered 2020-11-18: 5 [IU] via SUBCUTANEOUS
  Administered 2020-11-18 – 2020-11-19 (×2): 1 [IU] via SUBCUTANEOUS
  Administered 2020-11-19: 5 [IU] via SUBCUTANEOUS
  Administered 2020-11-19: 2 [IU] via SUBCUTANEOUS

## 2020-11-18 MED ORDER — LIDOCAINE HCL (PF) 1 % IJ SOLN
INTRAMUSCULAR | Status: DC | PRN
  Administered 2020-11-18 (×2): 2 mL

## 2020-11-18 MED ORDER — SODIUM CHLORIDE 0.9 % IV SOLN: INTRAVENOUS | Status: AC

## 2020-11-18 MED ORDER — ASPIRIN 81 MG PO CHEW
81.0000 mg | CHEWABLE_TABLET | Freq: Every day | ORAL | Status: DC
  Administered 2020-11-19 – 2020-11-20 (×2): 81 mg via ORAL
  Filled 2020-11-18 (×3): qty 1

## 2020-11-18 MED ORDER — HEPARIN SODIUM (PORCINE) 1000 UNIT/ML IJ SOLN
INTRAMUSCULAR | Status: AC
  Filled 2020-11-18: qty 1

## 2020-11-18 MED ORDER — FUROSEMIDE 10 MG/ML IJ SOLN
INTRAMUSCULAR | Status: AC
  Filled 2020-11-18: qty 8

## 2020-11-18 MED ORDER — LABETALOL HCL 5 MG/ML IV SOLN
10.0000 mg | INTRAVENOUS | Status: AC | PRN
  Administered 2020-11-18 (×2): 10 mg via INTRAVENOUS

## 2020-11-18 MED ORDER — FUROSEMIDE 10 MG/ML IJ SOLN
40.0000 mg | Freq: Two times a day (BID) | INTRAMUSCULAR | Status: DC
  Administered 2020-11-18 – 2020-11-20 (×5): 40 mg via INTRAVENOUS
  Filled 2020-11-18 (×5): qty 4

## 2020-11-18 MED ORDER — HEPARIN (PORCINE) 25000 UT/250ML-% IV SOLN
1850.0000 [IU]/h | INTRAVENOUS | Status: DC
  Administered 2020-11-18 – 2020-11-19 (×3): 1900 [IU]/h via INTRAVENOUS
  Administered 2020-11-21: 1850 [IU]/h via INTRAVENOUS
  Filled 2020-11-18 (×8): qty 250

## 2020-11-18 MED ORDER — POTASSIUM CHLORIDE CRYS ER 20 MEQ PO TBCR
40.0000 meq | EXTENDED_RELEASE_TABLET | Freq: Every day | ORAL | Status: DC
  Administered 2020-11-18 – 2020-11-20 (×3): 40 meq via ORAL
  Filled 2020-11-18 (×3): qty 2

## 2020-11-18 MED ORDER — ACETAMINOPHEN 325 MG PO TABS
650.0000 mg | ORAL_TABLET | ORAL | Status: DC | PRN
  Administered 2020-11-18: 650 mg via ORAL
  Filled 2020-11-18: qty 2

## 2020-11-18 MED ORDER — MIDAZOLAM HCL 2 MG/2ML IJ SOLN
INTRAMUSCULAR | Status: AC
  Filled 2020-11-18: qty 2

## 2020-11-18 MED ORDER — MIDAZOLAM HCL 2 MG/2ML IJ SOLN
INTRAMUSCULAR | Status: DC | PRN
  Administered 2020-11-18: 1 mg via INTRAVENOUS

## 2020-11-18 MED ORDER — NITROGLYCERIN IN D5W 200-5 MCG/ML-% IV SOLN
INTRAVENOUS | Status: AC
  Filled 2020-11-18: qty 250

## 2020-11-18 MED ORDER — HEPARIN (PORCINE) IN NACL 1000-0.9 UT/500ML-% IV SOLN
INTRAVENOUS | Status: AC
  Filled 2020-11-18: qty 1000

## 2020-11-18 MED ORDER — HYDRALAZINE HCL 20 MG/ML IJ SOLN: 10.0000 mg | INTRAMUSCULAR | Status: AC | PRN

## 2020-11-18 MED ORDER — INSULIN ASPART 100 UNIT/ML ~~LOC~~ SOLN: 0.0000 [IU] | SUBCUTANEOUS | Status: DC

## 2020-11-18 MED ORDER — IOHEXOL 350 MG/ML SOLN
INTRAVENOUS | Status: DC | PRN
  Administered 2020-11-18: 65 mL via INTRA_ARTERIAL

## 2020-11-18 MED ORDER — ATORVASTATIN CALCIUM 80 MG PO TABS
80.0000 mg | ORAL_TABLET | Freq: Every day | ORAL | Status: DC
  Administered 2020-11-19 – 2020-11-27 (×8): 80 mg via ORAL
  Filled 2020-11-18 (×8): qty 1

## 2020-11-18 MED ORDER — VERAPAMIL HCL 2.5 MG/ML IV SOLN
INTRAVENOUS | Status: DC | PRN
  Administered 2020-11-18: 10 mL via INTRA_ARTERIAL

## 2020-11-18 MED ORDER — SODIUM CHLORIDE 0.9% FLUSH
3.0000 mL | Freq: Two times a day (BID) | INTRAVENOUS | Status: DC
  Administered 2020-11-18 – 2020-11-20 (×5): 3 mL via INTRAVENOUS

## 2020-11-18 SURGICAL SUPPLY — 18 items
BAG SNAP BAND KOVER 36X36 (MISCELLANEOUS) ×2 IMPLANT
CATH BALLN WEDGE 5F 110CM (CATHETERS) ×2 IMPLANT
CATH INFINITI 5 FR JL3.5 (CATHETERS) ×2 IMPLANT
CATH OPTITORQUE TIG 4.0 5F (CATHETERS) ×2 IMPLANT
COVER DOME SNAP 22 D (MISCELLANEOUS) ×2 IMPLANT
DEVICE RAD COMP TR BAND LRG (VASCULAR PRODUCTS) ×2 IMPLANT
GLIDESHEATH SLEND SS 6F .021 (SHEATH) ×2 IMPLANT
GUIDEWIRE INQWIRE 1.5J.035X260 (WIRE) ×1 IMPLANT
INQWIRE 1.5J .035X260CM (WIRE) ×2
KIT HEART LEFT (KITS) ×2 IMPLANT
KIT MICROPUNCTURE NIT STIFF (SHEATH) ×2 IMPLANT
MAT PREVALON FULL STRYKER (MISCELLANEOUS) ×2 IMPLANT
PACK CARDIAC CATHETERIZATION (CUSTOM PROCEDURE TRAY) ×2 IMPLANT
PROTECTION STATION PRESSURIZED (MISCELLANEOUS) ×2
SHEATH GLIDE SLENDER 4/5FR (SHEATH) ×2 IMPLANT
STATION PROTECTION PRESSURIZED (MISCELLANEOUS) ×1 IMPLANT
TRANSDUCER W/STOPCOCK (MISCELLANEOUS) ×2 IMPLANT
TUBING CIL FLEX 10 FLL-RA (TUBING) ×2 IMPLANT

## 2020-11-18 NOTE — Progress Notes (Signed)
[place] for IV Heparin Indication: chest pain/ACS  Allergies  Allergen Reactions  . Iodinated Diagnostic Agents Hives    Patient Measurements: Height: 5\' 8"  (172.7 cm) Weight: 108.9 kg (240 lb 1.3 oz) IBW/kg (Calculated) : 68.4 Heparin Dosing Weight: 92.5 kg  Vital Signs: Temp: 97.3 F (36.3 C) (02/08 0749) Temp Source: Oral (02/08 0749) BP: 154/83 (02/08 0749) Pulse Rate: 74 (02/08 0749)  Labs: Recent Labs    11/16/20 0608 11/16/20 1504 11/17/20 0249 11/18/20 0138  HGB 12.0*  --  10.8* 11.2*  HCT 34.1*  --  30.7* 33.8*  PLT 362  --  379 409*  HEPARINUNFRC 0.25* 0.45 0.55 0.54  CREATININE 1.15  --  1.15 0.94    Estimated Creatinine Clearance: 86.3 mL/min (by C-G formula based on SCr of 0.94 mg/dL).   Assessment: 70 yr old man admitted on 11/12/20 with R leg cellulitis. This afternoon, troponin markedly elevated at 6603; Cardiology was consulted. Pharmacy was consulted to dose heparin for ACS.  Heparin level therapeutic this AM. Plan for cath today. Hgb/plt remain stable.   Goal of Therapy:  Heparin level 0.3-0.7 units/ml Monitor platelets by anticoagulation protocol: Yes   Plan:  Cont heparin infusion 1900 units/hr F/u after cath Daily HL, CBC  Onnie Boer, PharmD, BCIDP, AAHIVP, CPP Infectious Disease Pharmacist 11/18/2020 9:29 AM

## 2020-11-18 NOTE — Progress Notes (Signed)
Pt already off BIPAP. RT will continue to monitor as needed.

## 2020-11-18 NOTE — Progress Notes (Signed)
[place] for IV Heparin Indication: chest pain/ACS  Allergies  Allergen Reactions  . Iodinated Diagnostic Agents Hives    Patient Measurements: Height: 5\' 8"  (172.7 cm) Weight: 108.9 kg (240 lb 1.3 oz) IBW/kg (Calculated) : 68.4 Heparin Dosing Weight: 92.5 kg  Vital Signs: Temp: 97.3 F (36.3 C) (02/08 0749) Temp Source: Oral (02/08 0749) BP: 162/78 (02/08 1505) Pulse Rate: 65 (02/08 1505)  Labs: Recent Labs    11/16/20 0608 11/16/20 1504 11/17/20 0249 11/18/20 0138  HGB 12.0*  --  10.8* 11.2*  HCT 34.1*  --  30.7* 33.8*  PLT 362  --  379 409*  HEPARINUNFRC 0.25* 0.45 0.55 0.54  CREATININE 1.15  --  1.15 0.94    Estimated Creatinine Clearance: 86.3 mL/min (by C-G formula based on SCr of 0.94 mg/dL).   Assessment: 70 yr old man admitted on 11/12/20 with R leg cellulitis. This afternoon, troponin markedly elevated at 6603; Cardiology was consulted. Pharmacy was consulted to dose heparin for ACS 8 hours post sheath removal.   Heparin level was therapeutic this AM at 0.54 on 1900 units/hr. Hgb/plt have remained stable. Sheath removed on 2/8 at 1322.    Goal of Therapy:  Heparin level 0.3-0.7 units/ml Monitor platelets by anticoagulation protocol: Yes   Plan:  Resume heparin infusion at 1900 units/hr @2130  8 hour heparin level Daily HL, CBC  Romilda Garret, PharmD PGY1 Acute Care Pharmacy Resident 11/18/2020 3:20 PM  Please check [email or web address] for unit specific pharmacy phone numbers.

## 2020-11-18 NOTE — Progress Notes (Signed)
PROGRESS NOTE  Jeremy Hickman:[id number] DOB: Jul 27, 1951   PCP: Malachi Pro [name], PA-C  Patient is from: Home. Independently ambulates at baseline.  DOA: 11/12/2020 LOS: 5  Chief complaints: Right leg swelling, redness and weeping  Brief Narrative / Interim history: 70 year old male with PMH of OSA on CPAP, HTN, DM-2, morbid obesity, anxiety and dementia presented to [facility] ED with RLE swelling, redness, weeping, pain and difficulty bearing weight, and admitted for cellulitis after he failed outpatient treatment with p.o. clindamycin. His LE [place] was negative for DVT on 11/11/2020 at [facility].  Initially started on IV ceftriaxone but antibiotics cannulated to cefepime and vancomycin.  The next day, patient developed respiratory distress. CXR without acute finding. BNP elevated to 800.  Respiratory distress improved after brief BiPAP, IV Lasix, DuoNeb and anxiolytics.  TTE showed LVEF of 40 to 45%, G1 DD and RWMA.  Troponin elevated to 6600.  Started on heparin drip and cardiology consulted.  Patient is scheduled for LHC today.  Subjective: Seen and examined earlier this morning.  No major events overnight of this morning.  Unhappy about being NPO and waiting for LHC until noon.  He had an episode of hypoglycemia last night.  His insulin were adjusted.  Denies chest pain, dyspnea, GI or UTI symptoms.  Objective: Vitals:   11/17/20 1948 11/18/20 0439 11/18/20 0500 11/18/20 0749  BP: (!) 153/78 122/62  (!) 154/83  Pulse: 79 67  74  Resp: 17 17  (!) 25  Temp: 97.9 F (36.6 C) 97.8 F (36.6 C)  (!) 97.3 F (36.3 C)  TempSrc: Oral Oral  Oral  SpO2: 96% 98%  99%  Weight:   108.9 kg   Height:        Intake/Output Summary (Last 24 hours) at 11/18/2020 1055 Last data filed at 11/18/2020 0600 Gross per 24 hour  Intake 1208.62 ml  Output 250 ml  Net 958.62 ml   Filed Weights   11/13/20 1526 11/17/20 0500 11/18/20 0500  Weight: 108.7 kg 108.9 kg 108.9 kg    Examination:  GENERAL: No  apparent distress.  Nontoxic. HEENT: MMM.  Vision and hearing grossly intact.  NECK: Supple.  No apparent JVD.  RESP: 98% on RA.  No IWOB.  Fair aeration bilaterally with mild upper airway sound. CVS:  RRR. Heart sounds normal.  ABD/GI/GU: BS+. Abd soft, NTND.  MSK/EXT:  Moves extremities. No apparent deformity.  Trace edema, Rt>Lt SKIN: Some residual skin erythema over RLE but improved. NEURO: Awake, alert and oriented appropriately.  No apparent focal neuro deficit. PSYCH: Calm.  Unhappy medically due to n.p.o. for late [place].  Procedures:  None  Microbiology summarized: COVID-19 PCR nonreactive. Blood cultures NGTD.  Assessment & Plan: Non-STEMI in patient with remote history of RCA MI: No chest pain but respiratory distress, diaphoresis and emesis.  Twelve-lead EKG reviewed by me shows sinus rhythm with RBBB and PVC unchanged from his old EKG.  Troponin peaked at 7400 and trended down to 4000. TC 198.  HDL 11.  TG 433.  TTE as above.  Patient without chest pain. -Continue IV heparin, Lipitor 80 mg daily and home Zetia -Cardiology increased Coreg to 12.5 mg twice daily -Plan for LHC afternoon  Acute on chronic combined CHF: TTE as above. BNP 860 (no prior to compare to).  No significant pulmonary edema on CXR.  Has some RLE swelling although in the setting of cellulitis and chronic venous insufficiency.  Diuresed with IV Lasix which is on hold for LHC today.  I&O incomplete. -Diuretics on hold for LHC today -GDMT-on Coreg-increased to 12.5 mg twice daily by cardiology.  -Home ARB on hold.  -Closely monitor fluid status, renal functions and electrolytes  Sepsis due to RLE Cellulitis: POA.  Failed outpatient p.o. antibiotics.  Cellulitis improved.  -RLE [place] negative for DVT on 2/1 per care everywhere. -IV vancomycin 2/3-2/7>> p.o. Zyvox>> 2/10 -Cefepime 2/3-2/5>> Unasyn>> 2/9 -Diabetic control  Essential hypertension: Normotensive. -Coreg, Terazosin and Lasix as above -Home  [place] on hold  Controlled IDDM-1 with hyperglycemia, hypoglycemia, HLD and macrovascular complication: Z0C 5.8%.  Recent Labs  Lab 11/17/20 1818 11/17/20 2207 11/18/20 0345 11/18/20 0415 11/18/20 0838  GLUCAP 128* 97 51* 143* 78  -SSI decreased to sensitive scale due to hypoglycemia last night -basal and mealtime coverage discontinued. -High intensity statin as above -Further adjustment as appropriate  Elevated liver enzymes: Could be due to non-STEMI/sepsis.  Improving. -Continue monitoring  Hyponatremia: Resolved. -Continue monitoring  Dementia without behavioral disturbance: Fairly oriented. -Continue Buspar, Prozac, Restoril, Melatonin, trazodone -Continue Aricept  Anxiety/insomnia: Seems stable now. -Continue home BuSpar, Prozac, trazodone, Restoril and melatonin -Continue as needed Ativan  Alcohol use: Reports drinking 2 beers a day. -Discontinue CIWA monitoring  OSA on CPAP -Continue nightly CPAP  Leukocytosis/bandemia: Likely due to cellulitis.  Improving. -Continue monitoring  Morbid obesity -Weight loss should be encouraged -Outpatient PCP/bariatric medicine f/u encouraged Body mass index is 36.5 kg/m. Nutrition Problem: Increased nutrient needs Etiology: acute illness Signs/Symptoms: estimated needs Interventions: MVI,Juven,Premier Protein   DVT prophylaxis:  Subcu Lovenox  Code Status: DNR/DNI Family Communication: Updated patient's wife over the phone Level of care: Progressive Status is: Inpatient  Remains inpatient appropriate because:Ongoing diagnostic testing needed not appropriate for outpatient work up, Unsafe d/c plan and Inpatient level of care appropriate due to severity of illness   Dispo: The patient is from: Home              Anticipated d/c is to: Home              Anticipated d/c date is: 1 day              Patient currently is not medically stable to d/c.   Difficult to place patient No       Consultants:   Cardiology   Sch Meds:  Scheduled Meds: . aspirin EC  81 mg Oral Daily  . atorvastatin  80 mg Oral Daily  . busPIRone  15 mg Oral QPM  . busPIRone  30 mg Oral Q0600  . carvedilol  12.5 mg Oral BID WC  . docusate sodium  100 mg Oral BID  . donepezil  5 mg Oral QHS  . ezetimibe  10 mg Oral Daily  . FLUoxetine  20 mg Oral Daily  . folic acid  1 mg Oral Daily  . guaiFENesin  600 mg Oral BID  . hydrocerin   Topical Daily  . insulin aspart  0-9 Units Subcutaneous Q4H  . linezolid  600 mg Oral Q12H  . loratadine  10 mg Oral Daily  . melatonin  3 mg Oral QHS  . metoCLOPramide (REGLAN) injection  5 mg Intravenous Q8H  . multivitamin with minerals  1 tablet Oral Daily  . nutrition supplement (JUVEN)  1 packet Oral BID BM  . pantoprazole  40 mg Oral Daily  . Ensure Max Protein  11 oz Oral QHS  . sodium chloride flush  3 mL Intravenous Q12H  . sodium chloride flush  3 mL Intravenous Q12H  .  terazosin  5 mg Oral QHS  . thiamine  100 mg Oral Daily   Or  . thiamine  100 mg Intravenous Daily   Continuous Infusions: . sodium chloride 10 mL/hr at 11/14/20 0151  . sodium chloride 250 mL (11/18/20 0309)  . sodium chloride 10 mL/hr at 11/18/20 0553  . ampicillin-sulbactam (UNASYN) IV 3 g (11/18/20 0757)  . heparin 1,900 Units/hr (11/18/20 0309)   PRN Meds:.sodium chloride, acetaminophen **OR** acetaminophen, alum & mag hydroxide-simeth, bisacodyl, diphenhydrAMINE, hydrALAZINE, HYDROcodone-acetaminophen, ipratropium-albuterol, morphine injection, nitroGLYCERIN, ondansetron **OR** ondansetron (ZOFRAN) IV, polyethylene glycol, sodium chloride flush, temazepam  Antimicrobials: Anti-infectives (From admission, onward)   Start     Dose/Rate Route Frequency Ordered Stop   11/17/20 1115  linezolid (ZYVOX) tablet 600 mg        600 mg Oral Every 12 hours 11/17/20 1023 11/20/20 0959   11/15/20 0800  Ampicillin-Sulbactam (UNASYN) 3 g in sodium chloride 0.9 % 100 mL IVPB        3 g 200 mL/hr over 30  Minutes Intravenous Every 6 hours 11/15/20 0726 11/19/20 2359   11/14/20 1630  vancomycin (VANCOREADY) IVPB 1500 mg/300 mL  Status:  Discontinued        1,500 mg 150 mL/hr over 120 Minutes Intravenous Every 24 hours 11/13/20 1542 11/17/20 1023   11/13/20 2300  cefTRIAXone (ROCEPHIN) 1 g in sodium chloride 0.9 % 100 mL IVPB  Status:  Discontinued        1 g 200 mL/hr over 30 Minutes Intravenous Every 24 hours 11/13/20 1158 11/13/20 1510   11/13/20 1600  vancomycin (VANCOCIN) 2,250 mg in sodium chloride 0.9 % 500 mL IVPB        2,250 mg 250 mL/hr over 120 Minutes Intravenous  Once 11/13/20 1510 11/13/20 1855   11/13/20 1600  ceFEPIme (MAXIPIME) 2 g in sodium chloride 0.9 % 100 mL IVPB  Status:  Discontinued        2 g 200 mL/hr over 30 Minutes Intravenous  Once 11/13/20 1510 11/13/20 1542   11/13/20 1600  ceFEPIme (MAXIPIME) 2 g in sodium chloride 0.9 % 100 mL IVPB  Status:  Discontinued        2 g 200 mL/hr over 30 Minutes Intravenous Every 8 hours 11/13/20 1542 11/15/20 0726   11/13/20 0000  cefTRIAXone (ROCEPHIN) 2 g in sodium chloride 0.9 % 100 mL IVPB        2 g 200 mL/hr over 30 Minutes Intravenous  Once 11/12/20 2352 11/13/20 0042       I have personally reviewed the following labs and images: CBC: Recent Labs  Lab 11/12/20 2144 11/14/20 0448 11/15/20 0253 11/16/20 0608 11/17/20 0249 11/18/20 0138  WBC 15.6* 20.5* 23.0* 20.3* 21.9* 16.9*  NEUTROABS 13.2*  --   --   --   --   --   HGB 13.2 11.7* 12.6* 12.0* 10.8* 11.2*  HCT 38.1* 34.4* 35.6* 34.1* 30.7* 33.8*  MCV 93.6 95.8 93.4 94.5 93.3 95.5  PLT 217 271 368 362 379 409*   BMP &GFR Recent Labs  Lab 11/14/20 0448 11/15/20 0253 11/16/20 0608 11/17/20 0249 11/18/20 0138  NA 128* 130* 135 134* 137  K 5.1 5.0 5.1 4.6 4.0  CL 92* 95* 99 98 101  CO2 20* 21* 21* 26 24  GLUCOSE 304* 324* 264* 188* 57*  BUN 24* 28* 44* 57* 46*  CREATININE 1.27* 1.22 1.15 1.15 0.94  CALCIUM 8.8* 8.7* 8.9 8.8* 9.1  MG  --  2.2 2.3  2.4  2.2  PHOS  --  3.6 4.1 4.0 4.1   Estimated Creatinine Clearance: 86.3 mL/min (by C-G formula based on SCr of 0.94 mg/dL). Liver & Pancreas: Recent Labs  Lab 11/12/20 2144 11/15/20 0253 11/16/20 0608 11/17/20 0249 11/18/20 0138  AST 77* 54* 32  --   --   ALT 82* 65* 69*  --   --   ALKPHOS 223* 244* 183*  --   --   BILITOT 0.3 1.0 1.0  --   --   PROT 6.7 5.9* 5.8*  --   --   ALBUMIN 3.2* 2.2* 2.2* 2.0* 2.2*   No results for input(s): LIPASE, AMYLASE in the last 168 hours. No results for input(s): AMMONIA in the last 168 hours. Diabetic: No results for input(s): HGBA1C in the last 72 hours. Recent Labs  Lab 11/17/20 1818 11/17/20 2207 11/18/20 0345 11/18/20 0415 11/18/20 0838  GLUCAP 128* 97 51* 143* 78   Cardiac Enzymes: No results for input(s): CKTOTAL, CKMB, CKMBINDEX, TROPONINI in the last 168 hours. No results for input(s): PROBNP in the last 8760 hours. Coagulation Profile: No results for input(s): INR, PROTIME in the last 168 hours. Thyroid Function Tests: No results for input(s): TSH, T4TOTAL, FREET4, T3FREE, THYROIDAB in the last 72 hours. Lipid Profile: No results for input(s): CHOL, HDL, LDLCALC, TRIG, CHOLHDL, LDLDIRECT in the last 72 hours. Anemia Panel: No results for input(s): VITAMINB12, FOLATE, FERRITIN, TIBC, IRON, RETICCTPCT in the last 72 hours. Urine analysis:    Component Value Date/Time   COLORURINE YELLOW 11/12/2020 1832   APPEARANCEUR CLEAR 11/12/2020 1832   LABSPEC 1.015 11/12/2020 1832   PHURINE 5.0 11/12/2020 1832   GLUCOSEU NEGATIVE 11/12/2020 1832   HGBUR NEGATIVE 11/12/2020 1832   BILIRUBINUR NEGATIVE 11/12/2020 1832   KETONESUR NEGATIVE 11/12/2020 1832   PROTEINUR NEGATIVE 11/12/2020 1832   NITRITE NEGATIVE 11/12/2020 1832   LEUKOCYTESUR NEGATIVE 11/12/2020 1832   Sepsis Labs: Invalid input(s): PROCALCITONIN, [place]  Microbiology: Recent Results (from the past 240 hour(s))  Culture, blood (routine x 2)     Status:  None   Collection Time: 11/12/20  9:36 PM   Specimen: BLOOD  Result Value Ref Range Status   Specimen Description   Final    BLOOD Blood Culture adequate volume Performed at [facility], [address] [place], [place] [address]    Special Requests   Final    BOTTLES DRAWN AEROBIC AND ANAEROBIC RIGHT ANTECUBITAL Performed at [facility], [address] [place], [place] [address]    Culture   Final    NO GROWTH 5 DAYS Performed at [facility], [place] [address], [place], [place] [address]    Report Status 11/18/2020 FINAL  Final  SARS CORONAVIRUS 2 (TAT 6-24 HRS) Nasopharyngeal     Status: None   Collection Time: 11/12/20  9:44 PM   Specimen: Nasopharyngeal  Result Value Ref Range Status   SARS Coronavirus 2 NEGATIVE NEGATIVE Final    Comment: (NOTE) SARS-CoV-2 target nucleic acids are NOT DETECTED.  The SARS-CoV-2 RNA is generally detectable in upper and lower respiratory specimens during the acute phase of infection. Negative results do not preclude SARS-CoV-2 infection, do not rule out co-infections with other pathogens, and should not be used as the sole basis for treatment or other patient management decisions. Negative results must be combined with clinical observations, patient history, and epidemiological information. The expected result is Negative.  Fact Sheet for Patients: SugarRoll.be  Fact Sheet for  Healthcare Providers: https://www.woods-mathews.com/  This test is not yet approved or cleared by the [place] and  has been authorized for detection and/or diagnosis of SARS-CoV-2 by FDA under an Emergency Use Authorization (EUA). This EUA will remain  in effect (meaning this test can be used) for the duration of the COVID-19 declaration under Se ction 564(b)(1) of the Act, 21 U.S.C. section 360bbb-3(b)(1), unless the authorization is terminated or revoked sooner.  Performed at [facility], [place] [address], [place], [place] [address]   Culture, blood (routine x 2)     Status: None   Collection Time: 11/12/20 10:11 PM   Specimen: BLOOD  Result Value Ref Range Status   Specimen Description   Final    BLOOD BLOOD RIGHT HAND Performed at [facility] [address] [place], [place] [address]    Special Requests   Final    BOTTLES DRAWN AEROBIC AND ANAEROBIC Blood Culture adequate volume Performed at [facility], [address] [place], [place] [address]    Culture   Final    NO GROWTH 5 DAYS Performed at [facility], [place] [address] [place], [place] [address]    Report Status 11/18/2020 FINAL  Final  MRSA PCR Screening     Status: None   Collection Time: 11/16/20  3:03 PM   Specimen: Nasal Mucosa; Nasopharyngeal  Result Value Ref Range Status   MRSA by PCR NEGATIVE NEGATIVE Final    Comment:        The GeneXpert MRSA Assay (FDA approved for NASAL specimens only), is one component of a comprehensive MRSA colonization surveillance program. It is not intended to diagnose MRSA infection nor to guide or monitor treatment for MRSA infections. Performed at [facility], [place] [address] [place], [place] [address]   Surgical pcr screen     Status: None   Collection Time: 11/17/20 10:41 PM   Specimen: Nasal Mucosa; Nasal Swab  Result Value Ref Range Status   MRSA, PCR NEGATIVE NEGATIVE Final   Staphylococcus aureus NEGATIVE NEGATIVE Final    Comment: (NOTE) The Xpert SA Assay (FDA approved for NASAL specimens in patients 64 years of age and older), is one component of a comprehensive surveillance program. It is not intended to diagnose infection nor to guide or monitor treatment. Performed at [facility], [place] [address], [place], [place] [address]     Radiology Studies: No results found.  Jeremy Hickman  If 7PM-7AM, please contact night-coverage [email or web address] 11/18/2020, 10:55 AM

## 2020-11-18 NOTE — Progress Notes (Signed)
PT Cancellation Note  Patient Details Name: Jeremy Hickman MRN: [id number] DOB: [birth year]   Cancelled Treatment:    Reason Eval/Treat Not Completed: Patient at procedure or test/unavailable  Pt getting ready to leave for cardiac cath per RN. Will hold PT today. Abran Richard, PT Acute Rehab Services Pager [phone] [facility] 11/18/2020, 12:00 PM

## 2020-11-18 NOTE — Progress Notes (Addendum)
Progress Note  Patient Name: Jeremy Hickman Date of Encounter: 11/18/2020  [facility] Cardiologist: [facility] (Dr. Atilano Median)  Subjective   Restless sleep last night due to frequent interaction.  No chest pain or shortness of breath.  He thinks his edema and redness improving on his leg  Inpatient Medications    Scheduled Meds: . aspirin EC  81 mg Oral Daily  . atorvastatin  80 mg Oral Daily  . busPIRone  15 mg Oral QPM  . busPIRone  30 mg Oral Q0600  . carvedilol  12.5 mg Oral BID WC  . docusate sodium  100 mg Oral BID  . donepezil  5 mg Oral QHS  . ezetimibe  10 mg Oral Daily  . FLUoxetine  20 mg Oral Daily  . folic acid  1 mg Oral Daily  . guaiFENesin  600 mg Oral BID  . hydrocerin   Topical Daily  . insulin aspart  0-9 Units Subcutaneous Q4H  . linezolid  600 mg Oral Q12H  . loratadine  10 mg Oral Daily  . melatonin  3 mg Oral QHS  . metoCLOPramide (REGLAN) injection  5 mg Intravenous Q8H  . multivitamin with minerals  1 tablet Oral Daily  . nutrition supplement (JUVEN)  1 packet Oral BID BM  . pantoprazole  40 mg Oral Daily  . Ensure Max Protein  11 oz Oral QHS  . sodium chloride flush  3 mL Intravenous Q12H  . sodium chloride flush  3 mL Intravenous Q12H  . terazosin  5 mg Oral QHS  . thiamine  100 mg Oral Daily   Or  . thiamine  100 mg Intravenous Daily   Continuous Infusions: . sodium chloride 10 mL/hr at 11/14/20 0151  . sodium chloride 250 mL (11/18/20 0309)  . sodium chloride 10 mL/hr at 11/18/20 0553  . ampicillin-sulbactam (UNASYN) IV 3 g (11/18/20 0757)  . heparin 1,900 Units/hr (11/18/20 0309)   PRN Meds: sodium chloride, acetaminophen **OR** acetaminophen, alum & mag hydroxide-simeth, bisacodyl, diphenhydrAMINE, hydrALAZINE, HYDROcodone-acetaminophen, ipratropium-albuterol, morphine injection, nitroGLYCERIN, ondansetron **OR** ondansetron (ZOFRAN) IV, polyethylene glycol, sodium chloride flush, temazepam   Vital Signs    Vitals:   11/17/20  1948 11/18/20 0439 11/18/20 0500 11/18/20 0749  BP: (!) 153/78 122/62  (!) 154/83  Pulse: 79 67  74  Resp: 17 17  (!) 25  Temp: 97.9 F (36.6 C) 97.8 F (36.6 C)  (!) 97.3 F (36.3 C)  TempSrc: Oral Oral  Oral  SpO2: 96% 98%  99%  Weight:   108.9 kg   Height:        Intake/Output Summary (Last 24 hours) at 11/18/2020 0955 Last data filed at 11/18/2020 0600 Gross per 24 hour  Intake 1208.62 ml  Output 250 ml  Net 958.62 ml   Last 3 Weights 11/18/2020 11/17/2020 11/13/2020  Weight (lbs) 240 lb 1.3 oz 240 lb 1.3 oz 239 lb 10.2 oz  Weight (kg) 108.9 kg 108.9 kg 108.7 kg      Telemetry  Sinus rhythm, PVC- Personally Reviewed  ECG    No new tracing  Physical Exam   GEN: No acute distress.   Neck: No JVD Cardiac: RRR, no murmurs, rubs, or gallops.  Respiratory: Clear to auscultation bilaterally. GI: Soft, nontender, non-distended  MS: Right lower extremity with erythema and edema; No deformity. Neuro:  Nonfocal  Psych: Normal affect   Labs    High Sensitivity Troponin:   Recent Labs  Lab 11/14/20 1656 11/14/20 1916 11/15/20 0802  TROPONINIHS 6,603* 7,382* 4,065*      Chemistry Recent Labs  Lab 11/12/20 2144 11/14/20 0448 11/15/20 0253 11/16/20 5427 11/17/20 0249 11/18/20 0138  NA 126*   < > 130* 135 134* 137  K 4.2   < > 5.0 5.1 4.6 4.0  CL 93*   < > 95* 99 98 101  CO2 23   < > 21* 21* 26 24  GLUCOSE 139*   < > 324* 264* 188* 57*  BUN 30*   < > 28* 44* 57* 46*  CREATININE 1.11   < > 1.22 1.15 1.15 0.94  CALCIUM 8.7*   < > 8.7* 8.9 8.8* 9.1  PROT 6.7  --  5.9* 5.8*  --   --   ALBUMIN 3.2*  --  2.2* 2.2* 2.0* 2.2*  AST 77*  --  54* 32  --   --   ALT 82*  --  65* 69*  --   --   ALKPHOS 223*  --  244* 183*  --   --   BILITOT 0.3  --  1.0 1.0  --   --   GFRNONAA >60   < > >60 >60 >60 >60  ANIONGAP 10   < > 14 15 10 12    < > = values in this interval not displayed.     Hematology Recent Labs  Lab 11/16/20 0608 11/17/20 0249 11/18/20 0138  WBC 20.3*  21.9* 16.9*  RBC 3.61* 3.29* 3.54*  HGB 12.0* 10.8* 11.2*  HCT 34.1* 30.7* 33.8*  MCV 94.5 93.3 95.5  MCH 33.2 32.8 31.6  MCHC 35.2 35.2 33.1  RDW 14.0 14.1 13.8  PLT 362 379 409*    BNP Recent Labs  Lab 11/14/20 0448  BNP 859.3*     Radiology    No results found.  Cardiac Studies   Echo 11/14/20  1. Left ventricular ejection fraction, by estimation, is 40 to 45%. The  left ventricle has mildly decreased function. The left ventricle  demonstrates regional wall motion abnormalities (see scoring  diagram/findings for description). There is mild left  ventricular hypertrophy. Left ventricular diastolic parameters are  consistent with Grade I diastolic dysfunction (impaired relaxation).  Elevated left atrial pressure.  2. Right ventricular systolic function is normal. The right ventricular  size is normal.  3. The mitral valve is normal in structure. No evidence of mitral valve  regurgitation. No evidence of mitral stenosis.  4. The aortic valve is normal in structure. Aortic valve regurgitation is  not visualized. Mild to moderate aortic valve sclerosis/calcification is  present, without any evidence of aortic stenosis.  5. The inferior vena cava is normal in size with greater than 50%  respiratory variability, suggesting right atrial pressure of 3 mmHg.   Patient Profile     70 y.o. 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   Assessment & Plan     1. NSTEMI - High-sensitivity troponin 6,603>> 7382>>4065 - Echo showed LVEF of 40-45% with akinesis of inferior wall and posterior wall as well as grade 1 diastolic function.EF down from 50-55% from last Echo in 2020 at [facility] (did have hypokinesis ofinferolateral, inferoseptal, and inferior walls at that time). - Treated with IV Heparin -  Aspirin 81mg  daily.  -Iincreased coreg to 12.5 mg Bid for elevated BP  - Continue Lipitor 80mg  daily and continue Zetia .  -For LHC  today.   2. Acute on Chronic Combined CHF - BNP elevated in the 800's. -He receivedIV lasix 40 mg. I & O positive.  No clear evidence of CHF exacerbation except lower extremity edema mild from cellulitis. -Increase Coreg due to elevated blood pressure - Home Telmisartan and spironolactone was held on admission due to "borderline renal function."  Which now has normalized.  Resume post cath.  3. Hypertension - BP  elevated but  stable - Resume Telmisartan and Spironolactone post cath   4. Hyperlipidemia - On Pravastatin and Zetia at home. -Started Lipitor 80mg  daily here, high intensity statin, and continue Zetia.  5. Diabetes Mellitus - Management per primary team.  6. Right Lower Extremity Cellulitis  -Improving.  Antibiotic per primary team.  For questions or updates, please contact [place] Please consult [email or web address] for contact info under        SignedLeanor Kail, PA  11/18/2020, 9:55 AM    Personally seen and examined. Agree with above.  States that he is ready to get out of here.  Creatinine 0.94  Non-ST elevation myocardial infarction with moderately reduced ejection fraction of 40% Plan is for left heart catheterization today.  Blood cultures have been negative.  Leg cellulitis is gradually improving with IV antibiotics.    Candee Furbish, MD

## 2020-11-18 NOTE — Interval H&P Note (Signed)
Cath Lab Visit (complete for each Cath Lab visit)  Clinical Evaluation Leading to the Procedure:   ACS: Yes.    Non-ACS:    Anginal Classification: CCS III  Anti-ischemic medical therapy: Maximal Therapy (2 or more classes of medications)  Non-Invasive Test Results: No non-invasive testing performed  Prior CABG: No previous CABG      History and Physical Interval Note:  11/18/2020 12:13 PM  Jeremy Hickman  has presented today for surgery, with the diagnosis of nstemi.  The various methods of treatment have been discussed with the patient and family. After consideration of risks, benefits and other options for treatment, the patient has consented to  Procedure(s): LEFT HEART CATH AND CORONARY ANGIOGRAPHY (N/A) as a surgical intervention.  The patient's history has been reviewed, patient examined, no change in status, stable for surgery.  I have reviewed the patient's chart and labs.  Questions were answered to the patient's satisfaction.     Shelva Majestic

## 2020-11-18 NOTE — H&P (View-Only) (Signed)
Progress Note  Patient Name: Jeremy Hickman Date of Encounter: 11/18/2020  [facility] Cardiologist: [facility] (Dr. Atilano Median)  Subjective   Restless sleep last night due to frequent interaction.  No chest pain or shortness of breath.  He thinks his edema and redness improving on his leg  Inpatient Medications    Scheduled Meds: . aspirin EC  81 mg Oral Daily  . atorvastatin  80 mg Oral Daily  . busPIRone  15 mg Oral QPM  . busPIRone  30 mg Oral Q0600  . carvedilol  12.5 mg Oral BID WC  . docusate sodium  100 mg Oral BID  . donepezil  5 mg Oral QHS  . ezetimibe  10 mg Oral Daily  . FLUoxetine  20 mg Oral Daily  . folic acid  1 mg Oral Daily  . guaiFENesin  600 mg Oral BID  . hydrocerin   Topical Daily  . insulin aspart  0-9 Units Subcutaneous Q4H  . linezolid  600 mg Oral Q12H  . loratadine  10 mg Oral Daily  . melatonin  3 mg Oral QHS  . metoCLOPramide (REGLAN) injection  5 mg Intravenous Q8H  . multivitamin with minerals  1 tablet Oral Daily  . nutrition supplement (JUVEN)  1 packet Oral BID BM  . pantoprazole  40 mg Oral Daily  . Ensure Max Protein  11 oz Oral QHS  . sodium chloride flush  3 mL Intravenous Q12H  . sodium chloride flush  3 mL Intravenous Q12H  . terazosin  5 mg Oral QHS  . thiamine  100 mg Oral Daily   Or  . thiamine  100 mg Intravenous Daily   Continuous Infusions: . sodium chloride 10 mL/hr at 11/14/20 0151  . sodium chloride 250 mL (11/18/20 0309)  . sodium chloride 10 mL/hr at 11/18/20 0553  . ampicillin-sulbactam (UNASYN) IV 3 g (11/18/20 0757)  . heparin 1,900 Units/hr (11/18/20 0309)   PRN Meds: sodium chloride, acetaminophen **OR** acetaminophen, alum & mag hydroxide-simeth, bisacodyl, diphenhydrAMINE, hydrALAZINE, HYDROcodone-acetaminophen, ipratropium-albuterol, morphine injection, nitroGLYCERIN, ondansetron **OR** ondansetron (ZOFRAN) IV, polyethylene glycol, sodium chloride flush, temazepam   Vital Signs    Vitals:   11/17/20  1948 11/18/20 0439 11/18/20 0500 11/18/20 0749  BP: (!) 153/78 122/62  (!) 154/83  Pulse: 79 67  74  Resp: 17 17  (!) 25  Temp: 97.9 F (36.6 C) 97.8 F (36.6 C)  (!) 97.3 F (36.3 C)  TempSrc: Oral Oral  Oral  SpO2: 96% 98%  99%  Weight:   108.9 kg   Height:        Intake/Output Summary (Last 24 hours) at 11/18/2020 0955 Last data filed at 11/18/2020 0600 Gross per 24 hour  Intake 1208.62 ml  Output 250 ml  Net 958.62 ml   Last 3 Weights 11/18/2020 11/17/2020 11/13/2020  Weight (lbs) 240 lb 1.3 oz 240 lb 1.3 oz 239 lb 10.2 oz  Weight (kg) 108.9 kg 108.9 kg 108.7 kg      Telemetry  Sinus rhythm, PVC- Personally Reviewed  ECG    No new tracing  Physical Exam   GEN: No acute distress.   Neck: No JVD Cardiac: RRR, no murmurs, rubs, or gallops.  Respiratory: Clear to auscultation bilaterally. GI: Soft, nontender, non-distended  MS: Right lower extremity with erythema and edema; No deformity. Neuro:  Nonfocal  Psych: Normal affect   Labs    High Sensitivity Troponin:   Recent Labs  Lab 11/14/20 1656 11/14/20 1916 11/15/20 0802  TROPONINIHS 6,603* 7,382* 4,065*      Chemistry Recent Labs  Lab 11/12/20 2144 11/14/20 0448 11/15/20 0253 11/16/20 5188 11/17/20 0249 11/18/20 0138  NA 126*   < > 130* 135 134* 137  K 4.2   < > 5.0 5.1 4.6 4.0  CL 93*   < > 95* 99 98 101  CO2 23   < > 21* 21* 26 24  GLUCOSE 139*   < > 324* 264* 188* 57*  BUN 30*   < > 28* 44* 57* 46*  CREATININE 1.11   < > 1.22 1.15 1.15 0.94  CALCIUM 8.7*   < > 8.7* 8.9 8.8* 9.1  PROT 6.7  --  5.9* 5.8*  --   --   ALBUMIN 3.2*  --  2.2* 2.2* 2.0* 2.2*  AST 77*  --  54* 32  --   --   ALT 82*  --  65* 69*  --   --   ALKPHOS 223*  --  244* 183*  --   --   BILITOT 0.3  --  1.0 1.0  --   --   GFRNONAA >60   < > >60 >60 >60 >60  ANIONGAP 10   < > 14 15 10 12    < > = values in this interval not displayed.     Hematology Recent Labs  Lab 11/16/20 0608 11/17/20 0249 11/18/20 0138  WBC 20.3*  21.9* 16.9*  RBC 3.61* 3.29* 3.54*  HGB 12.0* 10.8* 11.2*  HCT 34.1* 30.7* 33.8*  MCV 94.5 93.3 95.5  MCH 33.2 32.8 31.6  MCHC 35.2 35.2 33.1  RDW 14.0 14.1 13.8  PLT 362 379 409*    BNP Recent Labs  Lab 11/14/20 0448  BNP 859.3*     Radiology    No results found.  Cardiac Studies   Echo 11/14/20  1. Left ventricular ejection fraction, by estimation, is 40 to 45%. The  left ventricle has mildly decreased function. The left ventricle  demonstrates regional wall motion abnormalities (see scoring  diagram/findings for description). There is mild left  ventricular hypertrophy. Left ventricular diastolic parameters are  consistent with Grade I diastolic dysfunction (impaired relaxation).  Elevated left atrial pressure.  2. Right ventricular systolic function is normal. The right ventricular  size is normal.  3. The mitral valve is normal in structure. No evidence of mitral valve  regurgitation. No evidence of mitral stenosis.  4. The aortic valve is normal in structure. Aortic valve regurgitation is  not visualized. Mild to moderate aortic valve sclerosis/calcification is  present, without any evidence of aortic stenosis.  5. The inferior vena cava is normal in size with greater than 50%  respiratory variability, suggesting right atrial pressure of 3 mmHg.   Patient Profile     70 y.o. 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   Assessment & Plan     1. NSTEMI - High-sensitivity troponin 6,603>> 7382>>4065 - Echo showed LVEF of 40-45% with akinesis of inferior wall and posterior wall as well as grade 1 diastolic function.EF down from 50-55% from last Echo in 2020 at [facility] (did have hypokinesis ofinferolateral, inferoseptal, and inferior walls at that time). - Treated with IV Heparin -  Aspirin 81mg  daily.  -Iincreased coreg to 12.5 mg Bid for elevated BP  - Continue Lipitor 80mg  daily and continue Zetia .  -For LHC  today.   2. Acute on Chronic Combined CHF - BNP elevated in the 800's. -He receivedIV lasix 40 mg. I & O positive.  No clear evidence of CHF exacerbation except lower extremity edema mild from cellulitis. -Increase Coreg due to elevated blood pressure - Home Telmisartan and spironolactone was held on admission due to "borderline renal function."  Which now has normalized.  Resume post cath.  3. Hypertension - BP  elevated but  stable - Resume Telmisartan and Spironolactone post cath   4. Hyperlipidemia - On Pravastatin and Zetia at home. -Started Lipitor 80mg  daily here, high intensity statin, and continue Zetia.  5. Diabetes Mellitus - Management per primary team.  6. Right Lower Extremity Cellulitis  -Improving.  Antibiotic per primary team.  For questions or updates, please contact [place] Please consult [email or web address] for contact info under        SignedLeanor Kail, PA  11/18/2020, 9:55 AM    Personally seen and examined. Agree with above.  States that he is ready to get out of here.  Creatinine 0.94  Non-ST elevation myocardial infarction with moderately reduced ejection fraction of 40% Plan is for left heart catheterization today.  Blood cultures have been negative.  Leg cellulitis is gradually improving with IV antibiotics.    Candee Furbish, MD

## 2020-11-18 NOTE — Progress Notes (Signed)
Pt not in need of BiPAP at this time. Respiratory status stable on  2 Lpm w/no distress noted at this time. RT will continue to monitor.

## 2020-11-18 NOTE — Progress Notes (Signed)
Checked lab values and noticed pt.'s glucose was 57 followed up with POCT with value of 51. Ordered protocol for NPO hypoglycemia with 25 mg of dextrose IV. Rechecked glucose POCT with a level of 143. Pt. Is resting comfortably and is sleeping.

## 2020-11-19 ENCOUNTER — Inpatient Hospital Stay (HOSPITAL_COMMUNITY): Payer: HMO

## 2020-11-19 DIAGNOSIS — Z01810 Encounter for preprocedural cardiovascular examination: Secondary | ICD-10-CM | POA: Diagnosis not present

## 2020-11-19 DIAGNOSIS — I25110 Atherosclerotic heart disease of native coronary artery with unstable angina pectoris: Secondary | ICD-10-CM

## 2020-11-19 DIAGNOSIS — E1065 Type 1 diabetes mellitus with hyperglycemia: Secondary | ICD-10-CM | POA: Diagnosis not present

## 2020-11-19 DIAGNOSIS — E119 Type 2 diabetes mellitus without complications: Secondary | ICD-10-CM

## 2020-11-19 DIAGNOSIS — L03115 Cellulitis of right lower limb: Secondary | ICD-10-CM | POA: Diagnosis not present

## 2020-11-19 DIAGNOSIS — I10 Essential (primary) hypertension: Secondary | ICD-10-CM | POA: Diagnosis not present

## 2020-11-19 DIAGNOSIS — I214 Non-ST elevation (NSTEMI) myocardial infarction: Secondary | ICD-10-CM

## 2020-11-19 DIAGNOSIS — L0390 Cellulitis, unspecified: Secondary | ICD-10-CM | POA: Diagnosis not present

## 2020-11-19 LAB — CBC
HCT: 30.1 % — ABNORMAL LOW (ref 39.0–52.0)
Hemoglobin: 10.5 g/dL — ABNORMAL LOW (ref 13.0–17.0)
MCH: 32.9 pg (ref 26.0–34.0)
MCHC: 34.9 g/dL (ref 30.0–36.0)
MCV: 94.4 fL (ref 80.0–100.0)
Platelets: 400 10*3/uL (ref 150–400)
RBC: 3.19 MIL/uL — ABNORMAL LOW (ref 4.22–5.81)
RDW: 13.5 % (ref 11.5–15.5)
WBC: 14.2 10*3/uL — ABNORMAL HIGH (ref 4.0–10.5)
nRBC: 0 % (ref 0.0–0.2)

## 2020-11-19 LAB — BASIC METABOLIC PANEL
Anion gap: 11 (ref 5–15)
BUN: 41 mg/dL — ABNORMAL HIGH (ref 8–23)
CO2: 26 mmol/L (ref 22–32)
Calcium: 8.7 mg/dL — ABNORMAL LOW (ref 8.9–10.3)
Chloride: 99 mmol/L (ref 98–111)
Creatinine, Ser: 1.17 mg/dL (ref 0.61–1.24)
GFR, Estimated: >60 mL/min (ref >60)
Glucose, Bld: 182 mg/dL — ABNORMAL HIGH (ref 70–99)
Potassium: 4.3 mmol/L (ref 3.5–5.1)
Sodium: 136 mmol/L (ref 135–145)

## 2020-11-19 LAB — PULMONARY FUNCTION TEST
FEF 25-75 Pre: 0.75 L/sec
FEF2575-%Pred-Pre: 33 %
FEV1-%Pred-Pre: 42 %
FEV1-Pre: 1.27 L
FEV1FVC-%Pred-Pre: 89 %
FEV6-%Pred-Pre: 50 %
FEV6-Pre: 1.94 L
FEV6FVC-%Pred-Pre: 106 %
FVC-%Pred-Pre: 47 %
FVC-Pre: 1.94 L
Pre FEV1/FVC ratio: 66 %
Pre FEV6/FVC Ratio: 100 %

## 2020-11-19 LAB — GLUCOSE, CAPILLARY
Glucose-Capillary: 145 mg/dL — ABNORMAL HIGH (ref 70–99)
Glucose-Capillary: 172 mg/dL — ABNORMAL HIGH (ref 70–99)
Glucose-Capillary: 176 mg/dL — ABNORMAL HIGH (ref 70–99)
Glucose-Capillary: 200 mg/dL — ABNORMAL HIGH (ref 70–99)
Glucose-Capillary: 255 mg/dL — ABNORMAL HIGH (ref 70–99)
Glucose-Capillary: 257 mg/dL — ABNORMAL HIGH (ref 70–99)

## 2020-11-19 LAB — HEPARIN LEVEL (UNFRACTIONATED): Heparin Unfractionated: 0.38 IU/mL (ref 0.30–0.70)

## 2020-11-19 MED ORDER — IRBESARTAN 150 MG PO TABS
300.0000 mg | ORAL_TABLET | Freq: Every day | ORAL | Status: DC
  Administered 2020-11-19 – 2020-11-20 (×2): 300 mg via ORAL
  Filled 2020-11-19 (×2): qty 2

## 2020-11-19 MED ORDER — INSULIN GLARGINE 100 UNIT/ML ~~LOC~~ SOLN
15.0000 [IU] | Freq: Every day | SUBCUTANEOUS | Status: DC
  Administered 2020-11-19 – 2020-11-20 (×2): 15 [IU] via SUBCUTANEOUS
  Filled 2020-11-19 (×3): qty 0.15

## 2020-11-19 MED ORDER — INSULIN ASPART 100 UNIT/ML ~~LOC~~ SOLN
0.0000 [IU] | Freq: Every day | SUBCUTANEOUS | Status: DC
  Administered 2020-11-20: 4 [IU] via SUBCUTANEOUS

## 2020-11-19 MED ORDER — ORAL CARE MOUTH RINSE
15.0000 mL | Freq: Two times a day (BID) | OROMUCOSAL | Status: DC
  Administered 2020-11-19 – 2020-11-20 (×2): 15 mL via OROMUCOSAL

## 2020-11-19 MED ORDER — INSULIN ASPART 100 UNIT/ML ~~LOC~~ SOLN
0.0000 [IU] | Freq: Three times a day (TID) | SUBCUTANEOUS | Status: DC
  Administered 2020-11-19: 8 [IU] via SUBCUTANEOUS
  Administered 2020-11-19 – 2020-11-20 (×2): 3 [IU] via SUBCUTANEOUS
  Administered 2020-11-20: 5 [IU] via SUBCUTANEOUS
  Administered 2020-11-20: 8 [IU] via SUBCUTANEOUS
  Administered 2020-11-21: 3 [IU] via SUBCUTANEOUS

## 2020-11-19 MED ORDER — INSULIN ASPART 100 UNIT/ML ~~LOC~~ SOLN
4.0000 [IU] | Freq: Three times a day (TID) | SUBCUTANEOUS | Status: DC
  Administered 2020-11-19 – 2020-11-20 (×5): 4 [IU] via SUBCUTANEOUS

## 2020-11-19 MED ORDER — SPIRONOLACTONE 12.5 MG HALF TABLET
12.5000 mg | ORAL_TABLET | Freq: Every day | ORAL | Status: DC
  Administered 2020-11-19 – 2020-11-20 (×2): 12.5 mg via ORAL
  Filled 2020-11-19 (×2): qty 1

## 2020-11-19 MED FILL — Ipratropium-Albuterol Nebu Soln 0.5-2.5(3) MG/3ML: RESPIRATORY_TRACT | Qty: 3 | Status: AC

## 2020-11-19 NOTE — Consult Note (Signed)
[name] [address]             [phone]        Lovelace E Dedman [facility] Medical Record #[id number] Date of Birth: [birth year]  Referring: Candee Furbish, [name], [name], [name], [name], hypertension, obesity, dyslipidemia, [name], [name], [name], redness, and swelling in his right lower extremity and sought medical care in the emergency room at [facility] on 11/10/2020.  He underwent lower extremity ultrasound that ruled out DVT.  He was treated empirically with oral antibiotics but did not see any improvement.  For this reason, [name], decision was made to admit [name] to the hospital for IV antibiotics for cellulitis.  He was admitted on 11/13/2020.  Blood cultures were obtained that were negative.  The antibiotics were initiated.  On the following day, 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, [name],000 at the time of admission to 14,[name], [name], [name], unable to do work activities, [name], [name], no self care, confined to bed or chair []     5    Moribund  Past Medical History:  Diagnosis Date  . Cellulitis and abscess of right leg 11/13/2020  . Class 2 obesity due to excess calories with body mass index (BMI) of 36.0 to 36.9 in adult 11/13/2020  . Dementia ([place])   . Diabetes mellitus without complication ([place])   . Hypertension     Past Surgical History:  Procedure Laterality Date  . CATARACT EXTRACTION    . COLONOSCOPY    . EYE SURGERY    . HEMORROIDECTOMY    . RIGHT/LEFT HEART CATH AND CORONARY ANGIOGRAPHY N/A 11/18/2020   Procedure: RIGHT/LEFT HEART CATH AND CORONARY  ANGIOGRAPHY;  [name], MD;  [name];  Service: Cardiovascular;  [name];  [name], MD 10 mL/hr at 11/14/20 0151 Infusion Verify at 11/14/20 0151  . 0.9 %  sodium chloride infusion  250 mL Intravenous PRN Troy Sine, [name], MD   650 mg at 11/19/20 1208   Or  . acetaminophen (TYLENOL) suppository 650 mg  650 mg Rectal Q6H PRN Karmen Bongo, [name], MD   650 mg at 11/18/20 2218  . alum & mag hydroxide-simeth (MAALOX/MYLANTA) 200-200-20 MG/5ML suspension 30 mL  30 mL Oral Q6H PRN Gonfa, [name], MD   30 mL at 11/16/20 1056  . Ampicillin-Sulbactam (UNASYN) 3 g in sodium chloride 0.9 % 100 mL IVPB  3 g Intravenous Q6H Pham, [name], RPH-CPP 200 mL/hr at 11/19/20 0828 3 g at 11/19/20 0828  . aspirin chewable tablet 81 mg  81 mg Oral Daily Troy Sine, MD   81 mg at 11/19/20 1000  . atorvastatin (LIPITOR) tablet 80 mg  80 mg Oral Daily Troy Sine, MD   80 mg at 11/19/20 1000  . bisacodyl (DULCOLAX) EC tablet 5 mg  5 mg Oral Daily PRN Karmen Bongo, [name], MD   15 mg at 11/18/20 1802   . busPIRone (BUSPAR) tablet 30 mg  30 mg Oral Q0600 Karmen Bongo, MD   30 mg at 11/19/20 1000  . carvedilol (COREG) tablet 12.5 mg  12.5 mg Oral BID WC Cherlynn Kaiser A, MD   12.5 mg at 11/19/20 7867  . diphenhydrAMINE (BENADRYL)  capsule 25 mg  25 mg Oral Q8H PRN Wendee Beavers T, MD   25 mg at 11/16/20 1603  . docusate sodium (COLACE) capsule 100 mg  100 mg Oral BID Karmen Bongo, MD   100 mg at 11/19/20 0959  . donepezil (ARICEPT) tablet 5 mg  5 mg Oral QHS Karmen Bongo, MD   5 mg at 11/18/20 2143  . ezetimibe (ZETIA) tablet 10 mg  10 mg Oral Daily Karmen Bongo, MD   10 mg at 11/19/20 0959  . FLUoxetine (PROZAC) capsule 20 mg  20 mg Oral Daily Karmen Bongo, MD   20 mg at 11/19/20 1000  . folic acid (FOLVITE) tablet 1 mg  1 mg Oral Daily Gonfa, [name], MD   1 mg at 11/19/20 1000  . furosemide (LASIX) injection 40 mg  40 mg Intravenous BID Jerline Pain, MD   40 mg at 11/19/20 0823  . guaiFENesin (MUCINEX) 12 hr tablet 600 mg  600 mg Oral BID Wendee Beavers T, MD   600 mg at 11/19/20 0959  . heparin ADULT infusion 100 units/mL (25000 units/232m)  1,900 Units/hr Intravenous Continuous GWendee BeaversT, MD 19 mL/hr at 11/19/20 1201 1,900 Units/hr at 11/19/20 1201  . hydrALAZINE (APRESOLINE) injection 5 mg  5 mg Intravenous Q4H PRN YKarmen Bongo MD      . hydrocerin (EUCERIN) cream   Topical Daily GMercy Riding MD   Given at 11/18/20 1021  . HYDROcodone-acetaminophen (NORCO/VICODIN) 5-325 MG per tablet 1-2 tablet  1-2 tablet Oral Q4H PRN YKarmen Bongo MD   2 tablet at 11/13/20 1331  . insulin aspart (novoLOG) injection 0-15 Units  0-15 Units Subcutaneous TID WC GMercy Riding MD   8 Units at 11/19/20 1153  . insulin aspart (novoLOG) injection 0-5 Units  0-5 Units Subcutaneous QHS Gonfa, [name], MD      . insulin aspart (novoLOG) injection 4 Units  4 Units Subcutaneous TID WC Gonfa, [name], MD      . insulin glargine (LANTUS) injection 15 Units  15 Units Subcutaneous Daily GMercy Riding MD   15 Units at 11/19/20 1152  . ipratropium-albuterol (DUONEB) 0.5-2.5 (3) MG/3ML nebulizer solution 3 mL  3 mL Nebulization Q4H PRN GWendee BeaversT, MD   3 mL at 11/18/20 1353  . irbesartan (AVAPRO) tablet 300 mg  300 mg Oral Daily SJerline Pain MD   300 mg at 11/19/20 1009  . linezolid (ZYVOX) tablet 600 mg  600 mg Oral Q12H Pham, [name], RPH-CPP   600 mg at 11/19/20 1000  . loratadine (CLARITIN) tablet 10 mg  10 mg Oral Daily YKarmen Bongo MD   10 mg at 11/19/20 1011  . MEDLINE mouth rinse  15 mL Mouth Rinse BID GWendee BeaversT, MD      . melatonin tablet 3 mg  3 mg Oral QHS YKarmen Bongo MD   3 mg at 11/18/20 2144  . metoCLOPramide (REGLAN) injection 5 mg  5 mg Intravenous Q8H GWendee BeaversT, MD   5 mg at 11/19/20 0617  . morphine 2 MG/ML injection 2 mg  2 mg Intravenous Q2H PRN YKarmen Bongo MD   2 mg at 11/13/20 2328  . multivitamin with minerals tablet 1 tablet  1 tablet Oral Daily YKarmen Bongo MD   1 tablet at 11/19/20 0959  . nitroGLYCERIN (NITROSTAT) SL tablet 0.4 mg  0.4 mg Sublingual Q5 min PRN GMercy Riding MD      . nitroGLYCERIN 50  mg in dextrose 5 % 250 mL (0.2 mg/mL) infusion  0-200 mcg/min Intravenous Titrated Troy Sine, MD 4.5 mL/hr at 11/19/20 1211 15 mcg/min at 11/19/20 1211  . nutrition supplement (JUVEN) (JUVEN) powder packet 1 packet  1 packet Oral BID BM Karmen Bongo, MD   1 packet at 11/19/20 [phone]  . ondansetron (ZOFRAN) tablet 4 mg  4 mg Oral Q6H PRN Karmen Bongo, MD       Or  . ondansetron [facility]) injection 4 mg  4 mg Intravenous Q6H PRN Karmen Bongo, MD   4 mg at 11/14/20 2149  . pantoprazole (PROTONIX) EC tablet 40 mg  40 mg Oral Daily Wendee Beavers T, MD   40 mg at 11/19/20 1000  . polyethylene glycol (MIRALAX / GLYCOLAX) packet 17 g  17 g Oral Daily PRN Karmen Bongo, MD      . potassium chloride SA (KLOR-CON) CR tablet 40 mEq  40 mEq Oral Daily Jerline Pain, MD   40 mEq at 11/19/20 0959  . protein supplement (ENSURE MAX) liquid  11  oz Oral QHS Karmen Bongo, MD      . sodium chloride flush (NS) 0.9 % injection 3 mL  3 mL Intravenous Q12H Troy Sine, MD   3 mL at 11/18/20 1804  . sodium chloride flush (NS) 0.9 % injection 3 mL  3 mL Intravenous PRN Troy Sine, MD      . spironolactone (ALDACTONE) tablet 12.5 mg  12.5 mg Oral Daily Jerline Pain, MD   12.5 mg at 11/19/20 1009  . temazepam (RESTORIL) capsule 15 mg  15 mg Oral QHS PRN Karmen Bongo, MD   15 mg at 11/15/20 0034  . terazosin (HYTRIN) capsule 5 mg  5 mg Oral QHS Karmen Bongo, MD   5 mg at 11/18/20 2219  . thiamine tablet 100 mg  100 mg Oral Daily Gonfa, [name], MD   100 mg at 11/19/20 1000   Or  . thiamine (B-1) injection 100 mg  100 mg Intravenous Daily Wendee Beavers T, MD   100 mg at 11/18/20 1020    Medications Prior to Admission  Medication Sig Dispense Refill Last Dose  . aspirin EC 81 MG tablet Take by mouth.   11/12/2020  . busPIRone (BUSPAR) 15 MG tablet Take 15-30 mg by mouth See admin instructions. Taking 30 mg in the AM and 15 mg in the evening.   11/12/2020 at Unknown time  . carvedilol (COREG) 6.25 MG tablet Take 6.25 mg by mouth 2 (two) times daily with a meal.   11/12/2020 at 0700  . cetirizine (ZYRTEC) 10 MG tablet Take 10 mg by mouth daily.    11/12/2020 at Unknown time  . diphenhydrAMINE (BENADRYL) 25 mg capsule Take 25 mg by mouth every 6 (six) hours as needed for allergies.   Past Week at Unknown time  . donepezil (ARICEPT) 5 MG tablet Take 5 mg by mouth at bedtime.   11/11/2020  . ezetimibe (ZETIA) 10 MG tablet Take 10 mg by mouth daily.   11/12/2020 at Unknown time  . Flax Oil-Fish Oil-Borage Oil CAPS Take 1,000 capsules by mouth daily.    11/12/2020 at Unknown time  . FLUoxetine (PROZAC) 40 MG capsule Take 40 mg by mouth daily.   11/12/2020 at Unknown time  . furosemide (LASIX) 20 MG tablet Take 20-40 mg by mouth See admin instructions. Takes 84m in the morning and 269min the afternoon   11/12/2020 at Unknown time  .  insulin glargine  (LANTUS) 100 UNIT/ML injection Inject 42 Units into the skin at bedtime.   11/11/2020  . ketoconazole (NIZORAL) 2 % shampoo Apply 1 application topically 2 (two) times a week.   Past Week at Unknown time  . Melatonin 3 MG CAPS Take 3 mg by mouth at bedtime.   11/11/2020  . Multiple Vitamin (MULTI-VITAMINS) TABS Take 1 tablet by mouth daily.    11/12/2020 at Unknown time  . NOVOLOG FLEXPEN RELION 100 UNIT/ML FlexPen Inject 10-25 Units into the skin 3 (three) times daily before meals.   11/11/2020  . telmisartan (MICARDIS) 80 MG tablet Take 80 mg by mouth daily.   11/12/2020 at Unknown time  . temazepam (RESTORIL) 15 MG capsule Take 15 mg by mouth at bedtime as needed for sleep.   11/11/2020  . terazosin (HYTRIN) 5 MG capsule Take 5 mg by mouth at bedtime.   11/11/2020  . Blood Glucose Monitoring Suppl (FIFTY50 GLUCOSE METER 2.0) w/Device KIT Use as instructed     . fenofibrate micronized (ANTARA) 130 MG capsule Take 1 tablet daily (Patient not taking: No sig reported)   Not Taking at Unknown time  . traZODone (DESYREL) 50 MG tablet Take 50 mg by mouth at bedtime. (Patient not taking: No sig reported)   Not Taking at Unknown time    Family History  Problem Relation Age of Onset  . Cancer Mother        lunger cancer  . Cancer Brother   . Heart disease Neg Hx   . Stroke Neg Hx      Review of Systems:   ROS     Cardiac Review of Systems: Y or  [    ]= no  Chest Pain [    ]  Resting SOB [ x ] Exertional SOB  [  ]  Orthopnea [  ]   Pedal Edema [ x  ]    Palpitations [  ] Syncope  [  ]   Presyncope [   ]  General Review of Systems: [Y] = yes [  ]=no Constitional: recent weight change [  ]; anorexia [  ]; fatigue [  ]; nausea [  ]; night sweats [  ]; fever [  ]; or chills [  ]                                                               Dental: Last Dentist visit: Can't remember.  Eye : blurred vision [  ]; diplopia [   ]; vision changes [  ];  Amaurosis fugax[  ]; Resp: cough [  ];  wheezing[ x ];   hemoptysis[  ]; shortness of breath[ x ]; paroxysmal nocturnal dyspnea[  ]; dyspnea on exertion[  ]; or orthopnea[  ];  GI:  gallstones[  ], vomiting[  ];  dysphagia[  ]; melena[  ];  hematochezia [  ]; heartburn[  ];   Hx of  Colonoscopy[  ]; GU: kidney stones [  ]; hematuria[  ];   dysuria [  ];  nocturia[  ];  history of     obstruction [  ]; urinary frequency [  ]             Skin: rash, swelling[  ];, hair  loss[  ];  peripheral edema[  ];  or itching[  ]; Musculosketetal: myalgias[  ];  joint swelling[  ];  joint erythema[  ];  joint pain[  ];  back pain[  ];  Heme/Lymph: bruising[  ];  bleeding[  ];  anemia[  ];  Neuro: TIA[  ];  headaches[  ];  stroke[  ];  vertigo[  ];  seizures[  ];   paresthesias[  ];  difficulty walking[  ];  Psych:depression[  ]; anxiety[  ];  Endocrine: diabetes[ x ];  thyroid dysfunction[  ];              Physical Exam: BP 130/67 (BP Location: Right Arm)   Pulse 71   Temp 97.9 F (36.6 C) (Oral)   Resp 14   Ht 5' 8"  (1.727 m)   Wt 106.7 kg   SpO2 94%   BMI 35.77 kg/m    General appearance: alert, cooperative and no distress Head: Normocephalic, without obvious abnormality, atraumatic Neck: no adenopathy, no carotid bruit, no JVD, supple, symmetrical, trachea midline and thyroid not enlarged, symmetric, no tenderness/mass/nodules Resp: Few wheezes present. Cardio: regular rate and rhythm, S1, S2 normal, no murmur, click, rub or gallop GI: soft, non-tender; bowel sounds normal; no masses,  no organomegaly Extremities: Warm and well-perfused with no obvious deformity. Modified Allen's test was performed on both upper extremities utilizing a Doppler probe applied to each time. This demonstrated excellent Doppler waveforms bilaterally while each radial artery was occluded. He has 1+ edema in the left foot and 2-3+ edema in the right foot. There is circumferential erythema extending from the knee down to the ankle on the right side. This is not particularly  tender. Neurologic: Alert and oriented X 3, normal strength.  Diagnostic Studies & Laboratory data:  RIGHT/LEFT HEART CATH AND CORONARY ANGIOGRAPHY    Conclusion    Prox RCA to Mid RCA lesion is 90% stenosed.  Prox RCA lesion is 80% stenosed.  Prox LAD lesion is 95% stenosed.  Mid Cx lesion is 95% stenosed.  Prox Cx lesion is 60% stenosed with 60% stenosed side branch in 1st Mrg.  Mid RCA to Dist RCA lesion is 100% stenosed.  Mid LAD lesion is 80% stenosed.  Mid LAD to Dist LAD lesion is 80% stenosed.   Severe multivessel CAD with evidence for coronary calcification.    The LAD has 95% very proximal stenosis followed by diffuse 80% proximal- mid to mid stenoses;  The circumflex vessel has 60% stenosis proximal to a large first marginal branch with 50% mid marginal stenosis and 95% AV groove stenosis.  The RCA has diffuse 80% proximal stenosis followed by 90% mid stenosis before being totally occluded proximal to the acute margin.  There are are right to right collaterals supplying a portion of the distal RCA.  There are more extensive left to right collaterals from the circumflex and LAD supplying the PDA and PLA vessels.  Elevated right heart pressures moderate pulmonary hypertension with PA mean pressure 36 mm.  RECOMMENDATION: Patient has significant multivessel CAD and will ultimately need surgical consultation for CABG revascularization.  Plan to continue aggressive diuresis with volume overload, treatment for his cellulitis.  Will initiate heparin and IC nitroglycerin.  Aggressive lipid-lowering therapy with target LDL less than 70 and optimal blood pressure control.  Radiation/Fluoro  Fluoro time: 8.5 (min) DAP: 41006 (mGycm2) Cumulative Air Kerma: 704 (mGy)  Coronary Findings   Diagnostic Dominance: Right  Left Anterior Descending  Prox LAD lesion  is 95% stenosed.  Mid LAD lesion is 80% stenosed.  Mid LAD to Dist LAD lesion is 80% stenosed.  Left  Circumflex  Prox Cx lesion is 60% stenosed with 60% stenosed side branch in 1st Mrg.  Mid Cx lesion is 95% stenosed.  Second Obtuse Marginal Branch  Vessel is small in size.  Right Coronary Artery  Prox RCA lesion is 80% stenosed.  Prox RCA to Mid RCA lesion is 90% stenosed.  Mid RCA to Dist RCA lesion is 100% stenosed.  Right Posterior Descending Artery  Collaterals  RPDA filled by collaterals from Lat 1st Mrg.    Right Posterior Atrioventricular Artery  Collaterals  RPAV filled by collaterals from Ost RCA.    Third Right Posterolateral Branch  Collaterals  3rd RPL filled by collaterals from Lat 1st Mrg.     Intervention   No interventions have been documented.  Right Heart  Right Heart Pressures RA: A-wave 16, V wave 15; mean 15 RV: 54/19 PA: 52/20; mean 36 PW A-wave 30, V wave 30 mean 27  Initial AO 140/74 Initial LV 150/50  Pullback: Following good diuresis with urine output 650 cc LV: 140/39 Ao: 147/78  Oxygen saturation the pulmonary artery 69% and in the aorta at 99%. By the Fick method, cardiac output 6.4 L/min and cardiac index 2.9 L/min/m.   PVR: 1.4 WU   Coronary Diagrams   Diagnostic Dominance: Right       ECHOCARDIOGRAM REPORT       Patient Name:  SAMYAK SACKMANN Date of Exam: 11/14/2020  Medical Rec #: [id number]    Height:    68.0 in  Accession #:  [id number]   Weight:    239.6 lb  Date of Birth: [birth year]    BSA:     2.207 m  Patient Age:  31 years    BP:      163/89 mmHg  Patient Gender: M        HR:      91 bpm.  Exam Location: Inpatient   Procedure: 2D Echo, Color Doppler and Cardiac Doppler   Indications:  Dyspnea R06.00    History:    Patient has no prior history of Echocardiogram  examinations.         Risk Factors:Hypertension and Diabetes. Cellulitis.    Sonographer:  Darlina Sicilian RDCS  Referring Phys: [id number] Charlesetta Ivory GONFA   IMPRESSIONS     1. Left ventricular ejection fraction, by estimation, is 40 to 45%. The  left ventricle has mildly decreased function. The left ventricle  demonstrates regional wall motion abnormalities (see scoring  diagram/findings for description). There is mild left  ventricular hypertrophy. Left ventricular diastolic parameters are  consistent with Grade I diastolic dysfunction (impaired relaxation).  Elevated left atrial pressure.  2. Right ventricular systolic function is normal. The right ventricular  size is normal.  3. The mitral valve is normal in structure. No evidence of mitral valve  regurgitation. No evidence of mitral stenosis.  4. The aortic valve is normal in structure. Aortic valve regurgitation is  not visualized. Mild to moderate aortic valve sclerosis/calcification is  present, without any evidence of aortic stenosis.  5. The inferior vena cava is normal in size with greater than 50%  respiratory variability, suggesting right atrial pressure of 3 mmHg.   FINDINGS  Left Ventricle: Left ventricular ejection fraction, by estimation, is 40  to 45%. The left ventricle has mildly decreased function. The left  ventricle demonstrates regional wall motion abnormalities. The  left  ventricular internal cavity size was normal  in size. There is mild left ventricular hypertrophy. Left ventricular  diastolic parameters are consistent with Grade I diastolic dysfunction  (impaired relaxation). Elevated left atrial pressure.     LV Wall Scoring:  The inferior wall and posterior wall are akinetic.   Right Ventricle: The right ventricular size is normal. No increase in  right ventricular wall thickness. Right ventricular systolic function is  normal.   Left Atrium: Left atrial size was normal in size.   Right Atrium: Right atrial size was normal in size.   Pericardium: There is no evidence of pericardial effusion.   Mitral Valve: The mitral valve is normal in structure. No  evidence of  mitral valve regurgitation. No evidence of mitral valve stenosis.   Tricuspid Valve: The tricuspid valve is normal in structure. Tricuspid  valve regurgitation is not demonstrated. No evidence of tricuspid  stenosis.   Aortic Valve: The aortic valve is normal in structure. Aortic valve  regurgitation is not visualized. Mild to moderate aortic valve  sclerosis/calcification is present, without any evidence of aortic  stenosis.   Pulmonic Valve: The pulmonic valve was normal in structure. Pulmonic valve  regurgitation is not visualized. No evidence of pulmonic stenosis.   Aorta: The aortic root is normal in size and structure.   Venous: The inferior vena cava is normal in size with greater than 50%  respiratory variability, suggesting right atrial pressure of 3 mmHg.   IAS/Shunts: No atrial level shunt detected by color flow Doppler.     LEFT VENTRICLE  PLAX 2D  LVIDd:     4.10 cm Diastology  LVIDs:     3.40 cm LV e' medial:  3.92 cm/s  LV PW:     1.20 cm LV E/e' medial: 25.0  LV IVS:    1.40 cm LV e' lateral:  5.00 cm/s  LVOT diam:   2.10 cm LV E/e' lateral: 19.6  LV SV:     63  LV SV Index:  29  LVOT Area:   3.46 cm     RIGHT VENTRICLE  RV S prime:   14.50 cm/s  TAPSE (M-mode): 1.6 cm   LEFT ATRIUM       Index  LA diam:    3.00 cm 1.36 cm/m  LA Vol (A2C):  49.0 ml 22.20 ml/m  LA Vol (A4C):  55.3 ml 25.06 ml/m  LA Biplane Vol: 55.5 ml 25.15 ml/m  AORTIC VALVE  LVOT Vmax:  96.80 cm/s  LVOT Vmean: 66.000 cm/s  LVOT VTI:  0.183 m    AORTA  Ao Root diam: 3.00 cm   MITRAL VALVE  MV Area (PHT): 6.32 cm   SHUNTS  MV Decel Time: 120 msec   Systemic VTI: 0.18 m  MV E velocity: 98.00 cm/s  Systemic Diam: 2.10 cm  MV A velocity: 108.00 cm/s  MV E/A ratio: 0.91   Candee Furbish MD  Electronically signed by Candee Furbish MD  Signature Date/Time: 11/14/2020/2:29:24 PM        Pulmonary  Function Test  Results for CARMON, SAHLI (MRN [id number]) as of 11/19/2020 13:54  Ref. Range 11/19/2020 10:31  FVC-Pre Latest Units: L 1.94  FVC-%Pred-Pre Latest Units: % 47  FEV1-Pre Latest Units: L 1.27  FEV1-%Pred-Pre Latest Units: % 42  Pre FEV1/FVC ratio Latest Units: % 66  FEV1FVC-%Pred-Pre Latest Units: % 89  FEF 25-75 Pre Latest Units: L/sec 0.75  FEF2575-%Pred-Pre Latest Units: % 33  FEV6-Pre Latest Units: L  1.94  FEV6-%Pred-Pre Latest Units: % 50  Pre FEV6/FVC Ratio Latest Units: % 100  FEV6FVC-%Pred-Pre Latest Units: % 106    Recent Radiology Findings:   CLINICAL DATA:  Shortness of breath  EXAM: PORTABLE CHEST 1 VIEW  COMPARISON:  11/14/2020  FINDINGS: Cardiomegaly, vascular congestion. Patchy opacity in the left upper lobe is similar to prior study, new since 11/12/2020 concerning for infiltrate/pneumonia. Bibasilar atelectasis or infiltrates. No effusions or acute bony abnormality.  IMPRESSION: Cardiomegaly, vascular congestion.  Patchy left upper lobe and bibasilar opacities could reflect infiltrate/pneumonia.   Electronically Signed   By: Rolm Baptise M.D.   On: 11/15/2020 21:00   Recent Lab Findings: Lab Results  Component Value Date   WBC 14.2 (H) 11/19/2020   HGB 10.5 (L) 11/19/2020   HCT 30.1 (L) 11/19/2020   PLT 400 11/19/2020   GLUCOSE 182 (H) 11/19/2020   CHOL 198 11/15/2020   TRIG 433 (H) 11/15/2020   HDL 11 (L) 11/15/2020   LDLDIRECT 64.5 11/15/2020   LDLCALC UNABLE TO CALCULATE IF TRIGLYCERIDE OVER 400 mg/dL 11/15/2020   ALT 69 (H) 11/16/2020   AST 32 11/16/2020   NA 136 11/19/2020   K 4.3 11/19/2020   CL 99 11/19/2020   CREATININE 1.17 11/19/2020   BUN 41 (H) 11/19/2020   CO2 26 11/19/2020   TSH 1.931 11/13/2020   HGBA1C 6.7 (H) 11/13/2020      Assessment / Plan:    -Coronary artery disease manifested by acute non-ST elevation myocardial infarction while in the hospital after presenting for management of  cellulitis in his right lower extremity.  Left heart catheterization performed yesterday demonstrates severe three-vessel coronary artery disease.  Ejection fraction is 40 to 45% by echocardiography.  There were no significant functional valvular abnormalities but there was significant aortic valve calcification and sclerosis without stenosis. Coronary bypass grafting will likely be his best option for management of his coronary artery disease.  We have begun initial work-up that will include carotid Doppler studies and CT scan of the chest to evaluate the proximal and ascending aorta.  Timing of surgery is yet to be determined.  Dr. Orvan Seen will see Jeremy Hickman later and review clinical data.   -Type 1 diabetes mellitus.  Hemoglobin A1c is down admission was 6.7.  Glucose control has been erratic since admission ranging from 50s to in excess of 300.  The diabetes coordinator as seen Mr. Nidiffer and made recommendations for modifications in management of his diabetes medications.  -History of hypertension-controlled on carvedilol, irbesartan, and terazosin.  -Cellulitis right lower extremity-currently on Unasyn and Zyvox.  He continues to have significant erythema and edema extending from the knee to the ankle.  He had an ultrasound prior to admission that reportedly shows no evidence of DVT.  -History of Dementia- apparently mild as he was quite appropriate during the interview today.   I  spent 30 minutes counseling the patient face to face.   Antony Odea, PA-C  11/19/2020 1:53 PM

## 2020-11-19 NOTE — Progress Notes (Signed)
[facility] for IV Heparin Indication: chest pain/ACS  Allergies  Allergen Reactions  . Iodinated Diagnostic Agents Hives    Patient Measurements: Height: 5\' 8"  (172.7 cm) Weight: 106.7 kg (235 lb 3.7 oz) IBW/kg (Calculated) : 68.4 Heparin Dosing Weight: 92.5 kg  Vital Signs: Temp: 98.8 F (37.1 C) (02/09 0432) Temp Source: Oral (02/09 0432) BP: 100/48 (02/09 0432) Pulse Rate: 64 (02/09 0432)  Labs: Recent Labs    11/17/20 0249 11/18/20 0138 11/18/20 1238 11/18/20 1315 11/18/20 1318 11/19/20 0556  HGB 10.8* 11.2*   < > 12.2*  12.2* 12.6* 10.5*  HCT 30.7* 33.8*   < > 36.0*  36.0* 37.0* 30.1*  PLT 379 409*  --   --   --  400  HEPARINUNFRC 0.55 0.54  --   --   --  0.38  CREATININE 1.15 0.94  --   --   --  1.17   < > = values in this interval not displayed.    Estimated Creatinine Clearance: 68.6 mL/min (by C-G formula based on SCr of 1.17 mg/dL).   Assessment: 70 yr old man admitted on 11/12/20 with R leg cellulitis c/b by NSTEMI. Pt s/p LHC with mvCAD and plans for surgical consult. Heparin restarted 8h post-cath.  Heparin level therapeutic at 0.38 this morning, CBC stable.  Goal of Therapy:  Heparin level 0.3-0.7 units/ml Monitor platelets by anticoagulation protocol: Yes   Plan:  Continue heparin 1900 units/h Daily heparin level and CBC   Arrie Senate, PharmD, BCPS, [facility] Clinical Pharmacist Please check AMION for all [facility] numbers 11/19/2020

## 2020-11-19 NOTE — Progress Notes (Addendum)
Progress Note  Patient Name: Jeremy Hickman Date of Encounter: 11/19/2020  [facility] Cardiologist: No primary care provider on file.   Subjective   Sitting up in chair eating breakfast.  Less short of breath than he was yesterday during the heart catheterization.  Discussed once again the findings of his heart cath, severe CAD.  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  Inpatient Medications    Scheduled Meds: . aspirin  81 mg Oral Daily  . atorvastatin  80 mg Oral Daily  . busPIRone  15 mg Oral QPM  . busPIRone  30 mg Oral Q0600  . carvedilol  12.5 mg Oral BID WC  . docusate sodium  100 mg Oral BID  . donepezil  5 mg Oral QHS  . ezetimibe  10 mg Oral Daily  . FLUoxetine  20 mg Oral Daily  . folic acid  1 mg Oral Daily  . furosemide  40 mg Intravenous BID  . guaiFENesin  600 mg Oral BID  . hydrocerin   Topical Daily  . insulin aspart  0-9 Units Subcutaneous Q4H  . linezolid  600 mg Oral Q12H  . loratadine  10 mg Oral Daily  . mouth rinse  15 mL Mouth Rinse BID  . melatonin  3 mg Oral QHS  . metoCLOPramide (REGLAN) injection  5 mg Intravenous Q8H  . multivitamin with minerals  1 tablet Oral Daily  . nutrition supplement (JUVEN)  1 packet Oral BID BM  . pantoprazole  40 mg Oral Daily  . potassium chloride  40 mEq Oral Daily  . Ensure Max Protein  11 oz Oral QHS  . sodium chloride flush  3 mL Intravenous Q12H  . terazosin  5 mg Oral QHS  . thiamine  100 mg Oral Daily   Or  . thiamine  100 mg Intravenous Daily   Continuous Infusions: . sodium chloride 10 mL/hr at 11/14/20 0151  . sodium chloride    . ampicillin-sulbactam (UNASYN) IV 3 g (11/19/20 0828)  . heparin 1,900 Units/hr (11/19/20 0400)  . nitroGLYCERIN 20 mcg/min (11/19/20 0439)   PRN Meds: sodium chloride, acetaminophen **OR** acetaminophen, acetaminophen, alum & mag hydroxide-simeth, bisacodyl,  diphenhydrAMINE, hydrALAZINE, HYDROcodone-acetaminophen, ipratropium-albuterol, morphine injection, nitroGLYCERIN, ondansetron **OR** ondansetron (ZOFRAN) IV, polyethylene glycol, sodium chloride flush, temazepam   Vital Signs    Vitals:   11/19/20 0433 11/19/20 0437 11/19/20 0800 11/19/20 0822  BP:   (!) 143/62 (!) 143/62  Pulse:   76 76  Resp: 18  16   Temp:   97.9 F (36.6 C)   TempSrc:   Oral   SpO2:   100%   Weight:  106.7 kg    Height:        Intake/Output Summary (Last 24 hours) at 11/19/2020 0935 Last data filed at 11/19/2020 0800 Gross per 24 hour  Intake 1039.47 ml  Output 3560 ml  Net -2520.53 ml   Last 3 Weights 11/19/2020 11/18/2020 11/17/2020  Weight (lbs) 235 lb 3.7 oz 240 lb 1.3 oz 240 lb 1.3 oz  Weight (kg) 106.7 kg 108.9 kg 108.9 kg      Telemetry    No adverse arrhythmias- Personally Reviewed  ECG    No new- Personally Reviewed  Physical Exam   GEN: No acute distress.   Neck: No JVD Cardiac: RRR, no murmurs, rubs, or gallops.  Respiratory: Clear to auscultation bilaterally.  GI: Soft, nontender, non-distended  MS:  Improved lower extremity edema; No deformity.  Right lower extremity less erythematous Neuro:  Nonfocal  Psych: Normal affect   Labs    High Sensitivity Troponin:   Recent Labs  Lab 11/14/20 1656 11/14/20 1916 11/15/20 0802  TROPONINIHS 6,603* 7,382* 4,065*      Chemistry Recent Labs  Lab 11/12/20 2144 11/14/20 0448 11/15/20 0253 11/16/20 7371 11/17/20 0249 11/18/20 0138 11/18/20 1238 11/18/20 1315 11/18/20 1318 11/19/20 0556  NA 126*   < > 130* 135 134* 137   < > 138  138 139 136  K 4.2   < > 5.0 5.1 4.6 4.0   < > 4.6  4.6 4.6 4.3  CL 93*   < > 95* 99 98 101  --   --   --  99  CO2 23   < > 21* 21* 26 24  --   --   --  26  GLUCOSE 139*   < > 324* 264* 188* 57*  --   --   --  182*  BUN 30*   < > 28* 44* 57* 46*  --   --   --  41*  CREATININE 1.11   < > 1.22 1.15 1.15 0.94  --   --   --  1.17  CALCIUM 8.7*   < > 8.7*  8.9 8.8* 9.1  --   --   --  8.7*  PROT 6.7  --  5.9* 5.8*  --   --   --   --   --   --   ALBUMIN 3.2*  --  2.2* 2.2* 2.0* 2.2*  --   --   --   --   AST 77*  --  54* 32  --   --   --   --   --   --   ALT 82*  --  65* 69*  --   --   --   --   --   --   ALKPHOS 223*  --  244* 183*  --   --   --   --   --   --   BILITOT 0.3  --  1.0 1.0  --   --   --   --   --   --   GFRNONAA >60   < > >60 >60 >60 >60  --   --   --  >60  ANIONGAP 10   < > 14 15 10 12   --   --   --  11   < > = values in this interval not displayed.     Hematology Recent Labs  Lab 11/17/20 0249 11/18/20 0138 11/18/20 1238 11/18/20 1315 11/18/20 1318 11/19/20 0556  WBC 21.9* 16.9*  --   --   --  14.2*  RBC 3.29* 3.54*  --   --   --  3.19*  HGB 10.8* 11.2*   < > 12.2*  12.2* 12.6* 10.5*  HCT 30.7* 33.8*   < > 36.0*  36.0* 37.0* 30.1*  MCV 93.3 95.5  --   --   --  94.4  MCH 32.8 31.6  --   --   --  32.9  MCHC 35.2 33.1  --   --   --  34.9  RDW 14.1 13.8  --   --   --  13.5  PLT 379 409*  --   --   --  400   < > =  values in this interval not displayed.    BNP Recent Labs  Lab 11/14/20 0448  BNP 859.3*     DDimer No results for input(s): DDIMER in the last 168 hours.   Radiology    CARDIAC CATHETERIZATION  Result Date: 11/18/2020  Prox RCA to Mid RCA lesion is 90% stenosed.  Prox RCA lesion is 80% stenosed.  Prox LAD lesion is 95% stenosed.  Mid Cx lesion is 95% stenosed.  Prox Cx lesion is 60% stenosed with 60% stenosed side branch in 1st Mrg.  Mid RCA to Dist RCA lesion is 100% stenosed.  Mid LAD lesion is 80% stenosed.  Mid LAD to Dist LAD lesion is 80% stenosed.  Severe multivessel CAD with evidence for coronary calcification.  The LAD has 95% very proximal stenosis followed by diffuse 80% proximal- mid to mid stenoses; The circumflex vessel has 60% stenosis proximal to a large first marginal branch with 50% mid marginal stenosis and 95% AV groove stenosis. The RCA has diffuse 80% proximal stenosis  followed by 90% mid stenosis before being totally occluded proximal to the acute margin.  There are are right to right collaterals supplying a portion of the distal RCA.  There are more extensive left to right collaterals from the circumflex and LAD supplying the PDA and PLA vessels. Elevated right heart pressures moderate pulmonary hypertension with PA mean pressure 36 mm. RECOMMENDATION: Patient has significant multivessel CAD and will ultimately need surgical consultation for CABG revascularization.  Plan to continue aggressive diuresis with volume overload, treatment for his cellulitis.  Will initiate heparin and IC nitroglycerin.  Aggressive lipid-lowering therapy with target LDL less than 70 and optimal blood pressure control.    Cardiac Studies   Cath 11/18/20:   Prox RCA to Mid RCA lesion is 90% stenosed.  Prox RCA lesion is 80% stenosed.  Prox LAD lesion is 95% stenosed.  Mid Cx lesion is 95% stenosed.  Prox Cx lesion is 60% stenosed with 60% stenosed side branch in 1st Mrg.  Mid RCA to Dist RCA lesion is 100% stenosed.  Mid LAD lesion is 80% stenosed.  Mid LAD to Dist LAD lesion is 80% stenosed.   Severe multivessel CAD with evidence for coronary calcification.    The LAD has 95% very proximal stenosis followed by diffuse 80% proximal- mid to mid stenoses;  The circumflex vessel has 60% stenosis proximal to a large first marginal branch with 50% mid marginal stenosis and 95% AV groove stenosis.  The RCA has diffuse 80% proximal stenosis followed by 90% mid stenosis before being totally occluded proximal to the acute margin.  There are are right to right collaterals supplying a portion of the distal RCA.  There are more extensive left to right collaterals from the circumflex and LAD supplying the PDA and PLA vessels.  Elevated right heart pressures moderate pulmonary hypertension with PA mean pressure 36 mm.  RECOMMENDATION: Patient has significant multivessel CAD and  will ultimately need surgical consultation for CABG revascularization.  Plan to continue aggressive diuresis with volume overload, treatment for his cellulitis.  Will initiate heparin and IC nitroglycerin.  Aggressive lipid-lowering therapy with target LDL less than 70 and optimal blood pressure control.  Diagnostic Dominance: Right    Right Heart Pressures RA: A-wave 16, V wave 15; mean 15 RV: 54/19 PA: 52/20; mean 36 PW A-wave 30, V wave 30 mean 27  Initial AO 140/74 Initial LV 150/50  Pullback: Following good diuresis with urine output 650 cc LV: 140/39 Ao: 147/78  Oxygen saturation the pulmonary artery 69% and in the aorta at 99%. By the Fick method, cardiac output 6.4 L/min and cardiac index 2.9 L/min/m.   PVR: 1.4 WU     1. Left ventricular ejection fraction, by estimation, is 40 to 45%. The  left ventricle has mildly decreased function. The left ventricle  demonstrates regional wall motion abnormalities (see scoring  diagram/findings for description). There is mild left  ventricular hypertrophy. Left ventricular diastolic parameters are  consistent with Grade I diastolic dysfunction (impaired relaxation).  Elevated left atrial pressure.  2. Right ventricular systolic function is normal. The right ventricular  size is normal.  3. The mitral valve is normal in structure. No evidence of mitral valve  regurgitation. No evidence of mitral stenosis.  4. The aortic valve is normal in structure. Aortic valve regurgitation is  not visualized. Mild to moderate aortic valve sclerosis/calcification is  present, without any evidence of aortic stenosis.  5. The inferior vena cava is normal in size with greater than 50%  respiratory variability, suggesting right atrial pressure of 3 mmHg.   Patient Profile     70 y.o. male with non-ST elevation myocardial infarction, severe multivessel coronary artery disease discovered on catheterization 11/18/2020, acute on chronic  systolic heart failure EF 40 to 45% with right lower extremity cellulitis on IV antibiotics, diabetes with hypertension and hyperlipidemia  Assessment & Plan    Severe multivessel coronary artery disease with non-ST elevation myocardial infarction in the setting of diabetes with ejection fraction of 40 to 45% and troponin of 7000 peak -Consulting cardiothoracic surgery for potential CABG.  His right lower extremity cellulitis certainly has improved dramatically since his admission.  Understand that this may influence timing of surgery. -Continue with atorvastatin 80 aspirin 81 carvedilol  Acute systolic heart failure -EF 40 to 45% -Cardiac catheterization/right heart catheterization showed elevated left filling pressures, these improved significantly after aggressive diuresis yesterday.  He was wheezing quite significantly when laying down on the cath table prior to cath.  Appreciate Dr. Claiborne Billings administering Lasix etc. -Continuing with IV Lasix 40 twice daily. -Potassium supplementation -Continuing with beta-blocker.  Hypertension Hyperlipidemia Diabetes -Changed to high intensity statin. -We will go ahead and start back telmisartan (irbesartan substitute) as he was taking as an outpatient, angiotensin receptor blocker. -We will also restart spironolactone 12.5 once a day. -Later on, may benefit from [place].  Right lower extremity cellulitis -Markedly improved with IV antibiotics.  Per primary team.  Discussed with him CODE STATUS.  He now desires to be a full code.  He states that "I want to live ".  We have made appropriate changes to his CODE STATUS, now full code.  For questions or updates, please contact Bryan Please consult [email or web address] for contact info under        Signed, Candee Furbish, MD  11/19/2020, 9:35 AM

## 2020-11-19 NOTE — Progress Notes (Signed)
Nutrition Follow-up  DOCUMENTATION CODES:   Obesity unspecified  INTERVENTION:   - Continue MVI with minerals daily  - Continue Ensure Max po daily, each supplement provides 150 kcal and 30 grams of protein  - Continue 1 packet Juven BID, each packet provides 95 calories, 2.5 grams of protein, and 9.8 grams of carbohydrate; also contains L-arginine and L-glutamine, vitamin C, vitamin E, vitamin B-12, zinc, calcium, and calcium Beta-hydroxy-Beta-methylbutyrate to support wound healing  NUTRITION DIAGNOSIS:   Increased nutrient needs related to acute illness as evidenced by estimated needs.  Ongoing  GOAL:   Patient will meet greater than or equal to 90% of their needs  Progressing  MONITOR:   PO intake,Supplement acceptance,Labs,Weight trends,Skin,I & O's  REASON FOR ASSESSMENT:   Consult Assessment of nutrition requirement/status  ASSESSMENT:   70 year old male with PMH of T1DM, HTN, dyslipidemia, OSA, known OSA, early dementia presenting with RLE cellulitis.   Pt admitted with RLE cellulitis.  2/04 - NSTEMI 2/08 - s/p LHC showing severe CAD  CTS consulted and pt being worked up for CABG which is planned for 11/21/20.  Spoke with pt at bedside. Pt asleep at time of RD visit but awoke briefly to RD touch. Pt reports good appetite and PO intake. Noted ~90% completed lunch meal tray at bedside. Pt states that he is taking the Juven and Ensure Max supplements without issue. Will continue with current oral nutrition supplement regimen in preparation for surgery tomorrow.  Noted pt had episode of "projectile vomiting" on 2/04. No other episodes of vomiting noted since that date.  Admit weight: 108.7 kg Current weight: 100.3 kg  Weight trending down since admission. Pt is net negative 2.3 L since admit. Will continue to monitor trends.  Meal Completion: 50-100%  Medications reviewed and include: colace, folic acid, lasix, SSI, novolog 4 units TID with meals, lantus 15  units daily, melatonin, IV reglan 5 mg q 8 hours, MVI with minerals, Juven BID, protonix, klor-con, Ensure Max daily, spironolactone, thiamine  Labs reviewed. CBG's: 176-257 x 24 hours  UOP: 3895 ml x 24 hours I/O's: -2.3 L since admit  NUTRITION - FOCUSED PHYSICAL EXAM:  Flowsheet Row Most Recent Value  Orbital Region No depletion  Upper Arm Region No depletion  Thoracic and Lumbar Region No depletion  Buccal Region No depletion  Temple Region No depletion  Clavicle Bone Region Mild depletion  Clavicle and Acromion Bone Region Mild depletion  Scapular Bone Region No depletion  Dorsal Hand No depletion  Patellar Region No depletion  Anterior Thigh Region Mild depletion  Posterior Calf Region Mild depletion  Edema (RD Assessment) Moderate  [BLE]  Hair Reviewed  Eyes Reviewed  Mouth Reviewed  Skin Reviewed  Nails Reviewed       Diet Order:   Diet Order            Diet Heart Room service appropriate? Yes; Fluid consistency: Thin; Fluid restriction: 1500 mL Fluid  Diet effective now                 EDUCATION NEEDS:   No education needs have been identified at this time  Skin:  Skin Assessment: Reviewed RN Assessment (RLE cellulitis)  Last BM:  11/18/20  Height:   Ht Readings from Last 1 Encounters:  11/13/20 5\' 8"  (1.727 m)    Weight:   Wt Readings from Last 1 Encounters:  11/20/20 100.3 kg    Ideal Body Weight:  70 kg  BMI:  Body mass index is 33.62  kg/m.  Estimated Nutritional Needs:   Kcal:  1900-2100  Protein:  110-125 grams  Fluid:  > 1.9 L    Gustavus Bryant, MS, RD, LDN Inpatient Clinical Dietitian Please see AMiON for contact information.

## 2020-11-19 NOTE — Progress Notes (Signed)
RT placed pt on V60 in CPAP mode for the night. Pt tolerating well. RT will continue to monitor.

## 2020-11-19 NOTE — Progress Notes (Signed)
Pre-CABG testing has been completed. Preliminary results can be found in CV Proc through chart review.   11/19/20 3:17 PM Jeremy Hickman RVT

## 2020-11-19 NOTE — Progress Notes (Signed)
There was order for CT of chest, called twice to get the patient, but hasn't come yet. Called CT room no one pick up the phone. Will pass on to night shift nurse regarding this. HS [employer]

## 2020-11-19 NOTE — Plan of Care (Signed)

## 2020-11-19 NOTE — Progress Notes (Signed)
TCTS consulted for CABG evaluation. °

## 2020-11-19 NOTE — Progress Notes (Signed)
Inpatient Diabetes Program Recommendations  AACE/ADA: New Consensus Statement on Inpatient Glycemic Control (2015)  Target Ranges:  Prepandial:   less than 140 mg/dL      Peak postprandial:   less than 180 mg/dL (1-2 hours)      Critically ill patients:  140 - 180 mg/dL   Lab Results  Component Value Date   GLUCAP 145 (H) 11/19/2020   HGBA1C 6.7 (H) 11/13/2020    Review of Glycemic Control Results for Jeremy Hickman, Jeremy Hickman (MRN [id number]) as of 11/19/2020 09:49  Ref. Range 11/18/2020 17:46 11/18/2020 20:05 11/19/2020 00:21 11/19/2020 04:31 11/19/2020 07:59  Glucose-Capillary Latest Ref Range: 70 - 99 mg/dL 247 (H) 294 (H) 255 (H) 172 (H) 145 (H)   Diabetes history: Type 1 DM Outpatient Diabetes medications: Lantus 42 units q HS, Novolog 15 units tid with meals (patient manages his own doses to keep  Current orders for Inpatient glycemic control:  Novolog sensitive q 4 hours  Inpatient Diabetes Program Recommendations:   Note history of Type 1 DM. Please consider restarting Lantus 25 units daily.  Also consider reducing Novolog correction frequency to tid with meals and HS.  Please add Novolog meal coverage 4 units tid with meals (hold if patient eats less than 50% or NPO).    Thanks,  Adah Perl, RN, BC-ADM Inpatient Diabetes Coordinator Pager [phone] (8a-5p)

## 2020-11-19 NOTE — Progress Notes (Signed)
PROGRESS NOTE  Jeremy Hickman IRW:[id number] DOB: 1950-12-11   PCP: Malachi Pro [name], PA-C  Patient is from: Home. Independently ambulates at baseline.  DOA: 11/12/2020 LOS: 6  Chief complaints: Right leg swelling, redness and weeping  Brief Narrative / Interim history: 70 year old male with PMH of OSA on CPAP, HTN, DM-2, morbid obesity, anxiety and dementia presented to [facility] ED with RLE swelling, redness, weeping, pain and difficulty bearing weight, and admitted for cellulitis after he failed outpatient treatment with p.o. clindamycin. His LE [place] was negative for DVT on 11/11/2020 at [facility].  Initially started on IV ceftriaxone but antibiotics cannulated to cefepime and vancomycin.  The next day, patient developed respiratory distress. CXR without acute finding. BNP elevated to 800.  Respiratory distress improved after brief BiPAP, IV Lasix, DuoNeb and anxiolytics.  TTE showed LVEF of 40 to 45%, G1 DD and RWMA.  Troponin elevated to 6600.  Started on heparin drip and cardiology consulted.  Patient underwent R/LHC on 11/18/2020 that showed multivessel CAD with moderate pHTN. Cardiology consulted CTS for CABG evaluation.  Subjective: Seen and examined earlier this morning. No major events overnight of this morning. He says he was able to sleep well after he had his BiPAP on. Denies chest pain or dyspnea. Denies GI or UTI symptoms.  Objective: Vitals:   11/19/20 0433 11/19/20 0437 11/19/20 0800 11/19/20 0822  BP:   (!) 143/62 (!) 143/62  Pulse:   76 76  Resp: 18  16   Temp:   97.9 F (36.6 C)   TempSrc:   Oral   SpO2:   100%   Weight:  106.7 kg    Height:        Intake/Output Summary (Last 24 hours) at 11/19/2020 1204 Last data filed at 11/19/2020 1000 Gross per 24 hour  Intake 1279.56 ml  Output 3935 ml  Net -2655.44 ml   Filed Weights   11/17/20 0500 11/18/20 0500 11/19/20 0437  Weight: 108.9 kg 108.9 kg 106.7 kg    Examination:  GENERAL: No apparent distress.   Nontoxic. HEENT: MMM.  Vision and hearing grossly intact.  NECK: Supple.  No apparent JVD but difficult due to body habitus.  RESP: 99% on BiPAP. No IWOB.  Fair aeration bilaterally. CVS:  RRR. Heart sounds normal.  ABD/GI/GU: BS+. Abd soft, NTND.  MSK/EXT:  Moves extremities. No apparent deformity. 2+ pitting edema in RLE. SKIN: Mild residual erythema in RLE. NEURO: Awake, alert and oriented appropriately.  No apparent focal neuro deficit. PSYCH: Calm. Normal affect.  Procedures:  11/18/20-R/LHC with multivessel CAD and moderate pulmonary hypertension.  Microbiology summarized: COVID-19 PCR nonreactive. Blood cultures NGTD.  Assessment & Plan: Non-STEMI/multivessel CAD in patient with history of prior MI-R/LHC as above. -On IV heparin, Lipitor 80 mg daily, Zetia and low-dose aspirin per cardiology -Cardiology consulted CTS for CABG evaluation.  Acute on chronic combined CHF: TTE as above. BNP 860 (no prior to compare to).  No significant pulmonary edema on CXR.  Has some RLE swelling although in the setting of cellulitis and chronic venous insufficiency. About 3.3 L UOP/24 hours. Soft blood pressures. Renal function stable. -Cardiology managing-on IV Lasix 40 mg twice daily. -GDMT-on Coreg 12.5 mg twice daily. I'll be cautious with Coreg given his PFT -Home ARB on hold.  -Closely monitor fluid status, renal functions and electrolytes  Sepsis due to RLE Cellulitis: POA.  Failed outpatient p.o. antibiotics.  Cellulitis improved. Of note, patient has history of chronic venous insufficiency in RLE. -RLE [place] negative for  DVT on 2/1 per care everywhere. -IV vancomycin 2/3-2/7>> p.o. Zyvox>> 2/10 -Cefepime 2/3-2/5>> Unasyn>> 2/9 -Diabetic control  Essential hypertension: Normotensive. -Coreg, Terazosin and Lasix as above -Home Micardis on hold  Controlled IDDM-1 with hyperglycemia HLD and macrovascular complication: O6Z 1.2%.  Recent Labs  Lab 11/18/20 2005 11/19/20 0021  11/19/20 0431 11/19/20 0759 11/19/20 1145  GLUCAP 294* 255* 172* 145* 257*  -Add Lantus 15 units daily -Increase SSI to moderate. Add nightly coverage. -NovoLog AC 4 units -High intensity statin as above -Further adjustment as appropriate  Elevated liver enzymes: Could be due to non-STEMI/sepsis.  Improving. -Continue monitoring  Hyponatremia: Resolved. -Continue monitoring  Dementia without behavioral disturbance: Fairly oriented. -Continue Buspar, Prozac, Restoril, Melatonin, trazodone -Continue Aricept  Anxiety/insomnia: Seems stable now. -Continue home BuSpar, Prozac, trazodone, Restoril and melatonin -Continue as needed Ativan  Alcohol use: Reports drinking 2 beers a day. -Discontinue CIWA monitoring  OSA on CPAP -Continue nightly CPAP  Leukocytosis/bandemia: Likely due to cellulitis.  Improving. -Continue monitoring  Morbid obesity -Weight loss should be encouraged -Outpatient PCP/bariatric medicine f/u encouraged Body mass index is 35.77 kg/m. Nutrition Problem: Increased nutrient needs Etiology: acute illness Signs/Symptoms: estimated needs Interventions: MVI,Juven,Premier Protein   DVT prophylaxis:  Subcu Lovenox  Code Status: DNR/DNI Family Communication: Updated patient's wife over the phone Level of care: Progressive Cardiac Status is: Inpatient  Remains inpatient appropriate because:Ongoing diagnostic testing needed not appropriate for outpatient work up, Unsafe d/c plan, IV treatments appropriate due to intensity of illness or inability to take PO and Inpatient level of care appropriate due to severity of illness   Dispo: The patient is from: Home              Anticipated d/c is to: Home              Anticipated d/c date is: > 3 days              Patient currently is not medically stable to d/c.   Difficult to place patient No       Consultants:  Cardiology Cardiothoracic surgery   Sch Meds:  Scheduled Meds: . aspirin  81 mg Oral  Daily  . atorvastatin  80 mg Oral Daily  . busPIRone  15 mg Oral QPM  . busPIRone  30 mg Oral Q0600  . carvedilol  12.5 mg Oral BID WC  . docusate sodium  100 mg Oral BID  . donepezil  5 mg Oral QHS  . ezetimibe  10 mg Oral Daily  . FLUoxetine  20 mg Oral Daily  . folic acid  1 mg Oral Daily  . furosemide  40 mg Intravenous BID  . guaiFENesin  600 mg Oral BID  . hydrocerin   Topical Daily  . insulin aspart  0-15 Units Subcutaneous TID WC  . insulin aspart  0-5 Units Subcutaneous QHS  . insulin aspart  4 Units Subcutaneous TID WC  . insulin glargine  15 Units Subcutaneous Daily  . irbesartan  300 mg Oral Daily  . linezolid  600 mg Oral Q12H  . loratadine  10 mg Oral Daily  . mouth rinse  15 mL Mouth Rinse BID  . melatonin  3 mg Oral QHS  . metoCLOPramide (REGLAN) injection  5 mg Intravenous Q8H  . multivitamin with minerals  1 tablet Oral Daily  . nutrition supplement (JUVEN)  1 packet Oral BID BM  . pantoprazole  40 mg Oral Daily  . potassium chloride  40 mEq Oral Daily  .  Ensure Max Protein  11 oz Oral QHS  . sodium chloride flush  3 mL Intravenous Q12H  . spironolactone  12.5 mg Oral Daily  . terazosin  5 mg Oral QHS  . thiamine  100 mg Oral Daily   Or  . thiamine  100 mg Intravenous Daily   Continuous Infusions: . sodium chloride 10 mL/hr at 11/14/20 0151  . sodium chloride    . ampicillin-sulbactam (UNASYN) IV 3 g (11/19/20 0828)  . heparin 1,900 Units/hr (11/19/20 1201)  . nitroGLYCERIN 20 mcg/min (11/19/20 0439)   PRN Meds:.sodium chloride, acetaminophen **OR** acetaminophen, acetaminophen, alum & mag hydroxide-simeth, bisacodyl, diphenhydrAMINE, hydrALAZINE, HYDROcodone-acetaminophen, ipratropium-albuterol, morphine injection, nitroGLYCERIN, ondansetron **OR** ondansetron (ZOFRAN) IV, polyethylene glycol, sodium chloride flush, temazepam  Antimicrobials: Anti-infectives (From admission, onward)   Start     Dose/Rate Route Frequency Ordered Stop   11/17/20 1115   linezolid (ZYVOX) tablet 600 mg        600 mg Oral Every 12 hours 11/17/20 1023 11/20/20 0959   11/15/20 0800  Ampicillin-Sulbactam (UNASYN) 3 g in sodium chloride 0.9 % 100 mL IVPB        3 g 200 mL/hr over 30 Minutes Intravenous Every 6 hours 11/15/20 0726 11/19/20 2359   11/14/20 1630  vancomycin (VANCOREADY) IVPB 1500 mg/300 mL  Status:  Discontinued        1,500 mg 150 mL/hr over 120 Minutes Intravenous Every 24 hours 11/13/20 1542 11/17/20 1023   11/13/20 2300  cefTRIAXone (ROCEPHIN) 1 g in sodium chloride 0.9 % 100 mL IVPB  Status:  Discontinued        1 g 200 mL/hr over 30 Minutes Intravenous Every 24 hours 11/13/20 1158 11/13/20 1510   11/13/20 1600  vancomycin (VANCOCIN) 2,250 mg in sodium chloride 0.9 % 500 mL IVPB        2,250 mg 250 mL/hr over 120 Minutes Intravenous  Once 11/13/20 1510 11/13/20 1855   11/13/20 1600  ceFEPIme (MAXIPIME) 2 g in sodium chloride 0.9 % 100 mL IVPB  Status:  Discontinued        2 g 200 mL/hr over 30 Minutes Intravenous  Once 11/13/20 1510 11/13/20 1542   11/13/20 1600  ceFEPIme (MAXIPIME) 2 g in sodium chloride 0.9 % 100 mL IVPB  Status:  Discontinued        2 g 200 mL/hr over 30 Minutes Intravenous Every 8 hours 11/13/20 1542 11/15/20 0726   11/13/20 0000  cefTRIAXone (ROCEPHIN) 2 g in sodium chloride 0.9 % 100 mL IVPB        2 g 200 mL/hr over 30 Minutes Intravenous  Once 11/12/20 2352 11/13/20 0042       I have personally reviewed the following labs and images: CBC: Recent Labs  Lab 11/12/20 2144 11/14/20 0448 11/15/20 0253 11/16/20 0608 11/17/20 0249 11/18/20 0138 11/18/20 1238 11/18/20 1315 11/18/20 1318 11/19/20 0556  WBC 15.6*   < > 23.0* 20.3* 21.9* 16.9*  --   --   --  14.2*  NEUTROABS 13.2*  --   --   --   --   --   --   --   --   --   HGB 13.2   < > 12.6* 12.0* 10.8* 11.2* 11.6* 12.2*  12.2* 12.6* 10.5*  HCT 38.1*   < > 35.6* 34.1* 30.7* 33.8* 34.0* 36.0*  36.0* 37.0* 30.1*  MCV 93.6   < > 93.4 94.5 93.3 95.5  --   --    --  94.4  PLT 217   < > 368 362 379 409*  --   --   --  400   < > = values in this interval not displayed.   BMP &GFR Recent Labs  Lab 11/15/20 0253 11/16/20 0608 11/17/20 0249 11/18/20 0138 11/18/20 1238 11/18/20 1315 11/18/20 1318 11/19/20 0556  NA 130* 135 134* 137 141 138  138 139 136  K 5.0 5.1 4.6 4.0 4.4 4.6  4.6 4.6 4.3  CL 95* 99 98 101  --   --   --  99  CO2 21* 21* 26 24  --   --   --  26  GLUCOSE 324* 264* 188* 57*  --   --   --  182*  BUN 28* 44* 57* 46*  --   --   --  41*  CREATININE 1.22 1.15 1.15 0.94  --   --   --  1.17  CALCIUM 8.7* 8.9 8.8* 9.1  --   --   --  8.7*  MG 2.2 2.3 2.4 2.2  --   --   --   --   PHOS 3.6 4.1 4.0 4.1  --   --   --   --    Estimated Creatinine Clearance: 68.6 mL/min (by C-G formula based on SCr of 1.17 mg/dL). Liver & Pancreas: Recent Labs  Lab 11/12/20 2144 11/15/20 0253 11/16/20 0608 11/17/20 0249 11/18/20 0138  AST 77* 54* 32  --   --   ALT 82* 65* 69*  --   --   ALKPHOS 223* 244* 183*  --   --   BILITOT 0.3 1.0 1.0  --   --   PROT 6.7 5.9* 5.8*  --   --   ALBUMIN 3.2* 2.2* 2.2* 2.0* 2.2*   No results for input(s): LIPASE, AMYLASE in the last 168 hours. No results for input(s): AMMONIA in the last 168 hours. Diabetic: No results for input(s): HGBA1C in the last 72 hours. Recent Labs  Lab 11/18/20 2005 11/19/20 0021 11/19/20 0431 11/19/20 0759 11/19/20 1145  GLUCAP 294* 255* 172* 145* 257*   Cardiac Enzymes: No results for input(s): CKTOTAL, CKMB, CKMBINDEX, TROPONINI in the last 168 hours. No results for input(s): PROBNP in the last 8760 hours. Coagulation Profile: No results for input(s): INR, PROTIME in the last 168 hours. Thyroid Function Tests: No results for input(s): TSH, T4TOTAL, FREET4, T3FREE, THYROIDAB in the last 72 hours. Lipid Profile: No results for input(s): CHOL, HDL, LDLCALC, TRIG, CHOLHDL, LDLDIRECT in the last 72 hours. Anemia Panel: No results for input(s): VITAMINB12, FOLATE,  FERRITIN, TIBC, IRON, RETICCTPCT in the last 72 hours. Urine analysis:    Component Value Date/Time   COLORURINE YELLOW 11/12/2020 1832   APPEARANCEUR CLEAR 11/12/2020 1832   LABSPEC 1.015 11/12/2020 1832   PHURINE 5.0 11/12/2020 1832   GLUCOSEU NEGATIVE 11/12/2020 1832   HGBUR NEGATIVE 11/12/2020 1832   BILIRUBINUR NEGATIVE 11/12/2020 1832   KETONESUR NEGATIVE 11/12/2020 1832   PROTEINUR NEGATIVE 11/12/2020 1832   NITRITE NEGATIVE 11/12/2020 1832   LEUKOCYTESUR NEGATIVE 11/12/2020 1832   Sepsis Labs: Invalid input(s): PROCALCITONIN, [place]  Microbiology: Recent Results (from the past 240 hour(s))  Culture, blood (routine x 2)     Status: None   Collection Time: 11/12/20  9:36 PM   Specimen: BLOOD  Result Value Ref Range Status   Specimen Description   Final    BLOOD Blood Culture adequate volume Performed at [facility], [address] [place], [place] [address]    Special Requests   Final    BOTTLES DRAWN AEROBIC AND ANAEROBIC RIGHT ANTECUBITAL Performed at [facility], [address] [place], [place] [address]    Culture   Final    NO GROWTH 5 DAYS Performed at [facility], [place] [address] [place], [place] [address]    Report Status 11/18/2020 FINAL  Final  SARS CORONAVIRUS 2 (TAT 6-24 HRS) Nasopharyngeal     Status: None   Collection Time: 11/12/20  9:44 PM   Specimen: Nasopharyngeal  Result Value Ref Range Status   SARS Coronavirus 2 NEGATIVE NEGATIVE Final    Comment: (NOTE) SARS-CoV-2 target nucleic acids are NOT DETECTED.  The SARS-CoV-2 RNA is generally detectable in upper and lower respiratory specimens during the acute phase of infection. Negative results do not preclude SARS-CoV-2 infection, do not rule out co-infections with other pathogens, and should not be used as the sole basis for treatment or other patient management decisions. Negative results must be combined with clinical observations, patient history, and  epidemiological information. The expected result is Negative.  Fact Sheet for Patients: SugarRoll.be  Fact Sheet for Healthcare Providers: https://www.woods-mathews.com/  This test is not yet approved or cleared by the [place] FDA and  has been authorized for detection and/or diagnosis of SARS-CoV-2 by FDA under an Emergency Use Authorization (EUA). This EUA will remain  in effect (meaning this test can be used) for the duration of the COVID-19 declaration under Se ction 564(b)(1) of the Act, 21 U.S.C. section 360bbb-3(b)(1), unless the authorization is terminated or revoked sooner.  Performed at [facility], [place] [address], [place], [place] [address]   Culture, blood (routine x 2)     Status: None   Collection Time: 11/12/20 10:11 PM   Specimen: BLOOD  Result Value Ref Range Status   Specimen Description   Final    BLOOD BLOOD RIGHT HAND Performed at [facility], [address] [place], [place] [address]    Special Requests   Final    BOTTLES DRAWN AEROBIC AND ANAEROBIC Blood Culture adequate volume Performed at [facility], [address] [place], [place] [address]    Culture   Final    NO GROWTH 5 DAYS Performed at [facility], [place] [address], [place], [place] [address]    Report Status 11/18/2020 FINAL  Final  MRSA PCR Screening     Status: None   Collection Time: 11/16/20  3:03 PM   Specimen: Nasal Mucosa; Nasopharyngeal  Result Value Ref Range Status   MRSA by PCR NEGATIVE NEGATIVE Final    Comment:        The GeneXpert MRSA Assay (FDA approved for NASAL specimens only), is one component of a comprehensive MRSA colonization surveillance program. It is not intended to diagnose MRSA infection nor to guide or monitor treatment for MRSA infections. Performed at [facility], [place] [address], [place], [place] [address]   Surgical pcr screen     Status: None   Collection  Time: 11/17/20 10:41 PM   Specimen: Nasal Mucosa; Nasal Swab  Result Value Ref Range Status   MRSA, PCR NEGATIVE NEGATIVE Final   Staphylococcus aureus NEGATIVE NEGATIVE Final    Comment: (NOTE) The Xpert SA Assay (FDA approved for NASAL specimens in patients 53 years of age and older), is one component of a comprehensive surveillance program. It is not intended to diagnose  infection nor to guide or monitor treatment. Performed at [facility], [place] [address] [place], [place] [address]     Radiology Studies: CARDIAC CATHETERIZATION  Result Date: 11/18/2020  Prox RCA to Mid RCA lesion is 90% stenosed.  Prox RCA lesion is 80% stenosed.  Prox LAD lesion is 95% stenosed.  Mid Cx lesion is 95% stenosed.  Prox Cx lesion is 60% stenosed with 60% stenosed side branch in 1st Mrg.  Mid RCA to Dist RCA lesion is 100% stenosed.  Mid LAD lesion is 80% stenosed.  Mid LAD to Dist LAD lesion is 80% stenosed.  Severe multivessel CAD with evidence for coronary calcification.  The LAD has 95% very proximal stenosis followed by diffuse 80% proximal- mid to mid stenoses; The circumflex vessel has 60% stenosis proximal to a large first marginal branch with 50% mid marginal stenosis and 95% AV groove stenosis. The RCA has diffuse 80% proximal stenosis followed by 90% mid stenosis before being totally occluded proximal to the acute margin.  There are are right to right collaterals supplying a portion of the distal RCA.  There are more extensive left to right collaterals from the circumflex and LAD supplying the PDA and PLA vessels. Elevated right heart pressures moderate pulmonary hypertension with PA mean pressure 36 mm. RECOMMENDATION: Patient has significant multivessel CAD and will ultimately need surgical consultation for CABG revascularization.  Plan to continue aggressive diuresis with volume overload, treatment for his cellulitis.  Will initiate heparin and IC nitroglycerin.  Aggressive  lipid-lowering therapy with target LDL less than 70 and optimal blood pressure control.    Jeremy Hickman  If 7PM-7AM, please contact night-coverage [email or web address] 11/19/2020, 12:04 PM

## 2020-11-19 NOTE — Progress Notes (Signed)
Physical Therapy Treatment Patient Details Name: Jeremy Hickman MRN: [id number] DOB: [birth year] Today's Date: 11/19/2020    History of Present Illness Patient is a 70 y/o male who presents with RLE redness, swelling and weeping. Found to have RLE cellulitis, failed outpatient antibiotics. Found to have NSTEMI. PLan for cardiac cath 11/17/20. PMH includes OSA on CPAP, HTN, DM-2, morbid obesity, anxiety and dementia.    PT Comments    Pt supine in bed on entry. Asked to see shrimp tattoo on his arm, then he proceeded to come to long sitting in bed to show off back tattoo. PT reported how impressed pt could come to long sitting and he proceeded to perform 5 more sit ups. Able to come to EoB with min guard and perform LAQ. Pt comes to standing at RW with min guard and ambulate 75 feet with RW with min guard. Pt request to use [facility] after which he requests to ambulate further. Met in hallway by transport who took him to CT. D/c plans remain appropriate. PT will continue to follow acutely.    Follow Up Recommendations  Home health PT;Supervision for mobility/OOB     Equipment Recommendations  Rolling walker with 5" wheels       Precautions / Restrictions Precautions Precautions: Fall Restrictions Weight Bearing Restrictions: No    Mobility  Bed Mobility Overal bed mobility: Needs Assistance Bed Mobility: Supine to Sit     Supine to sit: Min guard;HOB elevated     General bed mobility comments: min guard for safety with coming to EoB  Transfers Overall transfer level: Needs assistance Equipment used: Rolling walker (2 wheeled) Transfers: Sit to/from Stand Sit to Stand: Min guard         General transfer comment: min guard for safety with power up from bed and from [facility]  Ambulation/Gait Ambulation/Gait assistance: Min guard Gait Distance (Feet): 75 Feet (1x75, 1x50) Assistive device: Rolling walker (2 wheeled) Gait Pattern/deviations: Step-through pattern;Decreased stride  length;Trunk flexed;Shuffle Gait velocity: decreased Gait velocity interpretation: <1.8 ft/sec, indicate of risk for recurrent falls General Gait Details: Slow, mildly unsteady gait with use of RW; no pain with WB through RLE today.         Balance Overall balance assessment: Needs assistance Sitting-balance support: Feet supported;No upper extremity supported Sitting balance-Leahy Scale: Good     Standing balance support: During functional activity Standing balance-Leahy Scale: Poor Standing balance comment: Requires UE support in standing. Total A for pericare                            Cognition Arousal/Alertness: Awake/alert Behavior During Therapy: WFL for tasks assessed/performed;Impulsive Overall Cognitive Status: History of cognitive impairments - at baseline Area of Impairment: Memory;Following commands;Awareness;Safety/judgement;Problem solving                     Memory: Decreased short-term memory Following Commands: Follows one step commands with increased time;Follows one step commands consistently Safety/Judgement: Decreased awareness of safety;Decreased awareness of deficits Awareness: Intellectual;Anticipatory Problem Solving: Requires verbal cues;Slow processing;Requires tactile cues General Comments: likely at baseline level, slightly impulsive with movement      Exercises General Exercises - Lower Extremity Long Arc Quad: AROM;Both;10 reps;Seated Other Exercises Other Exercises: supine to longsit x5    General Comments General comments (skin integrity, edema, etc.): Pt continues to have erythema and edema in R LE, scaling of calf      Pertinent Vitals/Pain Pain Assessment: No/denies pain  PT Goals (current goals can now be found in the care plan section) Acute Rehab PT Goals PT Goal Formulation: With patient Time For Goal Achievement: 11/30/20 Potential to Achieve Goals: Good Progress towards PT goals: Progressing  toward goals    Frequency    Min 3X/week      PT Plan Current plan remains appropriate       AM-PAC PT "6 Clicks" Mobility   Outcome Measure  Help needed turning from your back to your side while in a flat bed without using bedrails?: A Little Help needed moving from lying on your back to sitting on the side of a flat bed without using bedrails?: A Little Help needed moving to and from a bed to a chair (including a wheelchair)?: A Little Help needed standing up from a chair using your arms (e.g., wheelchair or bedside chair)?: A Little Help needed to walk in hospital room?: A Little Help needed climbing 3-5 steps with a railing? : A Lot 6 Click Score: 17    End of Session Equipment Utilized During Treatment: Gait belt Activity Tolerance: Patient tolerated treatment well Patient left: in chair;with call bell/phone within reach;with chair alarm set;with nursing/sitter in room Nurse Communication: Mobility status PT Visit Diagnosis: Unsteadiness on feet (R26.81);Muscle weakness (generalized) (M62.81);Other abnormalities of gait and mobility (R26.89)     Time: 1947-1252 PT Time Calculation (min) (ACUTE ONLY): 26 min  Charges:  $Gait Training: 8-22 mins $Therapeutic Activity: 8-22 mins                     Jia Mohamed B. Migdalia Dk PT, DPT Acute Rehabilitation Services Pager [phone] Office [phone]    [place] 11/19/2020, 5:14 PM

## 2020-11-20 ENCOUNTER — Inpatient Hospital Stay (HOSPITAL_COMMUNITY): Payer: HMO

## 2020-11-20 DIAGNOSIS — E1065 Type 1 diabetes mellitus with hyperglycemia: Secondary | ICD-10-CM | POA: Diagnosis not present

## 2020-11-20 DIAGNOSIS — L0390 Cellulitis, unspecified: Secondary | ICD-10-CM | POA: Diagnosis not present

## 2020-11-20 DIAGNOSIS — I214 Non-ST elevation (NSTEMI) myocardial infarction: Secondary | ICD-10-CM | POA: Diagnosis not present

## 2020-11-20 DIAGNOSIS — I10 Essential (primary) hypertension: Secondary | ICD-10-CM | POA: Diagnosis not present

## 2020-11-20 DIAGNOSIS — L03115 Cellulitis of right lower limb: Secondary | ICD-10-CM | POA: Diagnosis not present

## 2020-11-20 LAB — CBC
HCT: 32.2 % — ABNORMAL LOW (ref 39.0–52.0)
Hemoglobin: 11.1 g/dL — ABNORMAL LOW (ref 13.0–17.0)
MCH: 32.6 pg (ref 26.0–34.0)
MCHC: 34.5 g/dL (ref 30.0–36.0)
MCV: 94.4 fL (ref 80.0–100.0)
Platelets: 410 10*3/uL — ABNORMAL HIGH (ref 150–400)
RBC: 3.41 MIL/uL — ABNORMAL LOW (ref 4.22–5.81)
RDW: 13.4 % (ref 11.5–15.5)
WBC: 10.8 10*3/uL — ABNORMAL HIGH (ref 4.0–10.5)
nRBC: 0 % (ref 0.0–0.2)

## 2020-11-20 LAB — BASIC METABOLIC PANEL
Anion gap: 10 (ref 5–15)
BUN: 38 mg/dL — ABNORMAL HIGH (ref 8–23)
CO2: 29 mmol/L (ref 22–32)
Calcium: 8.9 mg/dL (ref 8.9–10.3)
Chloride: 98 mmol/L (ref 98–111)
Creatinine, Ser: 1.09 mg/dL (ref 0.61–1.24)
GFR, Estimated: >60 mL/min (ref >60)
Glucose, Bld: 188 mg/dL — ABNORMAL HIGH (ref 70–99)
Potassium: 4.3 mmol/L (ref 3.5–5.1)
Sodium: 137 mmol/L (ref 135–145)

## 2020-11-20 LAB — URINALYSIS, ROUTINE W REFLEX MICROSCOPIC
Bacteria, UA: NONE SEEN
Bilirubin Urine: NEGATIVE
Glucose, UA: >=500 mg/dL — AB
Ketones, ur: NEGATIVE mg/dL
Leukocytes,Ua: NEGATIVE
Nitrite: NEGATIVE
Protein, ur: NEGATIVE mg/dL
Specific Gravity, Urine: 1.01 (ref 1.005–1.030)
WBC, UA: >50 WBC/hpf — ABNORMAL HIGH (ref 0–5)
pH: 6 (ref 5.0–8.0)

## 2020-11-20 LAB — BLOOD GAS, ARTERIAL
Acid-Base Excess: 7.1 mmol/L — ABNORMAL HIGH (ref 0.0–2.0)
Bicarbonate: 31 mmol/L — ABNORMAL HIGH (ref 20.0–28.0)
Drawn by: 56037
FIO2: 21
O2 Saturation: 93.1 %
Patient temperature: 36.8
pCO2 arterial: 43 mmHg (ref 32.0–48.0)
pH, Arterial: 7.47 — ABNORMAL HIGH (ref 7.350–7.450)
pO2, Arterial: 65.4 mmHg — ABNORMAL LOW (ref 83.0–108.0)

## 2020-11-20 LAB — SEDIMENTATION RATE: Sed Rate: 21 mm/hr — ABNORMAL HIGH (ref 0–16)

## 2020-11-20 LAB — GLUCOSE, CAPILLARY
Glucose-Capillary: 201 mg/dL — ABNORMAL HIGH (ref 70–99)
Glucose-Capillary: 165 mg/dL — ABNORMAL HIGH (ref 70–99)
Glucose-Capillary: 285 mg/dL — ABNORMAL HIGH (ref 70–99)
Glucose-Capillary: 347 mg/dL — ABNORMAL HIGH (ref 70–99)

## 2020-11-20 LAB — MAGNESIUM: Magnesium: 1.8 mg/dL (ref 1.7–2.4)

## 2020-11-20 LAB — ABO/RH: ABO/RH(D): B POS

## 2020-11-20 LAB — HEPARIN LEVEL (UNFRACTIONATED): Heparin Unfractionated: 0.68 IU/mL (ref 0.30–0.70)

## 2020-11-20 MED ORDER — LINEZOLID 600 MG PO TABS
600.0000 mg | ORAL_TABLET | Freq: Two times a day (BID) | ORAL | Status: AC
  Administered 2020-11-20 – 2020-11-22 (×5): 600 mg via ORAL
  Filled 2020-11-20 (×7): qty 1

## 2020-11-20 MED ORDER — SODIUM CHLORIDE 0.9 % IV SOLN
1.5000 g | INTRAVENOUS | Status: AC
  Administered 2020-11-21: 1.5 g via INTRAVENOUS
  Filled 2020-11-20: qty 1.5

## 2020-11-20 MED ORDER — TRANEXAMIC ACID (OHS) PUMP PRIME SOLUTION
2.0000 mg/kg | INTRAVENOUS | Status: DC
  Filled 2020-11-20: qty 2.01

## 2020-11-20 MED ORDER — NOREPINEPHRINE 4 MG/250ML-% IV SOLN
0.0000 ug/min | INTRAVENOUS | Status: DC
  Filled 2020-11-20: qty 250

## 2020-11-20 MED ORDER — METOPROLOL TARTRATE 12.5 MG HALF TABLET
12.5000 mg | ORAL_TABLET | Freq: Once | ORAL | Status: AC
  Administered 2020-11-21: 12.5 mg via ORAL
  Filled 2020-11-20: qty 1

## 2020-11-20 MED ORDER — SODIUM CHLORIDE 0.9 % IV SOLN
750.0000 mg | INTRAVENOUS | Status: AC
  Administered 2020-11-21: 750 mg via INTRAVENOUS
  Filled 2020-11-20: qty 750

## 2020-11-20 MED ORDER — INSULIN REGULAR(HUMAN) IN NACL 100-0.9 UT/100ML-% IV SOLN
INTRAVENOUS | Status: AC
  Administered 2020-11-21: 7.5 [IU]/h via INTRAVENOUS
  Administered 2020-11-21: 1 [IU]/h via INTRAVENOUS
  Filled 2020-11-20: qty 100

## 2020-11-20 MED ORDER — SODIUM CHLORIDE 0.9 % IV SOLN
INTRAVENOUS | Status: DC
  Filled 2020-11-20: qty 30

## 2020-11-20 MED ORDER — EPINEPHRINE HCL 5 MG/250ML IV SOLN IN NS
0.0000 ug/min | INTRAVENOUS | Status: DC
  Filled 2020-11-20: qty 250

## 2020-11-20 MED ORDER — TRANEXAMIC ACID 1000 MG/10ML IV SOLN
1.5000 mg/kg/h | INTRAVENOUS | Status: AC
  Administered 2020-11-21: 1 mg/kg/h via INTRAVENOUS
  Filled 2020-11-20: qty 25

## 2020-11-20 MED ORDER — NITROGLYCERIN IN D5W 200-5 MCG/ML-% IV SOLN
2.0000 ug/min | INTRAVENOUS | Status: AC
  Administered 2020-11-21: 4 ug/min via INTRAVENOUS
  Filled 2020-11-20: qty 250

## 2020-11-20 MED ORDER — TRANEXAMIC ACID (OHS) BOLUS VIA INFUSION
15.0000 mg/kg | INTRAVENOUS | Status: AC
  Administered 2020-11-21: 1504.5 mg via INTRAVENOUS
  Filled 2020-11-20: qty 1505

## 2020-11-20 MED ORDER — CHLORHEXIDINE GLUCONATE CLOTH 2 % EX PADS
6.0000 | MEDICATED_PAD | Freq: Once | CUTANEOUS | Status: AC
  Administered 2020-11-21: 6 via TOPICAL

## 2020-11-20 MED ORDER — POTASSIUM CHLORIDE 2 MEQ/ML IV SOLN
80.0000 meq | INTRAVENOUS | Status: DC
  Filled 2020-11-20: qty 40

## 2020-11-20 MED ORDER — MILRINONE LACTATE IN DEXTROSE 20-5 MG/100ML-% IV SOLN
0.3000 ug/kg/min | INTRAVENOUS | Status: DC
  Filled 2020-11-20: qty 100

## 2020-11-20 MED ORDER — VANCOMYCIN HCL 1500 MG/300ML IV SOLN
1500.0000 mg | INTRAVENOUS | Status: AC
  Administered 2020-11-21: 1500 mg via INTRAVENOUS
  Filled 2020-11-20: qty 300

## 2020-11-20 MED ORDER — BISACODYL 5 MG PO TBEC
5.0000 mg | DELAYED_RELEASE_TABLET | Freq: Once | ORAL | Status: AC
  Administered 2020-11-20: 5 mg via ORAL
  Filled 2020-11-20: qty 1

## 2020-11-20 MED ORDER — DEXMEDETOMIDINE HCL IN NACL 400 MCG/100ML IV SOLN
0.1000 ug/kg/h | INTRAVENOUS | Status: AC
  Administered 2020-11-21: .7 ug/kg/h via INTRAVENOUS
  Administered 2020-11-21: 1.2 ug/kg/h via INTRAVENOUS
  Filled 2020-11-20: qty 100

## 2020-11-20 MED ORDER — CHLORHEXIDINE GLUCONATE CLOTH 2 % EX PADS
6.0000 | MEDICATED_PAD | Freq: Once | CUTANEOUS | Status: AC
  Administered 2020-11-20: 6 via TOPICAL

## 2020-11-20 MED ORDER — PHENYLEPHRINE HCL-NACL 20-0.9 MG/250ML-% IV SOLN
30.0000 ug/min | INTRAVENOUS | Status: AC
  Administered 2020-11-21: 20 ug/min via INTRAVENOUS
  Filled 2020-11-20: qty 250

## 2020-11-20 MED ORDER — PLASMA-LYTE 148 IV SOLN
INTRAVENOUS | Status: DC
  Filled 2020-11-20: qty 2.5

## 2020-11-20 MED ORDER — TEMAZEPAM 15 MG PO CAPS: 15.0000 mg | ORAL_CAPSULE | Freq: Once | ORAL | Status: DC | PRN

## 2020-11-20 MED ORDER — MAGNESIUM SULFATE 50 % IJ SOLN
40.0000 meq | INTRAMUSCULAR | Status: DC
  Filled 2020-11-20: qty 9.85

## 2020-11-20 MED ORDER — CHLORHEXIDINE GLUCONATE 0.12 % MT SOLN
15.0000 mL | Freq: Once | OROMUCOSAL | Status: AC
  Administered 2020-11-21: 15 mL via OROMUCOSAL
  Filled 2020-11-20: qty 15

## 2020-11-20 NOTE — Progress Notes (Signed)
CARDIAC REHAB PHASE I   Offered to walk with pt, pt recently returned from ambulating with PT. States he is tired of being in the hospital and ready to go home. Preop education completed with pt. Pt given IS, cardiac surgery booklet, and [facility] care guide. Pt states his wife is wheelchair bound, but has people who can come and help if needed. Provided support and encouragement to pt. Will continue to follow throughout hospital stay.  [place], RN BSN 11/20/2020 2:26 PM

## 2020-11-20 NOTE — Progress Notes (Signed)
Progress Note  Patient Name: NESTA KIMPLE Date of Encounter: 11/20/2020  [facility] Cardiologist: No primary care provider on file.   Subjective   Sitting in chair, just walked around the hallway.  No chest pain, no shortness of breath  Inpatient Medications    Scheduled Meds: . aspirin  81 mg Oral Daily  . atorvastatin  80 mg Oral Daily  . busPIRone  15 mg Oral QPM  . busPIRone  30 mg Oral Q0600  . carvedilol  12.5 mg Oral BID WC  . docusate sodium  100 mg Oral BID  . donepezil  5 mg Oral QHS  . [START ON 11/21/2020] epinephrine  0-10 mcg/min Intravenous To OR  . ezetimibe  10 mg Oral Daily  . FLUoxetine  20 mg Oral Daily  . folic acid  1 mg Oral Daily  . furosemide  40 mg Intravenous BID  . guaiFENesin  600 mg Oral BID  . [START ON 11/21/2020] heparin-papaverine-plasmalyte irrigation   Irrigation To OR  . hydrocerin   Topical Daily  . insulin aspart  0-15 Units Subcutaneous TID WC  . insulin aspart  0-5 Units Subcutaneous QHS  . insulin aspart  4 Units Subcutaneous TID WC  . insulin glargine  15 Units Subcutaneous Daily  . [START ON 11/21/2020] insulin   Intravenous To OR  . irbesartan  300 mg Oral Daily  . linezolid  600 mg Oral Q12H  . loratadine  10 mg Oral Daily  . [START ON 11/21/2020] magnesium sulfate  40 mEq Other To OR  . mouth rinse  15 mL Mouth Rinse BID  . melatonin  3 mg Oral QHS  . metoCLOPramide (REGLAN) injection  5 mg Intravenous Q8H  . multivitamin with minerals  1 tablet Oral Daily  . nutrition supplement (JUVEN)  1 packet Oral BID BM  . pantoprazole  40 mg Oral Daily  . [START ON 11/21/2020] phenylephrine  30-200 mcg/min Intravenous To OR  . [START ON 11/21/2020] potassium chloride  80 mEq Other To OR  . potassium chloride  40 mEq Oral Daily  . Ensure Max Protein  11 oz Oral QHS  . sodium chloride flush  3 mL Intravenous Q12H  . spironolactone  12.5 mg Oral Daily  . terazosin  5 mg Oral QHS  . thiamine  100 mg Oral Daily   Or  . thiamine   100 mg Intravenous Daily  . [START ON 11/21/2020] tranexamic acid  15 mg/kg Intravenous To OR  . [START ON 11/21/2020] tranexamic acid  2 mg/kg Intracatheter To OR   Continuous Infusions: . sodium chloride 10 mL/hr at 11/14/20 0151  . sodium chloride    . [START ON 11/21/2020] cefUROXime (ZINACEF)  IV    . [START ON 11/21/2020] cefUROXime (ZINACEF)  IV    . [START ON 11/21/2020] dexmedetomidine    . [START ON 11/21/2020] heparin 30,000 units/NS 1000 mL solution for CELLSAVER    . heparin 1,850 Units/hr (11/20/20 0813)  . [START ON 11/21/2020] milrinone    . nitroGLYCERIN 5 mcg/min (11/19/20 1636)  . [START ON 11/21/2020] nitroGLYCERIN    . [START ON 11/21/2020] norepinephrine    . [START ON 11/21/2020] tranexamic acid (CYKLOKAPRON) infusion ([facility])    . [START ON 11/21/2020] vancomycin     PRN Meds: sodium chloride, acetaminophen **OR** acetaminophen, acetaminophen, alum & mag hydroxide-simeth, bisacodyl, diphenhydrAMINE, hydrALAZINE, HYDROcodone-acetaminophen, ipratropium-albuterol, morphine injection, nitroGLYCERIN, ondansetron **OR** ondansetron (ZOFRAN) IV, polyethylene glycol, sodium chloride flush, temazepam   Vital Signs  Vitals:   11/20/20 0500 11/20/20 0744 11/20/20 0800 11/20/20 1050  BP:  127/66 (!) 142/73 137/66  Pulse:  71 73 87  Resp:  (!) 9 14 15   Temp:    98.2 F (36.8 C)  TempSrc:    Oral  SpO2:  98% 96% 96%  Weight: 100.3 kg     Height:        Intake/Output Summary (Last 24 hours) at 11/20/2020 1126 Last data filed at 11/20/2020 1059 Gross per 24 hour  Intake 1062.06 ml  Output 4310 ml  Net -3247.94 ml   Last 3 Weights 11/20/2020 11/19/2020 11/18/2020  Weight (lbs) 221 lb 1.9 oz 235 lb 3.7 oz 240 lb 1.3 oz  Weight (kg) 100.3 kg 106.7 kg 108.9 kg      Telemetry    Sinus rhythm, no adverse arrhythmias- Personally Reviewed  ECG    No new- Personally Reviewed  Physical Exam   GEN: No acute distress.   Neck: No JVD Cardiac: RRR, no murmurs, rubs, or gallops.   Respiratory: Clear to auscultation bilaterally. GI: Soft, nontender, non-distended  MS:  Mild edema, erythema improved right lower extremity Neuro:  Nonfocal  Psych: Normal affect   Labs    High Sensitivity Troponin:   Recent Labs  Lab 11/14/20 1656 11/14/20 1916 11/15/20 0802  TROPONINIHS 6,603* 7,382* 4,065*      Chemistry Recent Labs  Lab 11/15/20 0253 11/16/20 8119 11/17/20 0249 11/18/20 0138 11/18/20 1238 11/18/20 1318 11/19/20 0556 11/20/20 0031  NA 130* 135 134* 137   < > 139 136 137  K 5.0 5.1 4.6 4.0   < > 4.6 4.3 4.3  CL 95* 99 98 101  --   --  99 98  CO2 21* 21* 26 24  --   --  26 29  GLUCOSE 324* 264* 188* 57*  --   --  182* 188*  BUN 28* 44* 57* 46*  --   --  41* 38*  CREATININE 1.22 1.15 1.15 0.94  --   --  1.17 1.09  CALCIUM 8.7* 8.9 8.8* 9.1  --   --  8.7* 8.9  PROT 5.9* 5.8*  --   --   --   --   --   --   ALBUMIN 2.2* 2.2* 2.0* 2.2*  --   --   --   --   AST 54* 32  --   --   --   --   --   --   ALT 65* 69*  --   --   --   --   --   --   ALKPHOS 244* 183*  --   --   --   --   --   --   BILITOT 1.0 1.0  --   --   --   --   --   --   GFRNONAA >60 >60 >60 >60  --   --  >60 >60  ANIONGAP 14 15 10 12   --   --  11 10   < > = values in this interval not displayed.     Hematology Recent Labs  Lab 11/18/20 0138 11/18/20 1238 11/18/20 1318 11/19/20 0556 11/20/20 0031  WBC 16.9*  --   --  14.2* 10.8*  RBC 3.54*  --   --  3.19* 3.41*  HGB 11.2*   < > 12.6* 10.5* 11.1*  HCT 33.8*   < > 37.0* 30.1* 32.2*  MCV 95.5  --   --  94.4 94.4  MCH 31.6  --   --  32.9 32.6  MCHC 33.1  --   --  34.9 34.5  RDW 13.8  --   --  13.5 13.4  PLT 409*  --   --  400 410*   < > = values in this interval not displayed.    BNP Recent Labs  Lab 11/14/20 0448  BNP 859.3*     DDimer No results for input(s): DDIMER in the last 168 hours.   Radiology    CT CHEST WO CONTRAST  Result Date: 11/19/2020 CLINICAL DATA:  Aortic disease, preoperative evaluation for CABG  EXAM: CT CHEST WITHOUT CONTRAST TECHNIQUE: Multidetector CT imaging of the chest was performed following the standard protocol without IV contrast. COMPARISON:  11/15/2020, 02/01/2020 FINDINGS: Cardiovascular: Unenhanced imaging of the heart and great vessels demonstrates no pericardial effusion. There is extensive atherosclerosis throughout the coronary vasculature. Normal caliber of the thoracic aorta, with marked atherosclerosis distal aspect aortic arch and descending thoracic aorta. There is moderate calcified plaque along the right lateral aspect of the aortic root. Mediastinum/Nodes: No enlarged mediastinal or axillary lymph nodes. Thyroid gland, trachea, and esophagus demonstrate no significant findings. Lungs/Pleura: Upper lobe predominant emphysema. There are bilateral patchy areas of ground-glass airspace disease, most pronounced in the left lower lobe. The pattern and appearance suggests early edema rather than infection. There are small bilateral pleural effusions, volume estimated less than 100 cc each. 6 mm nodules are seen within the right middle lobe image 99/4, and left lower lobe image 114/4. These are unchanged since prior exam. No new nodules or masses. Central airways are patent. Upper Abdomen: No acute abnormality. 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 There are no acute bony abnormalities. Reconstructed images demonstrate no additional findings. IMPRESSION: 1. Patchy bilateral ground-glass airspace disease and small pleural effusions, suggesting early edema. 2. Extensive atherosclerosis of the aorta and coronary vasculature. 3. Stable 6 mm nodules within the right middle and left lower lobes. Noncontrast CT in 12-18 months from today's exam is considered optional for low-risk patients, but is recommended for high-risk patients. This recommendation follows the consensus statement: Guidelines for Management of Incidental Pulmonary Nodules  Detected on CT Images: From the Fleischner Society 2017; Radiology 2017; 284:228-243. 4. Aortic Atherosclerosis (ICD10-I70.0) and Emphysema (ICD10-J43.9). Electronically Signed   By: Randa Ngo M.D.   On: 11/19/2020 18:42   CARDIAC CATHETERIZATION  Result Date: 11/18/2020  Prox RCA to Mid RCA lesion is 90% stenosed.  Prox RCA lesion is 80% stenosed.  Prox LAD lesion is 95% stenosed.  Mid Cx lesion is 95% stenosed.  Prox Cx lesion is 60% stenosed with 60% stenosed side branch in 1st Mrg.  Mid RCA to Dist RCA lesion is 100% stenosed.  Mid LAD lesion is 80% stenosed.  Mid LAD to Dist LAD lesion is 80% stenosed.  Severe multivessel CAD with evidence for coronary calcification.  The LAD has 95% very proximal stenosis followed by diffuse 80% proximal- mid to mid stenoses; The circumflex vessel has 60% stenosis proximal to a large first marginal branch with 50% mid marginal stenosis and 95% AV groove stenosis. The RCA has diffuse 80% proximal stenosis followed by 90% mid stenosis before being totally occluded proximal to the acute margin.  There are are right to right collaterals supplying a portion of the distal RCA.  There are more extensive left to right collaterals from the circumflex and LAD supplying the PDA and PLA vessels. Elevated right  heart pressures moderate pulmonary hypertension with PA mean pressure 36 mm. RECOMMENDATION: Patient has significant multivessel CAD and will ultimately need surgical consultation for CABG revascularization.  Plan to continue aggressive diuresis with volume overload, treatment for his cellulitis.  Will initiate heparin and IC nitroglycerin.  Aggressive lipid-lowering therapy with target LDL less than 70 and optimal blood pressure control.   VAS US DOPPLER PRE CABG  Result Date: 11/19/2020 PREOPERATIVE VASCULAR EVALUATION  Indications:      Pre-CABG. Risk Factors:     Hypertension, hyperlipidemia. Comparison Study: No prior studies. Performing Technologist:  Carlos Levering RVT  Examination Guidelines: A complete evaluation includes B-mode imaging, spectral Doppler, color Doppler, and power Doppler as needed of all accessible portions of each vessel. Bilateral testing is considered an integral part of a complete examination. Limited examinations for reoccurring indications may be performed as noted.  Right Carotid Findings: +----------+--------+--------+--------+-----------------------+--------+           PSV cm/sEDV cm/sStenosisDescribe               Comments +----------+--------+--------+--------+-----------------------+--------+ CCA Prox  87      8               smooth and heterogenoustortuous +----------+--------+--------+--------+-----------------------+--------+ CCA Distal65      8               smooth and heterogenous         +----------+--------+--------+--------+-----------------------+--------+ ICA Prox  77      18              calcific                        +----------+--------+--------+--------+-----------------------+--------+ ICA Distal119     30                                     tortuous +----------+--------+--------+--------+-----------------------+--------+ ECA       136     2                                               +----------+--------+--------+--------+-----------------------+--------+ Portions of this table do not appear on this page. +----------+--------+-------+--------+------------+           PSV cm/sEDV cmsDescribeArm Pressure +----------+--------+-------+--------+------------+ Subclavian162                                 +----------+--------+-------+--------+------------+ +---------+--------+--+--------+-+---------+ VertebralPSV cm/s43EDV cm/s9Antegrade +---------+--------+--+--------+-+---------+ Left Carotid Findings: +----------+--------+--------+--------+-----------------------+--------+           PSV cm/sEDV cm/sStenosisDescribe               Comments  +----------+--------+--------+--------+-----------------------+--------+ CCA Prox  107     15              smooth and heterogenous         +----------+--------+--------+--------+-----------------------+--------+ CCA Distal60      10              smooth and heterogenous         +----------+--------+--------+--------+-----------------------+--------+ ICA Prox  62      14              calcific                        +----------+--------+--------+--------+-----------------------+--------+  ICA Distal66      20                                     tortuous +----------+--------+--------+--------+-----------------------+--------+ ECA       149     0                                               +----------+--------+--------+--------+-----------------------+--------+ +----------+--------+--------+--------+------------+ SubclavianPSV cm/sEDV cm/sDescribeArm Pressure +----------+--------+--------+--------+------------+           149                                  +----------+--------+--------+--------+------------+ +---------+--------+--+--------+-+---------+ VertebralPSV cm/s25EDV cm/s8Antegrade +---------+--------+--+--------+-+---------+  ABI Findings: +--------+------------------+-----+----------+--------+ Right   Rt Pressure (mmHg)IndexWaveform  Comment  +--------+------------------+-----+----------+--------+ [id number]                    triphasic          +--------+------------------+-----+----------+--------+ PTA     96                0.70 monophasic         +--------+------------------+-----+----------+--------+ DP      105               0.76 monophasic         +--------+------------------+-----+----------+--------+ +--------+------------------+-----+----------+-------+ Left    Lt Pressure (mmHg)IndexWaveform  Comment +--------+------------------+-----+----------+-------+ [id number]                    triphasic          +--------+------------------+-----+----------+-------+ PTA     108               0.78 monophasic        +--------+------------------+-----+----------+-------+ DP      89                0.64 monophasic        +--------+------------------+-----+----------+-------+ +-------+---------------+----------------+ ABI/TBIToday's ABI/TBIPrevious ABI/TBI +-------+---------------+----------------+ Right  0.76                            +-------+---------------+----------------+ Left   0.78                            +-------+---------------+----------------+  Right Doppler Findings: +--------+--------+-----+---------+--------+ Site    PressureIndexDoppler  Comments +--------+--------+-----+---------+--------+ [id number]          triphasic         +--------+--------+-----+---------+--------+ Radial               triphasic         +--------+--------+-----+---------+--------+ Ulnar                triphasic         +--------+--------+-----+---------+--------+  Left Doppler Findings: +--------+--------+-----+---------+--------+ Site    PressureIndexDoppler  Comments +--------+--------+-----+---------+--------+ [id number]          triphasic         +--------+--------+-----+---------+--------+ Radial               triphasic         +--------+--------+-----+---------+--------+ Ulnar  triphasic         +--------+--------+-----+---------+--------+  Summary: Right Carotid: Velocities in the right ICA are consistent with a 1-39% stenosis. Left Carotid: Velocities in the left ICA are consistent with a 1-39% stenosis. Vertebrals: Bilateral vertebral arteries demonstrate antegrade flow. Right ABI: Resting right ankle-brachial index indicates moderate right lower extremity arterial disease. Left ABI: Resting left ankle-brachial index indicates moderate left lower extremity arterial disease. Right Upper Extremity: Doppler waveforms decrease 50% with right  radial compression. Doppler waveform obliterate with right ulnar compression. Left Upper Extremity: Doppler waveforms decrease 50% with left radial compression. Doppler waveforms remain within normal limits with left ulnar compression.  Electronically signed by Monica Martinez MD on 11/19/2020 at 5:37:54 PM.    Final     Cardiac Studies   Dominance: Right    Right Heart Pressures RA: A-wave 16, V wave 15; mean 15 RV: 54/19 PA: 52/20; mean 36 PW A-wave 30, V wave 30 mean 27  Initial AO 140/74 Initial LV 150/50  Pullback: Following good diuresis with urine output 650 cc LV: 140/39 Ao: 147/78  Oxygen saturation the pulmonary artery 69% and in the aorta at 99%. By the Fick method, cardiac output 6.4 L/min and cardiac index 2.9 L/min/m.   PVR: 1.4 WU     1. Left ventricular ejection fraction, by estimation, is 40 to 45%. The  left ventricle has mildly decreased function. The left ventricle  demonstrates regional wall motion abnormalities (see scoring  diagram/findings for description). There is mild left  ventricular hypertrophy. Left ventricular diastolic parameters are  consistent with Grade I diastolic dysfunction (impaired relaxation).  Elevated left atrial pressure.  2. Right ventricular systolic function is normal. The right ventricular  size is normal.  3. The mitral valve is normal in structure. No evidence of mitral valve  regurgitation. No evidence of mitral stenosis.  4. The aortic valve is normal in structure. Aortic valve regurgitation is  not visualized. Mild to moderate aortic valve sclerosis/calcification is  present, without any evidence of aortic stenosis.  5. The inferior vena cava is normal in size with greater than 50%  respiratory variability, suggesting right atrial pressure of 3 mmHg.     Patient Profile     70 y.o. male with severe multivessel coronary artery disease awaiting CABG.  Right lower extremity cellulitis improved  dramatically.  Assessment & Plan    CAD/CABG Non-ST elevation myocardial infarction -Appreciate cardiothoracic team, discussion with Dr. Orvan Seen -Continue with goal-directed therapy, on IV heparin  Ischemic cardiomyopathy, acute systolic heart failure -EF 40 to 45% -Yesterday started back on angiotensin receptor blocker and spironolactone.  Consider Entresto at a later date. -IV Lasix 40 twice a day  Right lower extremity cellulitis -Improved since admission on IV antibiotics.      For questions or updates, please contact [place] Please consult [email or web address] for contact info under        Signed, Candee Furbish, MD  11/20/2020, 11:26 AM

## 2020-11-20 NOTE — Plan of Care (Signed)

## 2020-11-20 NOTE — Progress Notes (Signed)
ABG drawn and pt placed on cpap

## 2020-11-20 NOTE — Progress Notes (Signed)
Occupational Therapy Treatment Patient Details Name: Jeremy Hickman MRN: [id number] DOB: 06-Aug-1951 Today's Date: 11/20/2020    History of present illness Patient is a 70 y/o male who presents with RLE redness, swelling and weeping. Found to have RLE cellulitis, failed outpatient antibiotics. Found to have NSTEMI. PLan for cardiac cath 11/17/20. PMH includes OSA on CPAP, HTN, DM-2, morbid obesity, anxiety and dementia.   OT comments  Pt progressing towards OT goals this session, Pt min guard for transfers and in room mobility with RW. Focus on education for AE and compensatory strategies for LB ADL. Pt will require continued education and practice. Pt very eager to please and so can be a touch impulsive - but credit this to good intentions. OT will continue to follow acutely. And HHOT contines to be essential.    Follow Up Recommendations  Home health OT;Supervision/Assistance - 24 hour    Equipment Recommendations  None recommended by OT    Recommendations for Other Services      Precautions / Restrictions Precautions Precautions: Fall Restrictions Weight Bearing Restrictions: No       Mobility Bed Mobility Overal bed mobility: Needs Assistance Bed Mobility: Supine to Sit     Supine to sit: Min guard;HOB elevated     General bed mobility comments: min guard for safety with coming to EoB  Transfers Overall transfer level: Needs assistance Equipment used: Rolling walker (2 wheeled) Transfers: Sit to/from Stand Sit to Stand: Min guard         General transfer comment: min guard for safety with power up from bed and from [facility]    Balance Overall balance assessment: Needs assistance Sitting-balance support: Feet supported;No upper extremity supported Sitting balance-Leahy Scale: Good     Standing balance support: During functional activity Standing balance-Leahy Scale: Poor Standing balance comment: Requires UE support in standing. Total A for rear pericare                            ADL either performed or assessed with clinical judgement   ADL Overall ADL's : Needs assistance/impaired     Grooming: Wash/dry hands;Wash/dry face;Min guard;Oral care;Standing Grooming Details (indicate cue type and reason): sink level     Lower Body Bathing: Minimal assistance;Sit to/from stand;Cueing for compensatory techniques Lower Body Bathing Details (indicate cue type and reason): educated on AE - long handle sponge     Lower Body Dressing: Minimal assistance;Sit to/from stand;Cueing for compensatory techniques Lower Body Dressing Details (indicate cue type and reason): educated on AE for LB ADL that requires increased balance (and Pt movbing quickly - slightly impulsive - think its more from eager to pleas than anything else) Toilet Transfer: Min guard;RW;Ambulation   Toileting- Water quality scientist and Hygiene: Min guard;Sit to/from stand (standing)       Functional mobility during ADLs: Min guard;Rolling walker General ADL Comments: Pt needs reinforced continued education for compensatory strategies for LB ADL and continued practice with AE     Vision       Perception     Praxis      Cognition Arousal/Alertness: Awake/alert Behavior During Therapy: [facility] for tasks assessed/performed;Impulsive Overall Cognitive Status: History of cognitive impairments - at baseline Area of Impairment: Memory;Following commands;Awareness;Safety/judgement;Problem solving                     Memory: Decreased short-term memory Following Commands: Follows one step commands with increased time;Follows one step commands consistently Safety/Judgement: Decreased awareness  of safety;Decreased awareness of deficits Awareness: Intellectual;Anticipatory Problem Solving: Requires verbal cues;Slow processing;Requires tactile cues General Comments: likely at baseline level, slightly impulsive due to eagerness to please, watch lines        Exercises      Shoulder Instructions       General Comments      Pertinent Vitals/ Pain       Pain Assessment: No/denies pain Pain Intervention(s): Monitored during session;Repositioned  Home Living                                          Prior Functioning/Environment              Frequency  Min 2X/week        Progress Toward Goals  OT Goals(current goals can now be found in the care plan section)  Progress towards OT goals: Progressing toward goals  Acute Rehab OT Goals Patient Stated Goal: to go home soon OT Goal Formulation: With patient Time For Goal Achievement: 12/01/20 Potential to Achieve Goals: Good  Plan Discharge plan remains appropriate;Frequency remains appropriate    Co-evaluation                 AM-PAC OT "6 Clicks" Daily Activity     Outcome Measure   Help from another person eating meals?: None Help from another person taking care of personal grooming?: None Help from another person toileting, which includes using toliet, bedpan, or urinal?: A Little Help from another person bathing (including washing, rinsing, drying)?: A Little Help from another person to put on and taking off regular upper body clothing?: None Help from another person to put on and taking off regular lower body clothing?: A Lot 6 Click Score: 20    End of Session Equipment Utilized During Treatment: Rolling walker  OT Visit Diagnosis: Unsteadiness on feet (R26.81);Muscle weakness (generalized) (M62.81);Other symptoms and signs involving cognitive function   Activity Tolerance Patient tolerated treatment well   Patient Left in chair;with call bell/phone within reach;with bed alarm set   Nurse Communication Mobility status        Time: 6568-1275 OT Time Calculation (min): 24 min  Charges: OT General Charges $OT Visit: 1 Visit OT Treatments $Self Care/Home Management : 23-37 mins  Jesse Sans OTR/L Acute Rehabilitation Services Pager:  [phone] Office: [place] 11/20/2020, 1:42 PM

## 2020-11-20 NOTE — Progress Notes (Signed)
[place] for IV Heparin Indication: chest pain/ACS  Allergies  Allergen Reactions  . Iodinated Diagnostic Agents Hives    Patient Measurements: Height: 5\' 8"  (172.7 cm) Weight: 100.3 kg (221 lb 1.9 oz) IBW/kg (Calculated) : 68.4 Heparin Dosing Weight: 92.5 kg  Vital Signs: Temp: 97.7 F (36.5 C) (02/10 0300) Temp Source: Oral (02/10 0300) BP: 126/56 (02/10 0300) Pulse Rate: 69 (02/10 0300)  Labs: Recent Labs    11/18/20 0138 11/18/20 1238 11/18/20 1318 11/19/20 0556 11/20/20 0031  HGB 11.2*   < > 12.6* 10.5* 11.1*  HCT 33.8*   < > 37.0* 30.1* 32.2*  PLT 409*  --   --  400 410*  HEPARINUNFRC 0.54  --   --  0.38 0.68  CREATININE 0.94  --   --  1.17 1.09   < > = values in this interval not displayed.    Estimated Creatinine Clearance: 71.4 mL/min (by C-G formula based on SCr of 1.09 mg/dL).   Assessment: 69 yr old man admitted on 11/12/20 with R leg cellulitis c/b by NSTEMI. Pt s/p LHC with mvCAD and plans for surgical consult. Heparin restarted 8h post-cath.  Heparin level therapeutic at 0.68 this morning, CBC stable.  Goal of Therapy:  Heparin level 0.3-0.7 units/ml Monitor platelets by anticoagulation protocol: Yes   Plan:  Reduce heparin slightly to 1850 units/h Daily heparin level and CBC   Arrie Senate, PharmD, BCPS, [facility] Clinical Pharmacist Please check AMION for all [facility] numbers 11/20/2020

## 2020-11-20 NOTE — Progress Notes (Signed)
CARDIAC REHAB PHASE I   Went to completed preop education with pt. Pt soundly asleep. Material left at bedside. Will return to complete education as time allows.  Rufina Falco, RN BSN 11/20/2020 1:25 PM

## 2020-11-20 NOTE — Progress Notes (Signed)
PROGRESS NOTE  Jeremy Hickman LNL:[id number] DOB: Jul 13, 1951   PCP: Malachi Pro [name], PA-C  Patient is from: Home. Independently ambulates at baseline.  DOA: 11/12/2020 LOS: 7  Chief complaints: Right leg swelling, redness and weeping  Brief Narrative / Interim history: 70 year old male with PMH of OSA on CPAP, HTN, DM-2, morbid obesity, anxiety and dementia presented to [facility] ED with RLE swelling, redness, weeping, pain and difficulty bearing weight, and admitted for cellulitis after he failed outpatient treatment with p.o. clindamycin. His LE [place] was negative for DVT on 11/11/2020 at [facility].  Initially started on IV ceftriaxone but antibiotics cannulated to cefepime and vancomycin.  The next day, patient developed respiratory distress. CXR without acute finding. BNP elevated to 800.  Respiratory distress improved after brief BiPAP, IV Lasix, DuoNeb and anxiolytics.  TTE showed LVEF of 40 to 45%, G1 DD and RWMA.  Troponin elevated to 6600.  Started on heparin drip and cardiology consulted.  Patient underwent R/LHC on 11/18/2020 that showed multivessel CAD with moderate pHTN. Cardiology consulted CTS for CABG evaluation.  Subjective: Seen and examined earlier this morning.  No major events overnight of this morning.  No complaints either.  He denies chest pain, shortness of breath, GI or UTI symptoms.  Objective: Vitals:   11/20/20 0500 11/20/20 0744 11/20/20 0800 11/20/20 1050  BP:  127/66 (!) 142/73 137/66  Pulse:  71 73 87  Resp:  (!) 9 14 15   Temp:    98.2 F (36.8 C)  TempSrc:    Oral  SpO2:  98% 96% 96%  Weight: 100.3 kg     Height:        Intake/Output Summary (Last 24 hours) at 11/20/2020 1249 Last data filed at 11/20/2020 1059 Gross per 24 hour  Intake 762.66 ml  Output 4310 ml  Net -3547.34 ml   Filed Weights   11/18/20 0500 11/19/20 0437 11/20/20 0500  Weight: 108.9 kg 106.7 kg 100.3 kg    Examination:  GENERAL: No apparent distress.  Nontoxic. HEENT: MMM.   Vision and hearing grossly intact.  NECK: Supple.  No apparent JVD but difficult to assess.  RESP: 100% on BiPAP.  No IWOB.  Fair aeration bilaterally. CVS:  RRR. Heart sounds normal.  ABD/GI/GU: BS+. Abd soft, NTND.  MSK/EXT:  Moves extremities. No apparent deformity.  2+ pitting edema in RLE. SKIN: Mild residual erythema in RLE. NEURO: Awake, alert and oriented appropriately.  No apparent focal neuro deficit. PSYCH: Calm. Normal affect.  Procedures:  11/18/20-R/LHC with multivessel CAD and moderate pulmonary hypertension.  Microbiology summarized: COVID-19 PCR nonreactive. Blood cultures NGTD.  Assessment & Plan: Non-STEMI/multivessel CAD in patient with history of prior MI-R/LHC as above. -On IV heparin, Lipitor 80 mg daily, Zetia and low-dose aspirin per cardiology -Cardiology and CTS following.  Plan for CABG on 2/11.  Acute on chronic combined CHF: TTE as above. BNP 860 (no prior to compare to).  No significant pulmonary edema on CXR.  Has some RLE swelling although in the setting of cellulitis and chronic venous insufficiency.  3.9 L UOP/24 hours.  Renal function is stable. -Cardiology managing-on IV Lasix 40 mg twice daily. -GDMT-on Coreg, Avapro, Aldactone -Closely monitor fluid status, renal functions and electrolytes  Sepsis due to RLE Cellulitis: POA.  Failed outpatient p.o. antibiotics.  Cellulitis improved but persistent swelling. Of note, patient has history of chronic venous insufficiency in RLE. -RLE [place] negative for DVT on 2/1 per care everywhere. -IV vancomycin 2/3>> p.o. Zyvox 2/7>> 2/13 -Cefepime 2/3-2/5>> Unasyn>>  2/9 -Diabetic control -CT right tibia/fibula without contrast  Essential hypertension: Normotensive. -Cardiac meds as above.  Controlled IDDM-1 with hyperglycemia HLD and macrovascular complication: Y0V 3.7%.  Recent Labs  Lab 11/19/20 1145 11/19/20 1617 11/19/20 2117 11/20/20 0645 11/20/20 1051  GLUCAP 257* 200* 176* 201* 165*  -Add Lantus  15 units daily -Continue SSI-moderate -Continue NovoLog AC 4 units -High intensity statin as above -Further adjustment as appropriate  Elevated liver enzymes: Could be due to non-STEMI/sepsis.  Improving. -Continue monitoring  Hyponatremia: Resolved. -Continue monitoring  Dementia without behavioral disturbance: Fairly oriented. -Continue Buspar, Prozac, Restoril, Melatonin, trazodone -Continue Aricept  Anxiety/insomnia: Seems stable now. -Continue home BuSpar, Prozac, trazodone, Restoril and melatonin -Continue as needed Ativan  Alcohol use: Reports drinking 2 beers a day. -Discontinue CIWA monitoring  OSA on CPAP -Continue nightly BiPAP.  Leukocytosis/bandemia: Likely due to cellulitis.  Improving. -Continue monitoring  Morbid obesity -Weight loss should be encouraged -Outpatient PCP/bariatric medicine f/u encouraged Body mass index is 33.62 kg/m. Nutrition Problem: Increased nutrient needs Etiology: acute illness Signs/Symptoms: estimated needs Interventions: MVI,Juven,Premier Protein   DVT prophylaxis:  Subcu Lovenox  Code Status: DNR/DNI Family Communication: Updated patient's wife over the phone on 2/9 Level of care: Progressive Cardiac Status is: Inpatient  Remains inpatient appropriate because:Ongoing diagnostic testing needed not appropriate for outpatient work up, Unsafe d/c plan, IV treatments appropriate due to intensity of illness or inability to take PO and Inpatient level of care appropriate due to severity of illness   Dispo: The patient is from: Home              Anticipated d/c is to: Home              Anticipated d/c date is: > 3 days              Patient currently is not medically stable to d/c.   Difficult to place patient No       Consultants:  Cardiology Cardiothoracic surgery   Sch Meds:  Scheduled Meds: . aspirin  81 mg Oral Daily  . atorvastatin  80 mg Oral Daily  . busPIRone  15 mg Oral QPM  . busPIRone  30 mg Oral  Q0600  . carvedilol  12.5 mg Oral BID WC  . docusate sodium  100 mg Oral BID  . donepezil  5 mg Oral QHS  . [START ON 11/21/2020] epinephrine  0-10 mcg/min Intravenous To OR  . ezetimibe  10 mg Oral Daily  . FLUoxetine  20 mg Oral Daily  . folic acid  1 mg Oral Daily  . furosemide  40 mg Intravenous BID  . guaiFENesin  600 mg Oral BID  . [START ON 11/21/2020] heparin-papaverine-plasmalyte irrigation   Irrigation To OR  . hydrocerin   Topical Daily  . insulin aspart  0-15 Units Subcutaneous TID WC  . insulin aspart  0-5 Units Subcutaneous QHS  . insulin aspart  4 Units Subcutaneous TID WC  . insulin glargine  15 Units Subcutaneous Daily  . [START ON 11/21/2020] insulin   Intravenous To OR  . irbesartan  300 mg Oral Daily  . linezolid  600 mg Oral Q12H  . loratadine  10 mg Oral Daily  . [START ON 11/21/2020] magnesium sulfate  40 mEq Other To OR  . mouth rinse  15 mL Mouth Rinse BID  . melatonin  3 mg Oral QHS  . metoCLOPramide (REGLAN) injection  5 mg Intravenous Q8H  . multivitamin with minerals  1 tablet Oral Daily  .  nutrition supplement (JUVEN)  1 packet Oral BID BM  . pantoprazole  40 mg Oral Daily  . [START ON 11/21/2020] phenylephrine  30-200 mcg/min Intravenous To OR  . [START ON 11/21/2020] potassium chloride  80 mEq Other To OR  . potassium chloride  40 mEq Oral Daily  . Ensure Max Protein  11 oz Oral QHS  . sodium chloride flush  3 mL Intravenous Q12H  . spironolactone  12.5 mg Oral Daily  . terazosin  5 mg Oral QHS  . thiamine  100 mg Oral Daily   Or  . thiamine  100 mg Intravenous Daily  . [START ON 11/21/2020] tranexamic acid  15 mg/kg Intravenous To OR  . [START ON 11/21/2020] tranexamic acid  2 mg/kg Intracatheter To OR   Continuous Infusions: . sodium chloride 10 mL/hr at 11/14/20 0151  . sodium chloride    . [START ON 11/21/2020] cefUROXime (ZINACEF)  IV    . [START ON 11/21/2020] cefUROXime (ZINACEF)  IV    . [START ON 11/21/2020] dexmedetomidine    . [START ON  11/21/2020] heparin 30,000 units/NS 1000 mL solution for CELLSAVER    . heparin 1,850 Units/hr (11/20/20 0813)  . [START ON 11/21/2020] milrinone    . nitroGLYCERIN 5 mcg/min (11/19/20 1636)  . [START ON 11/21/2020] nitroGLYCERIN    . [START ON 11/21/2020] norepinephrine    . [START ON 11/21/2020] tranexamic acid (CYKLOKAPRON) infusion ([facility])    . [START ON 11/21/2020] vancomycin     PRN Meds:.sodium chloride, acetaminophen **OR** acetaminophen, acetaminophen, alum & mag hydroxide-simeth, bisacodyl, diphenhydrAMINE, hydrALAZINE, HYDROcodone-acetaminophen, ipratropium-albuterol, morphine injection, nitroGLYCERIN, ondansetron **OR** ondansetron (ZOFRAN) IV, polyethylene glycol, sodium chloride flush, temazepam  Antimicrobials: Anti-infectives (From admission, onward)   Start     Dose/Rate Route Frequency Ordered Stop   11/21/20 0400  vancomycin (VANCOREADY) IVPB 1500 mg/300 mL        1,500 mg 150 mL/hr over 120 Minutes Intravenous To Surgery 11/20/20 0924 11/22/20 0400   11/21/20 0400  cefUROXime (ZINACEF) 1.5 g in sodium chloride 0.9 % 100 mL IVPB        1.5 g 200 mL/hr over 30 Minutes Intravenous To Surgery 11/20/20 0924 11/22/20 0400   11/21/20 0400  cefUROXime (ZINACEF) 750 mg in sodium chloride 0.9 % 100 mL IVPB        750 mg 200 mL/hr over 30 Minutes Intravenous To Surgery 11/20/20 0924 11/22/20 0400   11/20/20 1000  linezolid (ZYVOX) tablet 600 mg        600 mg Oral Every 12 hours 11/20/20 0811 11/23/20 0959   11/17/20 1115  linezolid (ZYVOX) tablet 600 mg        600 mg Oral Every 12 hours 11/17/20 1023 11/19/20 2206   11/15/20 0800  Ampicillin-Sulbactam (UNASYN) 3 g in sodium chloride 0.9 % 100 mL IVPB        3 g 200 mL/hr over 30 Minutes Intravenous Every 6 hours 11/15/20 0726 11/19/20 2359   11/14/20 1630  vancomycin (VANCOREADY) IVPB 1500 mg/300 mL  Status:  Discontinued        1,500 mg 150 mL/hr over 120 Minutes Intravenous Every 24 hours 11/13/20 1542 11/17/20 1023   11/13/20  2300  cefTRIAXone (ROCEPHIN) 1 g in sodium chloride 0.9 % 100 mL IVPB  Status:  Discontinued        1 g 200 mL/hr over 30 Minutes Intravenous Every 24 hours 11/13/20 1158 11/13/20 1510   11/13/20 1600  vancomycin (VANCOCIN) 2,250 mg in sodium chloride 0.9 %  500 mL IVPB        2,250 mg 250 mL/hr over 120 Minutes Intravenous  Once 11/13/20 1510 11/13/20 1855   11/13/20 1600  ceFEPIme (MAXIPIME) 2 g in sodium chloride 0.9 % 100 mL IVPB  Status:  Discontinued        2 g 200 mL/hr over 30 Minutes Intravenous  Once 11/13/20 1510 11/13/20 1542   11/13/20 1600  ceFEPIme (MAXIPIME) 2 g in sodium chloride 0.9 % 100 mL IVPB  Status:  Discontinued        2 g 200 mL/hr over 30 Minutes Intravenous Every 8 hours 11/13/20 1542 11/15/20 0726   11/13/20 0000  cefTRIAXone (ROCEPHIN) 2 g in sodium chloride 0.9 % 100 mL IVPB        2 g 200 mL/hr over 30 Minutes Intravenous  Once 11/12/20 2352 11/13/20 0042       I have personally reviewed the following labs and images: CBC: Recent Labs  Lab 11/16/20 0608 11/17/20 0249 11/18/20 0138 11/18/20 1238 11/18/20 1315 11/18/20 1318 11/19/20 0556 11/20/20 0031  WBC 20.3* 21.9* 16.9*  --   --   --  14.2* 10.8*  HGB 12.0* 10.8* 11.2* 11.6* 12.2*  12.2* 12.6* 10.5* 11.1*  HCT 34.1* 30.7* 33.8* 34.0* 36.0*  36.0* 37.0* 30.1* 32.2*  MCV 94.5 93.3 95.5  --   --   --  94.4 94.4  PLT 362 379 409*  --   --   --  400 410*   BMP &GFR Recent Labs  Lab 11/15/20 0253 11/16/20 0608 11/17/20 0249 11/18/20 0138 11/18/20 1238 11/18/20 1315 11/18/20 1318 11/19/20 0556 11/20/20 0031  NA 130* 135 134* 137 141 138  138 139 136 137  K 5.0 5.1 4.6 4.0 4.4 4.6  4.6 4.6 4.3 4.3  CL 95* 99 98 101  --   --   --  99 98  CO2 21* 21* 26 24  --   --   --  26 29  GLUCOSE 324* 264* 188* 57*  --   --   --  182* 188*  BUN 28* 44* 57* 46*  --   --   --  41* 38*  CREATININE 1.22 1.15 1.15 0.94  --   --   --  1.17 1.09  CALCIUM 8.7* 8.9 8.8* 9.1  --   --   --  8.7* 8.9  MG  2.2 2.3 2.4 2.2  --   --   --   --  1.8  PHOS 3.6 4.1 4.0 4.1  --   --   --   --   --    Estimated Creatinine Clearance: 71.4 mL/min (by C-G formula based on SCr of 1.09 mg/dL). Liver & Pancreas: Recent Labs  Lab 11/15/20 0253 11/16/20 0608 11/17/20 0249 11/18/20 0138  AST 54* 32  --   --   ALT 65* 69*  --   --   ALKPHOS 244* 183*  --   --   BILITOT 1.0 1.0  --   --   PROT 5.9* 5.8*  --   --   ALBUMIN 2.2* 2.2* 2.0* 2.2*   No results for input(s): LIPASE, AMYLASE in the last 168 hours. No results for input(s): AMMONIA in the last 168 hours. Diabetic: No results for input(s): HGBA1C in the last 72 hours. Recent Labs  Lab 11/19/20 1145 11/19/20 1617 11/19/20 2117 11/20/20 0645 11/20/20 1051  GLUCAP 257* 200* 176* 201* 165*   Cardiac Enzymes: No results for input(s):  CKTOTAL, CKMB, CKMBINDEX, TROPONINI in the last 168 hours. No results for input(s): PROBNP in the last 8760 hours. Coagulation Profile: No results for input(s): INR, PROTIME in the last 168 hours. Thyroid Function Tests: No results for input(s): TSH, T4TOTAL, FREET4, T3FREE, THYROIDAB in the last 72 hours. Lipid Profile: No results for input(s): CHOL, HDL, LDLCALC, TRIG, CHOLHDL, LDLDIRECT in the last 72 hours. Anemia Panel: No results for input(s): VITAMINB12, FOLATE, FERRITIN, TIBC, IRON, RETICCTPCT in the last 72 hours. Urine analysis:    Component Value Date/Time   COLORURINE YELLOW 11/12/2020 1832   APPEARANCEUR CLEAR 11/12/2020 1832   LABSPEC 1.015 11/12/2020 1832   PHURINE 5.0 11/12/2020 1832   GLUCOSEU NEGATIVE 11/12/2020 1832   HGBUR NEGATIVE 11/12/2020 1832   BILIRUBINUR NEGATIVE 11/12/2020 1832   KETONESUR NEGATIVE 11/12/2020 1832   PROTEINUR NEGATIVE 11/12/2020 1832   NITRITE NEGATIVE 11/12/2020 1832   LEUKOCYTESUR NEGATIVE 11/12/2020 1832   Sepsis Labs: Invalid input(s): PROCALCITONIN, [place]  Microbiology: Recent Results (from the past 240 hour(s))  Culture, blood (routine x  2)     Status: None   Collection Time: 11/12/20  9:36 PM   Specimen: BLOOD  Result Value Ref Range Status   Specimen Description   Final    BLOOD Blood Culture adequate volume Performed at [facility], [address] [place], [place] [address]    Special Requests   Final    BOTTLES DRAWN AEROBIC AND ANAEROBIC RIGHT ANTECUBITAL Performed at [facility], [address] [place], [place] [address]    Culture   Final    NO GROWTH 5 DAYS Performed at [facility], [place] [address], [place], [place] [address]    Report Status 11/18/2020 FINAL  Final  SARS CORONAVIRUS 2 (TAT 6-24 HRS) Nasopharyngeal     Status: None   Collection Time: 11/12/20  9:44 PM   Specimen: Nasopharyngeal  Result Value Ref Range Status   SARS Coronavirus 2 NEGATIVE NEGATIVE Final    Comment: (NOTE) SARS-CoV-2 target nucleic acids are NOT DETECTED.  The SARS-CoV-2 RNA is generally detectable in upper and lower respiratory specimens during the acute phase of infection. Negative results do not preclude SARS-CoV-2 infection, do not rule out co-infections with other pathogens, and should not be used as the sole basis for treatment or other patient management decisions. Negative results must be combined with clinical observations, patient history, and epidemiological information. The expected result is Negative.  Fact Sheet for Patients: SugarRoll.be  Fact Sheet for Healthcare Providers: https://www.woods-mathews.com/  This test is not yet approved or cleared by the [place] FDA and  has been authorized for detection and/or diagnosis of SARS-CoV-2 by FDA under an Emergency Use Authorization (EUA). This EUA will remain  in effect (meaning this test can be used) for the duration of the COVID-19 declaration under Se ction 564(b)(1) of the Act, 21 U.S.C. section 360bbb-3(b)(1), unless the authorization is terminated or revoked  sooner.  Performed at [facility], [place] [address] [place], [place] [address]   Culture, blood (routine x 2)     Status: None   Collection Time: 11/12/20 10:11 PM   Specimen: BLOOD  Result Value Ref Range Status   Specimen Description   Final    BLOOD BLOOD RIGHT HAND Performed at [facility], [address] [place], [place] [address]    Special Requests   Final    BOTTLES DRAWN AEROBIC AND ANAEROBIC Blood Culture adequate volume  Performed at [facility], [address], [place], [place] [address]    Culture   Final    NO GROWTH 5 DAYS Performed at [facility], [place] [address] [place], [place] [address]    Report Status 11/18/2020 FINAL  Final  MRSA PCR Screening     Status: None   Collection Time: 11/16/20  3:03 PM   Specimen: Nasal Mucosa; Nasopharyngeal  Result Value Ref Range Status   MRSA by PCR NEGATIVE NEGATIVE Final    Comment:        The GeneXpert MRSA Assay (FDA approved for NASAL specimens only), is one component of a comprehensive MRSA colonization surveillance program. It is not intended to diagnose MRSA infection nor to guide or monitor treatment for MRSA infections. Performed at [facility], [place] [address], [place], [place] [address]   Surgical pcr screen     Status: None   Collection Time: 11/17/20 10:41 PM   Specimen: Nasal Mucosa; Nasal Swab  Result Value Ref Range Status   MRSA, PCR NEGATIVE NEGATIVE Final   Staphylococcus aureus NEGATIVE NEGATIVE Final    Comment: (NOTE) The Xpert SA Assay (FDA approved for NASAL specimens in patients 38 years of age and older), is one component of a comprehensive surveillance program. It is not intended to diagnose infection nor to guide or monitor treatment. Performed at [facility], [place] [address] [place], [place] [address]     Radiology Studies: CT CHEST WO CONTRAST  Result Date: 11/19/2020 CLINICAL DATA:  Aortic disease, preoperative evaluation for  CABG EXAM: CT CHEST WITHOUT CONTRAST TECHNIQUE: Multidetector CT imaging of the chest was performed following the standard protocol without IV contrast. COMPARISON:  11/15/2020, 02/01/2020 FINDINGS: Cardiovascular: Unenhanced imaging of the heart and great vessels demonstrates no pericardial effusion. There is extensive atherosclerosis throughout the coronary vasculature. Normal caliber of the thoracic aorta, with marked atherosclerosis distal aspect aortic arch and descending thoracic aorta. There is moderate calcified plaque along the right lateral aspect of the aortic root. Mediastinum/Nodes: No enlarged mediastinal or axillary lymph nodes. Thyroid gland, trachea, and esophagus demonstrate no significant findings. Lungs/Pleura: Upper lobe predominant emphysema. There are bilateral patchy areas of ground-glass airspace disease, most pronounced in the left lower lobe. The pattern and appearance suggests early edema rather than infection. There are small bilateral pleural effusions, volume estimated less than 100 cc each. 6 mm nodules are seen within the right middle lobe image 99/4, and left lower lobe image 114/4. These are unchanged since prior exam. No new nodules or masses. Central airways are patent. Upper Abdomen: No acute abnormality. 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 There are no acute bony abnormalities. Reconstructed images demonstrate no additional findings. IMPRESSION: 1. Patchy bilateral ground-glass airspace disease and small pleural effusions, suggesting early edema. 2. Extensive atherosclerosis of the aorta and coronary vasculature. 3. Stable 6 mm nodules within the right middle and left lower lobes. Noncontrast CT in 12-18 months from today's exam is considered optional for low-risk patients, but is recommended for high-risk patients. This recommendation follows the consensus statement: Guidelines for Management of Incidental Pulmonary Nodules  Detected on CT Images: From the Fleischner Society 2017; Radiology 2017; 284:228-243. 4. Aortic Atherosclerosis (ICD10-I70.0) and Emphysema (ICD10-J43.9). Electronically Signed   By: Randa Ngo M.D.   On: 11/19/2020 18:42   VAS US DOPPLER PRE CABG  Result Date: 11/19/2020 PREOPERATIVE VASCULAR EVALUATION  Indications:      Pre-CABG. Risk  Factors:     Hypertension, hyperlipidemia. Comparison Study: No prior studies. Performing Technologist: Carlos Levering RVT  Examination Guidelines: A complete evaluation includes B-mode imaging, spectral Doppler, color Doppler, and power Doppler as needed of all accessible portions of each vessel. Bilateral testing is considered an integral part of a complete examination. Limited examinations for reoccurring indications may be performed as noted.  Right Carotid Findings: +----------+--------+--------+--------+-----------------------+--------+           PSV cm/sEDV cm/sStenosisDescribe               Comments +----------+--------+--------+--------+-----------------------+--------+ CCA Prox  87      8               smooth and heterogenoustortuous +----------+--------+--------+--------+-----------------------+--------+ CCA Distal65      8               smooth and heterogenous         +----------+--------+--------+--------+-----------------------+--------+ ICA Prox  77      18              calcific                        +----------+--------+--------+--------+-----------------------+--------+ ICA Distal119     30                                     tortuous +----------+--------+--------+--------+-----------------------+--------+ ECA       136     2                                               +----------+--------+--------+--------+-----------------------+--------+ Portions of this table do not appear on this page. +----------+--------+-------+--------+------------+           PSV cm/sEDV cmsDescribeArm Pressure  +----------+--------+-------+--------+------------+ Subclavian162                                 +----------+--------+-------+--------+------------+ +---------+--------+--+--------+-+---------+ VertebralPSV cm/s43EDV cm/s9Antegrade +---------+--------+--+--------+-+---------+ Left Carotid Findings: +----------+--------+--------+--------+-----------------------+--------+           PSV cm/sEDV cm/sStenosisDescribe               Comments +----------+--------+--------+--------+-----------------------+--------+ CCA Prox  107     15              smooth and heterogenous         +----------+--------+--------+--------+-----------------------+--------+ CCA Distal60      10              smooth and heterogenous         +----------+--------+--------+--------+-----------------------+--------+ ICA Prox  62      14              calcific                        +----------+--------+--------+--------+-----------------------+--------+ ICA Distal66      20                                     tortuous +----------+--------+--------+--------+-----------------------+--------+ ECA       149     0                                               +----------+--------+--------+--------+-----------------------+--------+ +----------+--------+--------+--------+------------+  SubclavianPSV cm/sEDV cm/sDescribeArm Pressure +----------+--------+--------+--------+------------+           149                                  +----------+--------+--------+--------+------------+ +---------+--------+--+--------+-+---------+ VertebralPSV cm/s25EDV cm/s8Antegrade +---------+--------+--+--------+-+---------+  ABI Findings: +--------+------------------+-----+----------+--------+ Right   Rt Pressure (mmHg)IndexWaveform  Comment  +--------+------------------+-----+----------+--------+ [id number]                    triphasic           +--------+------------------+-----+----------+--------+ PTA     96                0.70 monophasic         +--------+------------------+-----+----------+--------+ DP      105               0.76 monophasic         +--------+------------------+-----+----------+--------+ +--------+------------------+-----+----------+-------+ Left    Lt Pressure (mmHg)IndexWaveform  Comment +--------+------------------+-----+----------+-------+ [id number]                    triphasic         +--------+------------------+-----+----------+-------+ PTA     108               0.78 monophasic        +--------+------------------+-----+----------+-------+ DP      89                0.64 monophasic        +--------+------------------+-----+----------+-------+ +-------+---------------+----------------+ ABI/TBIToday's ABI/TBIPrevious ABI/TBI +-------+---------------+----------------+ Right  0.76                            +-------+---------------+----------------+ Left   0.78                            +-------+---------------+----------------+  Right Doppler Findings: +--------+--------+-----+---------+--------+ Site    PressureIndexDoppler  Comments +--------+--------+-----+---------+--------+ [id number]          triphasic         +--------+--------+-----+---------+--------+ Radial               triphasic         +--------+--------+-----+---------+--------+ Ulnar                triphasic         +--------+--------+-----+---------+--------+  Left Doppler Findings: +--------+--------+-----+---------+--------+ Site    PressureIndexDoppler  Comments +--------+--------+-----+---------+--------+ [id number]          triphasic         +--------+--------+-----+---------+--------+ Radial               triphasic         +--------+--------+-----+---------+--------+ Ulnar                triphasic         +--------+--------+-----+---------+--------+  Summary:  Right Carotid: Velocities in the right ICA are consistent with a 1-39% stenosis. Left Carotid: Velocities in the left ICA are consistent with a 1-39% stenosis. Vertebrals: Bilateral vertebral arteries demonstrate antegrade flow. Right ABI: Resting right ankle-brachial index indicates moderate right lower extremity arterial disease. Left ABI: Resting left ankle-brachial index indicates moderate left lower extremity arterial disease. Right Upper Extremity: Doppler waveforms decrease 50% with right radial compression. Doppler waveform obliterate with right ulnar compression. Left Upper Extremity: Doppler waveforms decrease 50% with left radial compression. Doppler waveforms remain within normal  limits with left ulnar compression.  Electronically signed by Monica Martinez MD on 11/19/2020 at 5:37:54 PM.    Final     Gabryelle Whitmoyer T. [place]water  If 7PM-7AM, please contact night-coverage [email or web address] 11/20/2020, 12:49 PM

## 2020-11-20 NOTE — Progress Notes (Signed)
Physical Therapy Treatment Patient Details Name: Jeremy Hickman MRN: [id number] DOB: 04-29-1951 Today's Date: 11/20/2020    History of Present Illness Patient is a 70 y/o male who presents with RLE redness, swelling and weeping. Found to have RLE cellulitis, failed outpatient antibiotics. Found to have NSTEMI. PLan for cardiac cath 11/17/20. PMH includes OSA on CPAP, HTN, DM-2, morbid obesity, anxiety and dementia.    PT Comments    Pt fully participated in session. Pt with improved ambulation distance. Pt with improved standing balance. Pt with slow ambulation but able to remove UE support when standing from RW. Pt continues to be limited by endurance, gait, safety and balance. Pt will be going to OR for CABG 2/11. Pt will continue to benefit from skilled PT to address deficits to maximize independence with functional mobility prior ot discharge.     Follow Up Recommendations  Home health PT;Supervision for mobility/OOB     Equipment Recommendations  Rolling walker with 5" wheels    Recommendations for Other Services       Precautions / Restrictions Precautions Precautions: Fall Restrictions Weight Bearing Restrictions: No    Mobility  Bed Mobility Overal bed mobility: Needs Assistance Bed Mobility: Supine to Sit;Sit to Supine     Supine to sit: HOB elevated;Supervision Sit to supine: Supervision;HOB elevated   General bed mobility comments: min guard for safety with coming to EoB    Transfers Overall transfer level: Needs assistance Equipment used: Rolling walker (2 wheeled) Transfers: Sit to/from Stand Sit to Stand: Supervision         General transfer comment: min guard for safety with power up from bed and from [facility]  Ambulation/Gait Ambulation/Gait assistance: Min guard Gait Distance (Feet): 150 Feet Assistive device: Rolling walker (2 wheeled)   Gait velocity: decreased   General Gait Details: Slow, mildly unsteady gait with use of RW; no pain with WB  through RLE today.   Stairs             Wheelchair Mobility    Modified Rankin (Stroke Patients Only)       Balance Overall balance assessment: Needs assistance Sitting-balance support: Feet supported;No upper extremity supported Sitting balance-Leahy Scale: Good     Standing balance support: During functional activity Standing balance-Leahy Scale: Poor Standing balance comment: performed standing without UE support to wash hands                            Cognition Arousal/Alertness: Awake/alert Behavior During Therapy: WFL for tasks assessed/performed;Impulsive Overall Cognitive Status: History of cognitive impairments - at baseline Area of Impairment: Memory;Following commands;Awareness;Safety/judgement;Problem solving                     Memory: Decreased short-term memory Following Commands: Follows one step commands with increased time;Follows one step commands consistently Safety/Judgement: Decreased awareness of safety;Decreased awareness of deficits Awareness: Intellectual;Anticipatory Problem Solving: Requires verbal cues;Slow processing;Requires tactile cues General Comments: likely at baseline level, slightly impulsive due to eagerness to please, watch lines      Exercises      General Comments        Pertinent Vitals/Pain Pain Assessment: No/denies pain Pain Location: discomfort in leg but didn't give a number Pain Intervention(s): Monitored during session;Repositioned    Home Living                      Prior Function  PT Goals (current goals can now be found in the care plan section) Acute Rehab PT Goals Patient Stated Goal: to go home soon PT Goal Formulation: With patient Time For Goal Achievement: 11/30/20 Potential to Achieve Goals: Good Progress towards PT goals: Progressing toward goals    Frequency    Min 3X/week      PT Plan Current plan remains appropriate    Hickman-evaluation               AM-PAC PT "6 Clicks" Mobility   Outcome Measure  Help needed turning from your back to your side while in a flat bed without using bedrails?: None Help needed moving from lying on your back to sitting on the side of a flat bed without using bedrails?: A Little Help needed moving to and from a bed to a chair (including a wheelchair)?: A Little Help needed standing up from a chair using your arms (e.g., wheelchair or bedside chair)?: A Little Help needed to walk in hospital room?: A Little Help needed climbing 3-5 steps with a railing? : A Lot 6 Click Score: 18    End of Session Equipment Utilized During Treatment: Gait belt Activity Tolerance: Patient tolerated treatment well Patient left: in bed;with call bell/phone within reach Nurse Communication: Mobility status PT Visit Diagnosis: Unsteadiness on feet (R26.81);Muscle weakness (generalized) (M62.81);Other abnormalities of gait and mobility (R26.89)     Time: 2957-4734 PT Time Calculation (min) (ACUTE ONLY): 17 min  Charges:  $Gait Training: 8-22 mins                     Jeremy Hickman, DPT Acute Rehabilitation Services [phone]   Jeremy Hickman 11/20/2020, 2:23 PM

## 2020-11-20 NOTE — Progress Notes (Signed)
Pt. is sleeping at time of spiritual care visit.  The chaplain checked in with the Pt. RN-Ben.  This chaplain will F/U later in the day.

## 2020-11-20 NOTE — Anesthesia Preprocedure Evaluation (Addendum)
Anesthesia Evaluation  Patient identified by MRN, date of birth, ID band Patient awake    Reviewed: Allergy & Precautions, NPO status , Patient's Chart, lab work & pertinent test results  History of Anesthesia Complications Negative for: history of anesthetic complications  Airway Mallampati: III  TM Distance: >3 FB Neck ROM: Full    Dental  (+) Dental Advisory Given, Missing, Partial Upper   Pulmonary former smoker,    Pulmonary exam normal        Cardiovascular hypertension, Pt. on medications and Pt. on home beta blockers + CAD and + Past MI  Normal cardiovascular exam   '22 Carotid US - 1-39% b/l ICAS  '22 Cath -  Prox RCA to Mid RCA lesion is 90% stenosed.  Prox RCA lesion is 80% stenosed.  Prox LAD lesion is 95% stenosed.  Mid Cx lesion is 95% stenosed.  Prox Cx lesion is 60% stenosed with 60% stenosed side branch in 1st Mrg.  Mid RCA to Dist RCA lesion is 100% stenosed.  Mid LAD lesion is 80% stenosed.  Mid LAD to Dist LAD lesion is 80% stenosed.    '22 TTE - EF 40 to 45%. + Regional wall motion abnormalities (see scoring diagram/findings for description). Mild left ventricular hypertrophy. Grade I diastolic dysfunction (impaired relaxation). Mild to moderate aortic valve sclerosis/calcification is present, without any evidence of aortic stenosis.      Neuro/Psych PSYCHIATRIC DISORDERS Dementia negative neurological ROS     GI/Hepatic negative GI ROS, Neg liver ROS,   Endo/Other  diabetes, Type 2, Insulin Dependent Obesity   Renal/GU negative Renal ROS     Musculoskeletal  (+) Arthritis ,   Abdominal   Peds  Hematology  (+) anemia ,   Anesthesia Other Findings Covid test negative   Reproductive/Obstetrics                            Anesthesia Physical Anesthesia Plan  ASA: IV  Anesthesia Plan: General   Post-op Pain Management:    Induction:  Intravenous  PONV Risk Score and Plan: 2 and Treatment may vary due to age or medical condition  Airway Management Planned: Oral ETT  Additional Equipment: Arterial line, CVP, PA Cath, TEE and Ultrasound Guidance Line Placement  Intra-op Plan:   Post-operative Plan: Post-operative intubation/ventilation  Informed Consent: I have reviewed the patients History and Physical, chart, labs and discussed the procedure including the risks, benefits and alternatives for the proposed anesthesia with the patient or authorized representative who has indicated his/her understanding and acceptance.     Dental advisory given  Plan Discussed with: CRNA and Anesthesiologist  Anesthesia Plan Comments: (Possible left radial harvest, A-ine on right side )       Anesthesia Quick Evaluation

## 2020-11-21 ENCOUNTER — Inpatient Hospital Stay (HOSPITAL_COMMUNITY): Payer: HMO

## 2020-11-21 ENCOUNTER — Inpatient Hospital Stay (HOSPITAL_COMMUNITY)
Admission: EM | Disposition: A | Discharge status: Home / Self Care | Payer: Self-pay | Source: Home / Self Care | Attending: Student

## 2020-11-21 ENCOUNTER — Encounter (HOSPITAL_COMMUNITY): Payer: Self-pay | Admitting: Internal Medicine

## 2020-11-21 ENCOUNTER — Inpatient Hospital Stay (HOSPITAL_COMMUNITY): Payer: HMO | Admitting: Anesthesiology

## 2020-11-21 DIAGNOSIS — I25110 Atherosclerotic heart disease of native coronary artery with unstable angina pectoris: Secondary | ICD-10-CM | POA: Diagnosis not present

## 2020-11-21 HISTORY — PX: ENDOVEIN HARVEST OF GREATER SAPHENOUS VEIN: SHX5059

## 2020-11-21 HISTORY — PX: TEE WITHOUT CARDIOVERSION: SHX5443

## 2020-11-21 HISTORY — PX: CORONARY ARTERY BYPASS GRAFT: SHX141

## 2020-11-21 LAB — BASIC METABOLIC PANEL
Anion gap: 8 (ref 5–15)
Anion gap: 8 (ref 5–15)
BUN: 31 mg/dL — ABNORMAL HIGH (ref 8–23)
BUN: 24 mg/dL — ABNORMAL HIGH (ref 8–23)
CO2: 32 mmol/L (ref 22–32)
CO2: 24 mmol/L (ref 22–32)
Calcium: 8.7 mg/dL — ABNORMAL LOW (ref 8.9–10.3)
Calcium: 7.9 mg/dL — ABNORMAL LOW (ref 8.9–10.3)
Chloride: 95 mmol/L — ABNORMAL LOW (ref 98–111)
Chloride: 103 mmol/L (ref 98–111)
Creatinine, Ser: 1.01 mg/dL (ref 0.61–1.24)
Creatinine, Ser: 0.9 mg/dL (ref 0.61–1.24)
GFR, Estimated: >60 mL/min (ref >60)
GFR, Estimated: >60 mL/min (ref >60)
Glucose, Bld: 229 mg/dL — ABNORMAL HIGH (ref 70–99)
Glucose, Bld: 118 mg/dL — ABNORMAL HIGH (ref 70–99)
Potassium: 4 mmol/L (ref 3.5–5.1)
Potassium: 4.6 mmol/L (ref 3.5–5.1)
Sodium: 135 mmol/L (ref 135–145)
Sodium: 135 mmol/L (ref 135–145)

## 2020-11-21 LAB — POCT I-STAT 7, (LYTES, BLD GAS, ICA,H+H)
Acid-Base Excess: 7 mmol/L — ABNORMAL HIGH (ref 0.0–2.0)
Acid-Base Excess: 3 mmol/L — ABNORMAL HIGH (ref 0.0–2.0)
Acid-Base Excess: 3 mmol/L — ABNORMAL HIGH (ref 0.0–2.0)
Acid-Base Excess: 3 mmol/L — ABNORMAL HIGH (ref 0.0–2.0)
Acid-Base Excess: 2 mmol/L (ref 0.0–2.0)
Bicarbonate: 32.9 mmol/L — ABNORMAL HIGH (ref 20.0–28.0)
Bicarbonate: 29.9 mmol/L — ABNORMAL HIGH (ref 20.0–28.0)
Bicarbonate: 29.8 mmol/L — ABNORMAL HIGH (ref 20.0–28.0)
Bicarbonate: 28.5 mmol/L — ABNORMAL HIGH (ref 20.0–28.0)
Bicarbonate: 26.7 mmol/L (ref 20.0–28.0)
Calcium, Ion: 1.27 mmol/L (ref 1.15–1.40)
Calcium, Ion: 1.17 mmol/L (ref 1.15–1.40)
Calcium, Ion: 1.23 mmol/L (ref 1.15–1.40)
Calcium, Ion: 1.19 mmol/L (ref 1.15–1.40)
Calcium, Ion: 1.16 mmol/L (ref 1.15–1.40)
HCT: 33 % — ABNORMAL LOW (ref 39.0–52.0)
HCT: 27 % — ABNORMAL LOW (ref 39.0–52.0)
HCT: 29 % — ABNORMAL LOW (ref 39.0–52.0)
HCT: 31 % — ABNORMAL LOW (ref 39.0–52.0)
HCT: 28 % — ABNORMAL LOW (ref 39.0–52.0)
Hemoglobin: 11.2 g/dL — ABNORMAL LOW (ref 13.0–17.0)
Hemoglobin: 9.2 g/dL — ABNORMAL LOW (ref 13.0–17.0)
Hemoglobin: 9.9 g/dL — ABNORMAL LOW (ref 13.0–17.0)
Hemoglobin: 10.5 g/dL — ABNORMAL LOW (ref 13.0–17.0)
Hemoglobin: 9.5 g/dL — ABNORMAL LOW (ref 13.0–17.0)
O2 Saturation: 100 %
O2 Saturation: 76 %
O2 Saturation: 96 %
O2 Saturation: 97 %
O2 Saturation: 97 %
Patient temperature: 36.8
Patient temperature: 36.7
Patient temperature: 37.2
Potassium: 4.2 mmol/L (ref 3.5–5.1)
Potassium: 3.9 mmol/L (ref 3.5–5.1)
Potassium: 4.3 mmol/L (ref 3.5–5.1)
Potassium: 4.8 mmol/L (ref 3.5–5.1)
Potassium: 4.6 mmol/L (ref 3.5–5.1)
Sodium: 137 mmol/L (ref 135–145)
Sodium: 138 mmol/L (ref 135–145)
Sodium: 139 mmol/L (ref 135–145)
Sodium: 138 mmol/L (ref 135–145)
Sodium: 138 mmol/L (ref 135–145)
TCO2: 34 mmol/L — ABNORMAL HIGH (ref 22–32)
TCO2: 32 mmol/L (ref 22–32)
TCO2: 31 mmol/L (ref 22–32)
TCO2: 30 mmol/L (ref 22–32)
TCO2: 28 mmol/L (ref 22–32)
pCO2 arterial: 52 mmHg — ABNORMAL HIGH (ref 32.0–48.0)
pCO2 arterial: 57.4 mmHg — ABNORMAL HIGH (ref 32.0–48.0)
pCO2 arterial: 53.3 mmHg — ABNORMAL HIGH (ref 32.0–48.0)
pCO2 arterial: 46.5 mmHg (ref 32.0–48.0)
pCO2 arterial: 41.4 mmHg (ref 32.0–48.0)
pH, Arterial: 7.409 (ref 7.350–7.450)
pH, Arterial: 7.324 — ABNORMAL LOW (ref 7.350–7.450)
pH, Arterial: 7.354 (ref 7.350–7.450)
pH, Arterial: 7.394 (ref 7.350–7.450)
pH, Arterial: 7.418 (ref 7.350–7.450)
pO2, Arterial: 386 mmHg — ABNORMAL HIGH (ref 83.0–108.0)
pO2, Arterial: 45 mmHg — ABNORMAL LOW (ref 83.0–108.0)
pO2, Arterial: 91 mmHg (ref 83.0–108.0)
pO2, Arterial: 95 mmHg (ref 83.0–108.0)
pO2, Arterial: 89 mmHg (ref 83.0–108.0)

## 2020-11-21 LAB — CBC
HCT: 32.2 % — ABNORMAL LOW (ref 39.0–52.0)
HCT: 31.1 % — ABNORMAL LOW (ref 39.0–52.0)
HCT: 30.5 % — ABNORMAL LOW (ref 39.0–52.0)
Hemoglobin: 11.2 g/dL — ABNORMAL LOW (ref 13.0–17.0)
Hemoglobin: 9.9 g/dL — ABNORMAL LOW (ref 13.0–17.0)
Hemoglobin: 9.9 g/dL — ABNORMAL LOW (ref 13.0–17.0)
MCH: 33.1 pg (ref 26.0–34.0)
MCH: 31.9 pg (ref 26.0–34.0)
MCH: 32.2 pg (ref 26.0–34.0)
MCHC: 34.8 g/dL (ref 30.0–36.0)
MCHC: 31.8 g/dL (ref 30.0–36.0)
MCHC: 32.5 g/dL (ref 30.0–36.0)
MCV: 95.3 fL (ref 80.0–100.0)
MCV: 100.3 fL — ABNORMAL HIGH (ref 80.0–100.0)
MCV: 99.3 fL (ref 80.0–100.0)
Platelets: 388 10*3/uL (ref 150–400)
Platelets: 292 10*3/uL (ref 150–400)
Platelets: 317 10*3/uL (ref 150–400)
RBC: 3.38 MIL/uL — ABNORMAL LOW (ref 4.22–5.81)
RBC: 3.1 MIL/uL — ABNORMAL LOW (ref 4.22–5.81)
RBC: 3.07 MIL/uL — ABNORMAL LOW (ref 4.22–5.81)
RDW: 13.3 % (ref 11.5–15.5)
RDW: 13.4 % (ref 11.5–15.5)
RDW: 13.4 % (ref 11.5–15.5)
WBC: 10.3 10*3/uL (ref 4.0–10.5)
WBC: 14.4 10*3/uL — ABNORMAL HIGH (ref 4.0–10.5)
WBC: 17.8 10*3/uL — ABNORMAL HIGH (ref 4.0–10.5)
nRBC: 0 % (ref 0.0–0.2)
nRBC: 0 % (ref 0.0–0.2)
nRBC: 0 % (ref 0.0–0.2)

## 2020-11-21 LAB — POCT I-STAT, CHEM 8
BUN: 24 mg/dL — ABNORMAL HIGH (ref 8–23)
BUN: 25 mg/dL — ABNORMAL HIGH (ref 8–23)
BUN: 23 mg/dL (ref 8–23)
BUN: 23 mg/dL (ref 8–23)
BUN: 24 mg/dL — ABNORMAL HIGH (ref 8–23)
BUN: 24 mg/dL — ABNORMAL HIGH (ref 8–23)
Calcium, Ion: 1.3 mmol/L (ref 1.15–1.40)
Calcium, Ion: 1.24 mmol/L (ref 1.15–1.40)
Calcium, Ion: 1.16 mmol/L (ref 1.15–1.40)
Calcium, Ion: 1.18 mmol/L (ref 1.15–1.40)
Calcium, Ion: 1.18 mmol/L (ref 1.15–1.40)
Calcium, Ion: 1.19 mmol/L (ref 1.15–1.40)
Chloride: 97 mmol/L — ABNORMAL LOW (ref 98–111)
Chloride: 98 mmol/L (ref 98–111)
Chloride: 100 mmol/L (ref 98–111)
Chloride: 97 mmol/L — ABNORMAL LOW (ref 98–111)
Chloride: 98 mmol/L (ref 98–111)
Chloride: 100 mmol/L (ref 98–111)
Creatinine, Ser: 0.7 mg/dL (ref 0.61–1.24)
Creatinine, Ser: 0.8 mg/dL (ref 0.61–1.24)
Creatinine, Ser: 0.7 mg/dL (ref 0.61–1.24)
Creatinine, Ser: 0.8 mg/dL (ref 0.61–1.24)
Creatinine, Ser: 0.8 mg/dL (ref 0.61–1.24)
Creatinine, Ser: 0.8 mg/dL (ref 0.61–1.24)
Glucose, Bld: 194 mg/dL — ABNORMAL HIGH (ref 70–99)
Glucose, Bld: 117 mg/dL — ABNORMAL HIGH (ref 70–99)
Glucose, Bld: 102 mg/dL — ABNORMAL HIGH (ref 70–99)
Glucose, Bld: 102 mg/dL — ABNORMAL HIGH (ref 70–99)
Glucose, Bld: 107 mg/dL — ABNORMAL HIGH (ref 70–99)
Glucose, Bld: 111 mg/dL — ABNORMAL HIGH (ref 70–99)
HCT: 33 % — ABNORMAL LOW (ref 39.0–52.0)
HCT: 28 % — ABNORMAL LOW (ref 39.0–52.0)
HCT: 25 % — ABNORMAL LOW (ref 39.0–52.0)
HCT: 25 % — ABNORMAL LOW (ref 39.0–52.0)
HCT: 26 % — ABNORMAL LOW (ref 39.0–52.0)
HCT: 25 % — ABNORMAL LOW (ref 39.0–52.0)
Hemoglobin: 11.2 g/dL — ABNORMAL LOW (ref 13.0–17.0)
Hemoglobin: 9.5 g/dL — ABNORMAL LOW (ref 13.0–17.0)
Hemoglobin: 8.5 g/dL — ABNORMAL LOW (ref 13.0–17.0)
Hemoglobin: 8.5 g/dL — ABNORMAL LOW (ref 13.0–17.0)
Hemoglobin: 8.8 g/dL — ABNORMAL LOW (ref 13.0–17.0)
Hemoglobin: 8.5 g/dL — ABNORMAL LOW (ref 13.0–17.0)
Potassium: 4.2 mmol/L (ref 3.5–5.1)
Potassium: 3.8 mmol/L (ref 3.5–5.1)
Potassium: 3.5 mmol/L (ref 3.5–5.1)
Potassium: 4.3 mmol/L (ref 3.5–5.1)
Potassium: 4.9 mmol/L (ref 3.5–5.1)
Potassium: 4.3 mmol/L (ref 3.5–5.1)
Sodium: 136 mmol/L (ref 135–145)
Sodium: 136 mmol/L (ref 135–145)
Sodium: 137 mmol/L (ref 135–145)
Sodium: 138 mmol/L (ref 135–145)
Sodium: 139 mmol/L (ref 135–145)
Sodium: 139 mmol/L (ref 135–145)
TCO2: 34 mmol/L — ABNORMAL HIGH (ref 22–32)
TCO2: 30 mmol/L (ref 22–32)
TCO2: 32 mmol/L (ref 22–32)
TCO2: 32 mmol/L (ref 22–32)
TCO2: 34 mmol/L — ABNORMAL HIGH (ref 22–32)
TCO2: 30 mmol/L (ref 22–32)

## 2020-11-21 LAB — GLUCOSE, CAPILLARY
Glucose-Capillary: 197 mg/dL — ABNORMAL HIGH (ref 70–99)
Glucose-Capillary: 94 mg/dL (ref 70–99)
Glucose-Capillary: 74 mg/dL (ref 70–99)
Glucose-Capillary: 86 mg/dL (ref 70–99)
Glucose-Capillary: 90 mg/dL (ref 70–99)
Glucose-Capillary: 79 mg/dL (ref 70–99)
Glucose-Capillary: 93 mg/dL (ref 70–99)
Glucose-Capillary: 117 mg/dL — ABNORMAL HIGH (ref 70–99)
Glucose-Capillary: 116 mg/dL — ABNORMAL HIGH (ref 70–99)

## 2020-11-21 LAB — MAGNESIUM
Magnesium: 1.8 mg/dL (ref 1.7–2.4)
Magnesium: 2.8 mg/dL — ABNORMAL HIGH (ref 1.7–2.4)

## 2020-11-21 LAB — PLATELET COUNT: Platelets: 310 10*3/uL (ref 150–400)

## 2020-11-21 LAB — PROTIME-INR
INR: 1.2 (ref 0.8–1.2)
INR: 1.2 (ref 0.8–1.2)
Prothrombin Time: 14.5 seconds (ref 11.4–15.2)
Prothrombin Time: 14.7 seconds (ref 11.4–15.2)

## 2020-11-21 LAB — HEMOGLOBIN A1C
Hgb A1c MFr Bld: 7.1 % — ABNORMAL HIGH (ref 4.8–5.6)
Mean Plasma Glucose: 157.07 mg/dL

## 2020-11-21 LAB — APTT
aPTT: 111 seconds — ABNORMAL HIGH (ref 24–36)
aPTT: 38 seconds — ABNORMAL HIGH (ref 24–36)

## 2020-11-21 LAB — HEPARIN LEVEL (UNFRACTIONATED): Heparin Unfractionated: 0.78 IU/mL — ABNORMAL HIGH (ref 0.30–0.70)

## 2020-11-21 LAB — HEMOGLOBIN AND HEMATOCRIT, BLOOD
HCT: 25.2 % — ABNORMAL LOW (ref 39.0–52.0)
Hemoglobin: 8.4 g/dL — ABNORMAL LOW (ref 13.0–17.0)

## 2020-11-21 LAB — ECHO INTRAOPERATIVE TEE
Height: 68 in
Weight: 3485.03 oz

## 2020-11-21 LAB — C-REACTIVE PROTEIN: CRP: 1.5 mg/dL — ABNORMAL HIGH (ref <1.0)

## 2020-11-21 LAB — PREPARE RBC (CROSSMATCH)

## 2020-11-21 SURGERY — CORONARY ARTERY BYPASS GRAFTING (CABG)
Anesthesia: General | Site: Chest

## 2020-11-21 MED ORDER — SODIUM CHLORIDE 0.9 % IV SOLN: INTRAVENOUS | Status: DC

## 2020-11-21 MED ORDER — DOCUSATE SODIUM 100 MG PO CAPS
200.0000 mg | ORAL_CAPSULE | Freq: Every day | ORAL | Status: DC
  Administered 2020-11-22 – 2020-11-27 (×6): 200 mg via ORAL
  Filled 2020-11-21 (×6): qty 2

## 2020-11-21 MED ORDER — PROPOFOL 10 MG/ML IV BOLUS
INTRAVENOUS | Status: DC | PRN
  Administered 2020-11-21: 50 mg via INTRAVENOUS

## 2020-11-21 MED ORDER — ACETAMINOPHEN 500 MG PO TABS
1000.0000 mg | ORAL_TABLET | Freq: Four times a day (QID) | ORAL | Status: AC
  Administered 2020-11-21 – 2020-11-26 (×17): 1000 mg via ORAL
  Filled 2020-11-21 (×18): qty 2

## 2020-11-21 MED ORDER — POTASSIUM CHLORIDE 10 MEQ/50ML IV SOLN: 10.0000 meq | INTRAVENOUS | Status: DC

## 2020-11-21 MED ORDER — PHENYLEPHRINE 40 MCG/ML (10ML) SYRINGE FOR IV PUSH (FOR BLOOD PRESSURE SUPPORT)
PREFILLED_SYRINGE | INTRAVENOUS | Status: DC | PRN
  Administered 2020-11-21 (×5): 80 ug via INTRAVENOUS

## 2020-11-21 MED ORDER — ALBUMIN HUMAN 5 % IV SOLN
250.0000 mL | INTRAVENOUS | Status: DC | PRN
  Administered 2020-11-21 – 2020-11-22 (×3): 12.5 g via INTRAVENOUS
  Filled 2020-11-21 (×2): qty 250

## 2020-11-21 MED ORDER — NITROGLYCERIN IN D5W 200-5 MCG/ML-% IV SOLN: 0.0000 ug/min | INTRAVENOUS | Status: DC

## 2020-11-21 MED ORDER — MAGNESIUM SULFATE 4 GM/100ML IV SOLN
4.0000 g | Freq: Once | INTRAVENOUS | Status: AC
  Administered 2020-11-21: 4 g via INTRAVENOUS
  Filled 2020-11-21: qty 100

## 2020-11-21 MED ORDER — METOPROLOL TARTRATE 25 MG/10 ML ORAL SUSPENSION: 12.5000 mg | Freq: Two times a day (BID) | ORAL | Status: DC

## 2020-11-21 MED ORDER — ONDANSETRON HCL 4 MG/2ML IJ SOLN
4.0000 mg | Freq: Four times a day (QID) | INTRAMUSCULAR | Status: DC | PRN
  Administered 2020-11-22 – 2020-11-23 (×2): 4 mg via INTRAVENOUS
  Filled 2020-11-21 (×2): qty 2

## 2020-11-21 MED ORDER — SODIUM CHLORIDE 0.9% FLUSH: 3.0000 mL | INTRAVENOUS | Status: DC | PRN

## 2020-11-21 MED ORDER — ATROPINE SULFATE 1 MG/10ML IJ SOSY
PREFILLED_SYRINGE | INTRAMUSCULAR | Status: AC
  Filled 2020-11-21: qty 10

## 2020-11-21 MED ORDER — SODIUM CHLORIDE 0.45 % IV SOLN: INTRAVENOUS | Status: DC | PRN

## 2020-11-21 MED ORDER — ASPIRIN EC 325 MG PO TBEC
325.0000 mg | DELAYED_RELEASE_TABLET | Freq: Every day | ORAL | Status: DC
  Administered 2020-11-22 – 2020-11-27 (×6): 325 mg via ORAL
  Filled 2020-11-21 (×6): qty 1

## 2020-11-21 MED ORDER — ASPIRIN 81 MG PO CHEW: 324.0000 mg | CHEWABLE_TABLET | Freq: Every day | ORAL | Status: DC

## 2020-11-21 MED ORDER — PLASMA-LYTE A IV SOLN: INTRAVENOUS | Status: DC

## 2020-11-21 MED ORDER — STERILE WATER FOR INJECTION IJ SOLN
INTRAMUSCULAR | Status: AC
  Filled 2020-11-21: qty 10

## 2020-11-21 MED ORDER — STERILE WATER FOR INJECTION IJ SOLN
INTRAMUSCULAR | Status: DC | PRN
  Administered 2020-11-21: 10 mL

## 2020-11-21 MED ORDER — LACTATED RINGERS IV SOLN: INTRAVENOUS | Status: DC | PRN

## 2020-11-21 MED ORDER — METOPROLOL TARTRATE 5 MG/5ML IV SOLN: 2.5000 mg | INTRAVENOUS | Status: DC | PRN

## 2020-11-21 MED ORDER — LIDOCAINE 2% (20 MG/ML) 5 ML SYRINGE
INTRAMUSCULAR | Status: DC | PRN
  Administered 2020-11-21: 60 mg via INTRAVENOUS

## 2020-11-21 MED ORDER — ACETAMINOPHEN 160 MG/5ML PO SOLN: 650.0000 mg | Freq: Once | ORAL | Status: DC

## 2020-11-21 MED ORDER — 0.9 % SODIUM CHLORIDE (POUR BTL) OPTIME
TOPICAL | Status: DC | PRN
  Administered 2020-11-21: 5000 mL

## 2020-11-21 MED ORDER — FAMOTIDINE IN NACL 20-0.9 MG/50ML-% IV SOLN
20.0000 mg | Freq: Two times a day (BID) | INTRAVENOUS | Status: DC
  Administered 2020-11-21: 20 mg via INTRAVENOUS
  Filled 2020-11-21: qty 50

## 2020-11-21 MED ORDER — PROPOFOL 10 MG/ML IV BOLUS
INTRAVENOUS | Status: AC
  Filled 2020-11-21: qty 20

## 2020-11-21 MED ORDER — EPHEDRINE SULFATE-NACL 50-0.9 MG/10ML-% IV SOSY
PREFILLED_SYRINGE | INTRAVENOUS | Status: DC | PRN
  Administered 2020-11-21 (×2): 5 mg via INTRAVENOUS

## 2020-11-21 MED ORDER — MIDAZOLAM HCL 2 MG/2ML IJ SOLN
INTRAMUSCULAR | Status: AC
  Administered 2020-11-21: 2 mg via INTRAVENOUS
  Filled 2020-11-21: qty 2

## 2020-11-21 MED ORDER — FENTANYL CITRATE (PF) 100 MCG/2ML IJ SOLN
50.0000 ug | Freq: Once | INTRAMUSCULAR | Status: AC
Start: 2020-11-21 — End: 2020-11-21

## 2020-11-21 MED ORDER — PROTAMINE SULFATE 10 MG/ML IV SOLN
INTRAVENOUS | Status: DC | PRN
  Administered 2020-11-21: 240 mg via INTRAVENOUS
  Administered 2020-11-21: 10 mg via INTRAVENOUS

## 2020-11-21 MED ORDER — VANCOMYCIN HCL 1000 MG IV SOLR
INTRAVENOUS | Status: DC | PRN
  Administered 2020-11-21: 3000 mg

## 2020-11-21 MED ORDER — HEMOSTATIC AGENTS (NO CHARGE) OPTIME
TOPICAL | Status: DC | PRN
  Administered 2020-11-21: 1 via TOPICAL

## 2020-11-21 MED ORDER — MORPHINE SULFATE (PF) 2 MG/ML IV SOLN
1.0000 mg | INTRAVENOUS | Status: DC | PRN
  Administered 2020-11-21 – 2020-11-22 (×4): 2 mg via INTRAVENOUS
  Filled 2020-11-21 (×4): qty 1

## 2020-11-21 MED ORDER — INSULIN REGULAR(HUMAN) IN NACL 100-0.9 UT/100ML-% IV SOLN: INTRAVENOUS | Status: DC

## 2020-11-21 MED ORDER — METOPROLOL TARTRATE 12.5 MG HALF TABLET
12.5000 mg | ORAL_TABLET | Freq: Two times a day (BID) | ORAL | Status: DC
  Administered 2020-11-22 – 2020-11-24 (×4): 12.5 mg via ORAL
  Filled 2020-11-21 (×6): qty 1

## 2020-11-21 MED ORDER — LACTATED RINGERS IV SOLN: INTRAVENOUS | Status: DC

## 2020-11-21 MED ORDER — HEPARIN SODIUM (PORCINE) 1000 UNIT/ML IJ SOLN
INTRAMUSCULAR | Status: DC | PRN
  Administered 2020-11-21: 5000 [IU] via INTRAVENOUS
  Administered 2020-11-21: 30000 [IU] via INTRAVENOUS

## 2020-11-21 MED ORDER — PLASMA-LYTE 148 IV SOLN
INTRAVENOUS | Status: DC | PRN
  Administered 2020-11-21: 500 mL via INTRAVASCULAR

## 2020-11-21 MED ORDER — BISACODYL 5 MG PO TBEC
10.0000 mg | DELAYED_RELEASE_TABLET | Freq: Every day | ORAL | Status: DC
  Administered 2020-11-22 – 2020-11-27 (×4): 10 mg via ORAL
  Filled 2020-11-21 (×4): qty 2

## 2020-11-21 MED ORDER — CHLORHEXIDINE GLUCONATE 0.12 % MT SOLN
15.0000 mL | OROMUCOSAL | Status: AC
  Filled 2020-11-21: qty 15

## 2020-11-21 MED ORDER — PANTOPRAZOLE SODIUM 40 MG PO TBEC
40.0000 mg | DELAYED_RELEASE_TABLET | Freq: Every day | ORAL | Status: DC
  Administered 2020-11-22 – 2020-11-27 (×6): 40 mg via ORAL
  Filled 2020-11-21 (×6): qty 1

## 2020-11-21 MED ORDER — BUPIVACAINE LIPOSOME 1.3 % IJ SUSP
20.0000 mL | INTRAMUSCULAR | Status: AC
  Administered 2020-11-21: 20 mL
  Filled 2020-11-21: qty 20

## 2020-11-21 MED ORDER — CEFUROXIME SODIUM 1.5 G IV SOLR
1.5000 g | Freq: Two times a day (BID) | INTRAVENOUS | Status: AC
  Administered 2020-11-21 – 2020-11-23 (×4): 1.5 g via INTRAVENOUS
  Filled 2020-11-21 (×4): qty 1.5

## 2020-11-21 MED ORDER — FENTANYL CITRATE (PF) 250 MCG/5ML IJ SOLN
INTRAMUSCULAR | Status: AC
  Filled 2020-11-21: qty 5

## 2020-11-21 MED ORDER — FENTANYL CITRATE (PF) 250 MCG/5ML IJ SOLN
INTRAMUSCULAR | Status: AC
  Filled 2020-11-21: qty 25

## 2020-11-21 MED ORDER — VANCOMYCIN HCL 1000 MG IV SOLR
INTRAVENOUS | Status: AC
  Filled 2020-11-21: qty 3000

## 2020-11-21 MED ORDER — DEXTROSE 50 % IV SOLN: 0.0000 mL | INTRAVENOUS | Status: DC | PRN

## 2020-11-21 MED ORDER — SODIUM CHLORIDE 0.9% FLUSH
3.0000 mL | Freq: Two times a day (BID) | INTRAVENOUS | Status: DC
  Administered 2020-11-22 – 2020-11-27 (×11): 3 mL via INTRAVENOUS

## 2020-11-21 MED ORDER — FENTANYL CITRATE (PF) 100 MCG/2ML IJ SOLN
INTRAMUSCULAR | Status: AC
  Administered 2020-11-21: 50 ug via INTRAVENOUS
  Filled 2020-11-21: qty 2

## 2020-11-21 MED ORDER — TRAMADOL HCL 50 MG PO TABS
50.0000 mg | ORAL_TABLET | ORAL | Status: DC | PRN
  Administered 2020-11-23 – 2020-11-24 (×4): 100 mg via ORAL
  Filled 2020-11-21 (×4): qty 2

## 2020-11-21 MED ORDER — FENTANYL CITRATE (PF) 250 MCG/5ML IJ SOLN
INTRAMUSCULAR | Status: DC | PRN
  Administered 2020-11-21: 50 ug via INTRAVENOUS
  Administered 2020-11-21 (×5): 100 ug via INTRAVENOUS
  Administered 2020-11-21: 50 ug via INTRAVENOUS
  Administered 2020-11-21: 150 ug via INTRAVENOUS
  Administered 2020-11-21 (×2): 100 ug via INTRAVENOUS
  Administered 2020-11-21: 50 ug via INTRAVENOUS
  Administered 2020-11-21: 100 ug via INTRAVENOUS
  Administered 2020-11-21: 50 ug via INTRAVENOUS
  Administered 2020-11-21: 100 ug via INTRAVENOUS

## 2020-11-21 MED ORDER — MIDAZOLAM HCL 5 MG/5ML IJ SOLN
INTRAMUSCULAR | Status: DC | PRN
  Administered 2020-11-21: 4 mg via INTRAVENOUS
  Administered 2020-11-21 (×3): 2 mg via INTRAVENOUS

## 2020-11-21 MED ORDER — VANCOMYCIN HCL IN DEXTROSE 1-5 GM/200ML-% IV SOLN: 1000.0000 mg | Freq: Once | INTRAVENOUS | Status: DC

## 2020-11-21 MED ORDER — ROCURONIUM BROMIDE 10 MG/ML (PF) SYRINGE
PREFILLED_SYRINGE | INTRAVENOUS | Status: DC | PRN
  Administered 2020-11-21: 50 mg via INTRAVENOUS
  Administered 2020-11-21: 100 mg via INTRAVENOUS
  Administered 2020-11-21 (×5): 50 mg via INTRAVENOUS

## 2020-11-21 MED ORDER — DEXMEDETOMIDINE (PRECEDEX) IN NS 20 MCG/5ML (4 MCG/ML) IV SYRINGE
PREFILLED_SYRINGE | INTRAVENOUS | Status: AC
  Filled 2020-11-21: qty 5

## 2020-11-21 MED ORDER — NITROGLYCERIN 0.2 MG/ML ON CALL CATH LAB
INTRAVENOUS | Status: DC | PRN
  Administered 2020-11-21 (×2): 20 ug via INTRAVENOUS

## 2020-11-21 MED ORDER — BUPIVACAINE HCL (PF) 0.5 % IJ SOLN
INTRAMUSCULAR | Status: AC
  Filled 2020-11-21: qty 30

## 2020-11-21 MED ORDER — MIDAZOLAM HCL (PF) 10 MG/2ML IJ SOLN
INTRAMUSCULAR | Status: AC
  Filled 2020-11-21: qty 2

## 2020-11-21 MED ORDER — SODIUM CHLORIDE 0.9 % IV SOLN: 250.0000 mL | INTRAVENOUS | Status: DC

## 2020-11-21 MED ORDER — CHLORHEXIDINE GLUCONATE 0.12 % MT SOLN: 15.0000 mL | OROMUCOSAL | Status: AC

## 2020-11-21 MED ORDER — BUPIVACAINE HCL 0.5 % IJ SOLN
INTRAMUSCULAR | Status: DC | PRN
  Administered 2020-11-21: 30 mL

## 2020-11-21 MED ORDER — LACTATED RINGERS IV SOLN: 500.0000 mL | Freq: Once | INTRAVENOUS | Status: DC | PRN

## 2020-11-21 MED ORDER — CHLORHEXIDINE GLUCONATE CLOTH 2 % EX PADS
6.0000 | MEDICATED_PAD | Freq: Every day | CUTANEOUS | Status: DC
  Administered 2020-11-21 – 2020-11-25 (×5): 6 via TOPICAL

## 2020-11-21 MED ORDER — OXYCODONE HCL 5 MG PO TABS
5.0000 mg | ORAL_TABLET | ORAL | Status: DC | PRN
  Administered 2020-11-21: 5 mg via ORAL
  Administered 2020-11-22 – 2020-11-23 (×4): 10 mg via ORAL
  Administered 2020-11-25 (×3): 5 mg via ORAL
  Administered 2020-11-26: 10 mg via ORAL
  Administered 2020-11-26 (×4): 5 mg via ORAL
  Administered 2020-11-27: 10 mg via ORAL
  Administered 2020-11-27 (×2): 5 mg via ORAL
  Filled 2020-11-21: qty 1
  Filled 2020-11-21: qty 2
  Filled 2020-11-21 (×3): qty 1
  Filled 2020-11-21: qty 2
  Filled 2020-11-21 (×4): qty 1
  Filled 2020-11-21 (×4): qty 2
  Filled 2020-11-21 (×2): qty 1

## 2020-11-21 MED ORDER — BISACODYL 10 MG RE SUPP: 10.0000 mg | Freq: Every day | RECTAL | Status: DC

## 2020-11-21 MED ORDER — DEXMEDETOMIDINE HCL IN NACL 400 MCG/100ML IV SOLN
0.1000 ug/kg/h | INTRAVENOUS | Status: DC
  Filled 2020-11-21: qty 100

## 2020-11-21 MED ORDER — PHENYLEPHRINE HCL-NACL 20-0.9 MG/250ML-% IV SOLN
0.0000 ug/min | INTRAVENOUS | Status: DC
  Administered 2020-11-21: 22 ug/min via INTRAVENOUS
  Filled 2020-11-21: qty 250

## 2020-11-21 MED ORDER — MIDAZOLAM HCL 2 MG/2ML IJ SOLN: 2.0000 mg | INTRAMUSCULAR | Status: DC | PRN

## 2020-11-21 MED ORDER — MIDAZOLAM HCL 2 MG/2ML IJ SOLN: 2.0000 mg | Freq: Once | INTRAMUSCULAR | Status: AC

## 2020-11-21 MED ORDER — DEXMEDETOMIDINE HCL IN NACL 400 MCG/100ML IV SOLN: 0.0000 ug/kg/h | INTRAVENOUS | Status: DC

## 2020-11-21 MED ORDER — ACETAMINOPHEN 160 MG/5ML PO SOLN: 1000.0000 mg | Freq: Four times a day (QID) | ORAL | Status: DC

## 2020-11-21 MED ORDER — CHLORHEXIDINE GLUCONATE 0.12 % MT SOLN
OROMUCOSAL | Status: AC
  Administered 2020-11-21: 15 mL via OROMUCOSAL
  Filled 2020-11-21: qty 15

## 2020-11-21 MED ORDER — ACETAMINOPHEN 650 MG RE SUPP: 650.0000 mg | Freq: Once | RECTAL | Status: DC

## 2020-11-21 SURGICAL SUPPLY — 129 items
ADAPTER CARDIO PERF ANTE/RETRO (ADAPTER) ×5 IMPLANT
APPLICATOR TIP COSEAL (VASCULAR PRODUCTS) IMPLANT
APPLIER CLIP 9.375 SM OPEN (CLIP) ×5
BAG DECANTER FOR FLEXI CONT (MISCELLANEOUS) ×5 IMPLANT
BATTERY MAXDRIVER (MISCELLANEOUS) ×5 IMPLANT
BLADE CLIPPER SURG (BLADE) ×5 IMPLANT
BLADE STERNUM SYSTEM 6 (BLADE) ×5 IMPLANT
BLADE SURG 11 STRL SS (BLADE) ×5 IMPLANT
BLADE SURG 15 STRL LF DISP TIS (BLADE) ×4 IMPLANT
BLADE SURG 15 STRL SS (BLADE) ×1
BNDG ELASTIC 4X5.8 VLCR STR LF (GAUZE/BANDAGES/DRESSINGS) ×5 IMPLANT
BNDG ELASTIC 6X5.8 VLCR STR LF (GAUZE/BANDAGES/DRESSINGS) ×5 IMPLANT
BNDG GAUZE ELAST 4 BULKY (GAUZE/BANDAGES/DRESSINGS) ×5 IMPLANT
CANISTER SUCT 3000ML PPV (MISCELLANEOUS) ×5 IMPLANT
CANISTER WOUNDNEG PRESSURE 500 (CANNISTER) ×5 IMPLANT
CATH CPB KIT HENDRICKSON (MISCELLANEOUS) ×5 IMPLANT
CATH RETROPLEGIA CORONARY 14FR (CATHETERS) ×5 IMPLANT
CATH ROBINSON RED A/P 18FR (CATHETERS) ×10 IMPLANT
CLIP APPLIE 9.375 SM OPEN (CLIP) ×4 IMPLANT
CLIP RETRACTION 3.0MM CORONARY (MISCELLANEOUS) ×5 IMPLANT
CLIP VESOCCLUDE LG 6/CT (CLIP) ×5 IMPLANT
CLIP VESOCCLUDE MED 24/CT (CLIP) ×5 IMPLANT
CLIP VESOCCLUDE SM WIDE 24/CT (CLIP) ×15 IMPLANT
COVER MAYO STAND STRL (DRAPES) ×5 IMPLANT
CUFF TOURN SGL QUICK 18X4 (TOURNIQUET CUFF) IMPLANT
CUFF TOURN SGL QUICK 24 (TOURNIQUET CUFF)
CUFF TRNQT CYL 24X4X16.5-23 (TOURNIQUET CUFF) IMPLANT
DERMABOND ADVANCED (GAUZE/BANDAGES/DRESSINGS) ×1
DERMABOND ADVANCED .7 DNX12 (GAUZE/BANDAGES/DRESSINGS) ×4 IMPLANT
DRAIN CHANNEL 28F RND 3/8 FF (WOUND CARE) ×15 IMPLANT
DRAPE CARDIOVASCULAR INCISE (DRAPES) ×1
DRAPE EXTREMITY T 121X128X90 (DISPOSABLE) ×5 IMPLANT
DRAPE HALF SHEET 40X57 (DRAPES) ×5 IMPLANT
DRAPE SLUSH/WARMER DISC (DRAPES) ×5 IMPLANT
DRAPE SRG 135X102X78XABS (DRAPES) ×4 IMPLANT
DRESSING PEEL AND PLAC PRVNA20 (GAUZE/BANDAGES/DRESSINGS) ×4 IMPLANT
DRSG AQUACEL AG ADV 3.5X14 (GAUZE/BANDAGES/DRESSINGS) ×5 IMPLANT
DRSG PEEL AND PLACE PREVENA 20 (GAUZE/BANDAGES/DRESSINGS) ×5
ELECT CAUTERY BLADE 6.4 (BLADE) ×5 IMPLANT
ELECT REM PT RETURN 9FT ADLT (ELECTROSURGICAL) ×10
ELECTRODE REM PT RTRN 9FT ADLT (ELECTROSURGICAL) ×8 IMPLANT
FELT TEFLON 1X6 (MISCELLANEOUS) ×10 IMPLANT
GAUZE SPONGE 4X4 12PLY STRL (GAUZE/BANDAGES/DRESSINGS) ×10 IMPLANT
GEL ULTRASOUND 20GR AQUASONIC (MISCELLANEOUS) ×5 IMPLANT
GLOVE NEODERM STRL 7.5 LF PF (GLOVE) ×12 IMPLANT
GLOVE SURG NEODERM 7.5  LF PF (GLOVE) ×3
GOWN STRL REUS W/ TWL LRG LVL3 (GOWN DISPOSABLE) ×16 IMPLANT
GOWN STRL REUS W/TWL LRG LVL3 (GOWN DISPOSABLE) ×4
GRAFT HEMASHIELD 10MM (Graft) ×1 IMPLANT
GRAFT VASC STRG 30X10STRL (Graft) ×4 IMPLANT
HEMOSTAT POWDER SURGIFOAM 1G (HEMOSTASIS) ×10 IMPLANT
INSERT FOGARTY SM (MISCELLANEOUS) ×5 IMPLANT
INSERT FOGARTY XLG (MISCELLANEOUS) ×5 IMPLANT
INSERT SUTURE HOLDER (MISCELLANEOUS) ×5 IMPLANT
IV ADAPTER SYR DOUBLE MALE LL (MISCELLANEOUS) ×5 IMPLANT
KIT APPLICATOR RATIO 11:1 (KITS) ×5 IMPLANT
KIT BASIN OR (CUSTOM PROCEDURE TRAY) ×5 IMPLANT
KIT SUCTION CATH 14FR (SUCTIONS) ×5 IMPLANT
KIT TURNOVER KIT B (KITS) ×5 IMPLANT
KIT VASOVIEW HEMOPRO 2 VH 4000 (KITS) ×5 IMPLANT
MARKER GRAFT CORONARY BYPASS (MISCELLANEOUS) ×15 IMPLANT
MARKER SKIN DUAL TIP RULER LAB (MISCELLANEOUS) ×5 IMPLANT
NEEDLE 18GX1X1/2 (RX/OR ONLY) (NEEDLE) ×5 IMPLANT
NS IRRIG 1000ML POUR BTL (IV SOLUTION) ×25 IMPLANT
PACK E OPEN HEART (SUTURE) ×5 IMPLANT
PACK OPEN HEART (CUSTOM PROCEDURE TRAY) ×5 IMPLANT
PACK PLATELET PROCEDURE 60 (MISCELLANEOUS) ×5 IMPLANT
PACK SPY-PHI (KITS) IMPLANT
PAD ARMBOARD 7.5X6 YLW CONV (MISCELLANEOUS) ×10 IMPLANT
PAD ELECT DEFIB RADIOL ZOLL (MISCELLANEOUS) ×5 IMPLANT
PENCIL BUTTON HOLSTER BLD 10FT (ELECTRODE) ×5 IMPLANT
PLATE STERNAL 2.3X208 14H 2-PK (Plate) ×5 IMPLANT
POSITIONER HEAD DONUT 9IN (MISCELLANEOUS) ×5 IMPLANT
POWDER SURGICEL 3.0 GRAM (HEMOSTASIS) ×5 IMPLANT
PUNCH AORTIC ROTATE 4.5MM 8IN (MISCELLANEOUS) ×5 IMPLANT
SCREW LOCKING TI 2.3X13MM (Screw) ×30 IMPLANT
SCREW STERNAL 2.3X17MM (Screw) ×10 IMPLANT
SCREW STERNAL LOCK 2.3MM (Screw) ×20 IMPLANT
SEALANT SURG COSEAL 4ML (VASCULAR PRODUCTS) ×5 IMPLANT
SEALANT SURG COSEAL 8ML (VASCULAR PRODUCTS) ×5 IMPLANT
SET CARDIOPLEGIA MPS 5001102 (MISCELLANEOUS) ×5 IMPLANT
SHEARS HARMONIC 9CM CVD (BLADE) IMPLANT
STAPLER VISISTAT 35W (STAPLE) ×5 IMPLANT
STOCKINETTE 6  STRL (DRAPES) ×1
STOCKINETTE 6 STRL (DRAPES) ×4 IMPLANT
STOPCOCK 4 WAY LG BORE MALE ST (IV SETS) ×5 IMPLANT
SUT BONE WAX W31G (SUTURE) ×5 IMPLANT
SUT ETHILON 3 0 FSL (SUTURE) ×5 IMPLANT
SUT MNCRL AB 3-0 PS2 18 (SUTURE) ×10 IMPLANT
SUT MNCRL AB 4-0 PS2 18 (SUTURE) ×5 IMPLANT
SUT PDS AB 1 CTX 36 (SUTURE) ×10 IMPLANT
SUT PROLENE 3 0 SH DA (SUTURE) ×15 IMPLANT
SUT PROLENE 5 0 C 1 36 (SUTURE) ×10 IMPLANT
SUT PROLENE 6 0 C 1 30 (SUTURE) ×20 IMPLANT
SUT PROLENE 7 0 BV 1 (SUTURE) ×5 IMPLANT
SUT PROLENE 7.0 RB 3 (SUTURE) ×10 IMPLANT
SUT PROLENE 8 0 BV175 6 (SUTURE) IMPLANT
SUT PROLENE BLUE 7 0 (SUTURE) ×5 IMPLANT
SUT SILK  1 MH (SUTURE) ×1
SUT SILK 1 MH (SUTURE) ×4 IMPLANT
SUT SILK 2 0 SH CR/8 (SUTURE) IMPLANT
SUT SILK 3 0 SH CR/8 (SUTURE) IMPLANT
SUT STEEL 6MS V (SUTURE) ×5 IMPLANT
SUT STEEL SZ 6 DBL 3X14 BALL (SUTURE) ×5 IMPLANT
SUT VIC AB 2-0 CT1 27 (SUTURE) ×2
SUT VIC AB 2-0 CT1 TAPERPNT 27 (SUTURE) ×8 IMPLANT
SUT VIC AB 2-0 CTX 27 (SUTURE) IMPLANT
SUT VIC AB 3-0 SH 27 (SUTURE)
SUT VIC AB 3-0 SH 27X BRD (SUTURE) IMPLANT
SUT VIC AB 3-0 X1 27 (SUTURE) IMPLANT
SYR 10ML LL (SYRINGE) IMPLANT
SYR 30ML LL (SYRINGE) ×5 IMPLANT
SYR 3ML LL SCALE MARK (SYRINGE) ×5 IMPLANT
SYR 50ML SLIP (SYRINGE) IMPLANT
SYSTEM SAHARA CHEST DRAIN ATS (WOUND CARE) ×5 IMPLANT
TAPE CLOTH SURG 4X10 WHT LF (GAUZE/BANDAGES/DRESSINGS) ×10 IMPLANT
TAPE PAPER 2X10 WHT MICROPORE (GAUZE/BANDAGES/DRESSINGS) ×5 IMPLANT
TIP DUAL SPRAY TOPICAL (TIP) ×10 IMPLANT
TOWEL GREEN STERILE (TOWEL DISPOSABLE) ×5 IMPLANT
TOWEL GREEN STERILE FF (TOWEL DISPOSABLE) ×5 IMPLANT
TRAY CATH LUMEN 1 20CM STRL (SET/KITS/TRAYS/PACK) ×5 IMPLANT
TRAY FOLEY SLVR 16FR TEMP STAT (SET/KITS/TRAYS/PACK) ×5 IMPLANT
TUBE CONNECTING 20X1/4 (TUBING) ×5 IMPLANT
TUBING ART PRESS 48 MALE/FEM (TUBING) ×20 IMPLANT
TUBING LAP HI FLOW INSUFFLATIO (TUBING) ×5 IMPLANT
UNDERPAD 30X36 HEAVY ABSORB (UNDERPADS AND DIAPERS) ×5 IMPLANT
WATER STERILE IRR 1000ML POUR (IV SOLUTION) ×10 IMPLANT
WATER STERILE IRR 1000ML UROMA (IV SOLUTION) IMPLANT
YANKAUER SUCT BULB TIP NO VENT (SUCTIONS) ×5 IMPLANT

## 2020-11-21 NOTE — Transfer of Care (Signed)
Immediate Anesthesia Transfer of Care Note  Patient: Jeremy Hickman  Procedure(s) Performed: CORONARY ARTERY BYPASS GRAFTING (CABG) TIMES FOUR ON PUMP USING BILATERAL INTERNAL MAMMARY ARTERIES AND ENDOSCOPICALLY HARVESTED LEFT GREATER SAPHENOUS VEIN (N/A Chest) TRANSESOPHAGEAL ECHOCARDIOGRAM (TEE) (N/A ) INDOCYANINE GREEN FLUORESCENCE IMAGING (ICG) (N/A ) ENDOVEIN HARVEST OF GREATER SAPHENOUS VEIN (Left )  Patient Location: ICU  Anesthesia Type:General  Level of Consciousness: Patient remains intubated per anesthesia plan  Airway & Oxygen Therapy: Patient remains intubated per anesthesia plan and Patient placed on Ventilator (see vital sign flow sheet for setting)  Post-op Assessment: Report given to RN and Post -op Vital signs reviewed and stable  Post vital signs: Reviewed and stable  Last Vitals:  Vitals Value Taken Time  BP 126/59 11/21/20 1515  Temp    Pulse 89 11/21/20 1525  Resp 11 11/21/20 1525  SpO2 96 % 11/21/20 1525  Vitals shown include unvalidated device data.  Last Pain:  Vitals:   11/21/20 0306  TempSrc: Oral  PainSc:          Complications: No complications documented.

## 2020-11-21 NOTE — Hospital Course (Addendum)
History of Present Illness:  [name] is a 70 year old male with past medical history significant for type 1 diabetes mellitus, hypertension, obesity, dyslipidemia, [name], [name], in [facility].  He developed pain, redness, and swelling in his right lower extremity and sought medical care in the emergency room at [facility] on 11/10/2020.  He underwent lower extremity ultrasound that ruled out DVT.  He was treated empirically with oral antibiotics but did not see any improvement.  For this reason, he presented to med [facility] with the same complaint.  Since he was felt to have failed outpatient management, decision was made to admit [name] to the hospital for IV antibiotics for cellulitis.  He was admitted on 11/13/2020.  Blood cultures were obtained that were negative.  The antibiotics were initiated.  On the following day, he developed wheezing and shortness of breath.  The primary care team initiated a work-up that included a BN P elevated 859 and high-sensitivity troponin elevated in excess of 6600.  The patient was started on heparin drip for suspected acute coronary syndrome.  An echocardiogram was obtained that showed an ejection fraction of 40 to 45% with regional wall motion abnormalities.  Please see the full note below.  The cardiology service was consulted.  Acute non-ST elevation myocardial infarction was confirmed.  Repeat left heart catheterization was recommended.  Patient was taken to the Cath Lab yesterday where left heart catheterization demonstrated severe three-vessel coronary artery disease.  Please see the full report below. CT surgery has been consulted for consideration of surgical revascularization. Mr. [name] has diagnosis of early dementia.  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, [name],000 at the time of admission to 14,000 today.  He is afebrile and blood cultures obtained at the time of admission have remained negative.  He was evaluated by Dr. Orvan Seen who felt surgical intervention would be the best treatment option.  The risks and benefits of the procedure were explained to the patient and he was agreeable to proceed.  Hospital course:  The patient remained on ABX for his RLE cellulitis.  He was taken to the operating room on 11/21/2020.  He underwent CABG x 5 utilizing LIMA to LAD, SVG to Diagonal, Sequential SVG to PDA and PL, and Free RIMA to OM.  He also underwent endoscopic harvest of greater saphenous vein from his left leg.  He tolerated the procedure without difficulty and was taken to the SICU in stable condition.  The patient was extubated the evening of surgery.  During his stay in the SICU the patient was weaned off Neo-synephrine as tolerated.  His chest tubes and arterial lines were removed without difficulty.  He was started on diuretics for mild volume overload state.  The patient's insulin regimen was adjusted for better glucose control.  The patient was maintaining NSR and was felt stable for transfer to the progressive care unit on 11/24/2020.  He completed his course of ABX for his RLE cellulitis he had preoperatively.    The patient was mildly hypertensive.  He was taken  off Lopressor and placed on his home regimen of Coreg.  He was in NSR and his pacing wires were removed without difficulty.  The patient remained significantly volume overloaded.  He was treated with a dose of Metolazone in addition to his IV Lasix.  He responded well to diuresis.  TED hose were also placed to help with LE edema.  His  blood sugars have remained elevated during his stay and his insulin regimens were titrated accordingly.  The patient continues to work with PT/OT who have recommended home therapy.  This has been arranged.  His surgical incisions are healing without evidence of infection.  He is ambulating without significant difficulty.  His RLE cellulitis is resolved.  His surgical incisions are healing without evidence of infection.  He is medically stable for discharge home today.

## 2020-11-21 NOTE — H&P (Signed)
History and Physical Interval Note:  11/21/2020 9:01 AM  Jeremy Hickman  has presented today for surgery, with the diagnosis of CAD.  The various methods of treatment have been discussed with the patient and family. After consideration of risks, benefits and other options for treatment, the patient has consented to  Procedure(s): CORONARY ARTERY BYPASS GRAFTING (CABG) (N/A) POSSIBLE RADIAL ARTERY HARVEST (Left) TRANSESOPHAGEAL ECHOCARDIOGRAM (TEE) (N/A) INDOCYANINE GREEN FLUORESCENCE IMAGING (ICG) (N/A) as a surgical intervention.  The patient's history has been reviewed, patient examined, no change in status, stable for surgery.  I have reviewed the patient's chart and labs.  Questions were answered to the patient's satisfaction.     Wonda Olds

## 2020-11-21 NOTE — Anesthesia Postprocedure Evaluation (Signed)
Anesthesia Post Note  Patient: Jeremy Hickman  Procedure(s) Performed: CORONARY ARTERY BYPASS GRAFTING (CABG) TIMES FOUR ON PUMP USING BILATERAL INTERNAL MAMMARY ARTERIES AND ENDOSCOPICALLY HARVESTED LEFT GREATER SAPHENOUS VEIN (N/A Chest) TRANSESOPHAGEAL ECHOCARDIOGRAM (TEE) (N/A ) INDOCYANINE GREEN FLUORESCENCE IMAGING (ICG) (N/A ) ENDOVEIN HARVEST OF GREATER SAPHENOUS VEIN (Left )     Patient location during evaluation: ICU Anesthesia Type: General Level of consciousness: sedated and patient remains intubated per anesthesia plan Pain management: pain level controlled Vital Signs Assessment: post-procedure vital signs reviewed and stable Respiratory status: patient remains intubated per anesthesia plan Cardiovascular status: stable Postop Assessment: no apparent nausea or vomiting Anesthetic complications: no   No complications documented.  Last Vitals:  Vitals:   11/21/20 0811 11/21/20 1515  BP: (!) 107/59 (!) 126/59  Pulse: 65 90  Resp: 18 12  Temp:    SpO2: 99% 98%    Last Pain:  Vitals:   11/21/20 0306  TempSrc: Oral  PainSc:                  Audry Pili

## 2020-11-21 NOTE — Procedures (Signed)
Extubation Procedure Note  Patient Details:   Name: Jeremy Hickman DOB: 12/04/1950 MRN: [id number]   Airway Documentation:    Vent end date: 11/21/20 Vent end time: 1852   Evaluation  O2 sats: stable throughout Complications: No apparent complications Patient did tolerate procedure well. Bilateral Breath Sounds: Clear,Diminished   Yes  RT extubated patient to 4L [place] per MD order and rapid wean protocol with RN at bedside. Positive cuff leak noted. NIF -40 and VC 1020. Patient tolerated well and no stridor noted at this time. RT will continue to monitor as needed.    Fabiola Backer 11/21/2020, 7:11 PM

## 2020-11-21 NOTE — Progress Notes (Signed)
   Off to bypass surgery.  We will continue to follow along.  Candee Furbish, MD

## 2020-11-21 NOTE — Anesthesia Procedure Notes (Signed)
Central Venous Catheter Insertion Performed by: Audry Pili, MD, anesthesiologist Start/End2/08/2021 8:26 AM, 11/21/2020 8:31 AM Patient location: Pre-op. Preanesthetic checklist: patient identified, IV checked, risks and benefits discussed, surgical consent, monitors and equipment checked, pre-op evaluation, timeout performed and anesthesia consent Position: Trendelenburg Hand hygiene performed  and maximum sterile barriers used  Total catheter length 10. PA cath was placed.Swan type:thermodilution PA Cath depth:48 Procedure performed without using ultrasound guided technique. Attempts: 1 Patient tolerated the procedure well with no immediate complications.

## 2020-11-21 NOTE — Brief Op Note (Signed)
11/12/2020 - 11/21/2020  1:44 PM  PATIENT:  Jeremy Hickman  70 y.o. male  PRE-OPERATIVE DIAGNOSIS:  Coronary Artery Disease  POST-OPERATIVE DIAGNOSIS:  Coronary Artery Disease  PROCEDURE:  Procedure(s):  CORONARY ARTERY BYPASS GRAFTING x 5 -LIMA to LAD -SVG to DIAGONAL -SEQ SVG to PDA, PLB -FREE RIMA to OM  ENDOSCOPIC HARVEST GREATER SAPHENOUS VEIN -Left Thigh  TRANSESOPHAGEAL ECHOCARDIOGRAM (TEE) (N/A)  INDOCYANINE GREEN FLUORESCENCE IMAGING (ICG) (N/A)   SURGEON:  Surgeon(s) and Role:    * Wonda Olds, MD - Primary  PHYSICIAN ASSISTANT: Ellwood Handler PA-C  ANESTHESIA:   general  EBL:  Per anes record   BLOOD ADMINISTERED: CELLSAVER  DRAINS: Left and Right Pleural Chest Tube, Mediastinal Chest Drains   LOCAL MEDICATIONS USED:  NONE  SPECIMEN:  No Specimen  DISPOSITION OF SPECIMEN:  N/A  COUNTS:  YES  TOURNIQUET:  * No tourniquets in log *  DICTATION: .Dragon Dictation  PLAN OF CARE: Admit to inpatient   PATIENT DISPOSITION:  ICU - intubated and hemodynamically stable.   Delay start of Pharmacological VTE agent (>24hrs) due to surgical blood loss or risk of bleeding: yes

## 2020-11-21 NOTE — Anesthesia Procedure Notes (Signed)
Procedure Name: Intubation Date/Time: 11/21/2020 9:43 AM Performed by: Griffin Dakin, CRNA Pre-anesthesia Checklist: Patient identified, Emergency Drugs available, Suction available and Patient being monitored Patient Re-evaluated:Patient Re-evaluated prior to induction Oxygen Delivery Method: Circle system utilized Preoxygenation: Pre-oxygenation with 100% oxygen Induction Type: IV induction Ventilation: Two handed mask ventilation required and Oral airway inserted - appropriate to patient size Laryngoscope Size: Mac and 4 Grade View: Grade II Tube type: Oral Tube size: 8.0 mm Number of attempts: 1 Airway Equipment and Method: Stylet and Oral airway Placement Confirmation: ETT inserted through vocal cords under direct vision,  positive ETCO2 and breath sounds checked- equal and bilateral Secured at: 23 cm Tube secured with: Tape Dental Injury: Teeth and Oropharynx as per pre-operative assessment

## 2020-11-21 NOTE — Progress Notes (Signed)
After patient was educated about arterial sheath pull and its potential complication, left femoral arterial sheath was pulled at 2128 and pressure held for 20 minutes until hemostasis was achieved. There was a dopplerable dorsal pedalis and posterior tibial pulses present  through out.  Site is a level one with no  signs of hematoma noted.

## 2020-11-21 NOTE — Anesthesia Procedure Notes (Addendum)
Arterial Line Insertion Start/End2/08/2021 8:30 AM, 11/21/2020 8:30 AM Performed by: Audry Pili, MD, Mariea Clonts, CRNA  Patient location: Pre-op. Preanesthetic checklist: patient identified, IV checked, site marked, risks and benefits discussed, surgical consent, monitors and equipment checked, pre-op evaluation, timeout performed and anesthesia consent Lidocaine 1% used for infiltration Right, radial was placed Catheter size: 20 G Hand hygiene performed  and maximum sterile barriers used   Attempts: 1 Procedure performed without using ultrasound guided technique. Following insertion, dressing applied and Biopatch. Post procedure assessment: normal and unchanged  Patient tolerated the procedure well with no immediate complications.

## 2020-11-21 NOTE — Anesthesia Procedure Notes (Signed)
Central Venous Catheter Insertion Performed by: Audry Pili, MD, anesthesiologist Start/End2/08/2021 8:17 AM, 11/21/2020 8:31 AM Patient location: Pre-op. Preanesthetic checklist: patient identified, IV checked, risks and benefits discussed, surgical consent, monitors and equipment checked, pre-op evaluation, timeout performed and anesthesia consent Position: Trendelenburg Lidocaine 1% used for infiltration and patient sedated Hand hygiene performed , maximum sterile barriers used  and Seldinger technique used Catheter size: 8.5 Fr Central line was placed.MAC introducer Procedure performed using ultrasound guided technique. Ultrasound Notes:anatomy identified, needle tip was noted to be adjacent to the nerve/plexus identified, no ultrasound evidence of intravascular and/or intraneural injection and image(s) printed for medical record Attempts: 1 Following insertion, line sutured, dressing applied and Biopatch. Post procedure assessment: blood return through all ports, no air and free fluid flow  Patient tolerated the procedure well with no immediate complications.

## 2020-11-21 NOTE — Progress Notes (Signed)
T[facility] BRIEF SICU PROGRESS NOTE  Day of Surgery  S/P Procedure(s) (LRB): CORONARY ARTERY BYPASS GRAFTING (CABG) TIMES FOUR ON PUMP USING BILATERAL INTERNAL MAMMARY ARTERIES AND ENDOSCOPICALLY HARVESTED LEFT GREATER SAPHENOUS VEIN (N/A) TRANSESOPHAGEAL ECHOCARDIOGRAM (TEE) (N/A) INDOCYANINE GREEN FLUORESCENCE IMAGING (ICG) (N/A) ENDOVEIN HARVEST OF GREATER SAPHENOUS VEIN (Left)   Sedated on vent.  Starting to wake NSR w/ stable hemodynamics on low dose Neo O2 sats 98-100% Chest tube output low UOP adequate Labs okay  Plan: Continue routine early postop  Jeremy Alberts, MD 11/21/2020 6:06 PM

## 2020-11-21 NOTE — Progress Notes (Signed)
PROGRESS NOTE  Jeremy Hickman:[id number] DOB: 1951/06/22   PCP: Malachi Pro [name], PA-C  Patient is from: Home. Independently ambulates at baseline.  DOA: 11/12/2020 LOS: 8  Chief complaints: Right leg swelling, redness and weeping  Brief Narrative / Interim history: 70 year old male with PMH of OSA on CPAP, HTN, DM-2, morbid obesity, anxiety and dementia presented to [facility] ED with RLE swelling, redness, weeping, pain and difficulty bearing weight, and admitted for cellulitis after he failed outpatient treatment with p.o. clindamycin. His LE [place] was negative for DVT on 11/11/2020 at [facility].  Initially started on IV ceftriaxone but antibiotics cannulated to cefepime and vancomycin.  The next day, patient developed respiratory distress. CXR without acute finding. BNP elevated to 800.  Respiratory distress improved after brief BiPAP, IV Lasix, DuoNeb and anxiolytics.  TTE showed LVEF of 40 to 45%, G1 DD and RWMA.  Troponin elevated to 6600.  Started on heparin drip and cardiology consulted.  Patient underwent R/LHC on 11/18/2020 that showed multivessel CAD with moderate pHTN.  Plan for CABG today.  Subjective: Patient on the floor for bypass surgery.  No major events overnight of this morning.  Vital signs and labs stable.  About 3.5 L urine output/24 hours.  Objective: Vitals:   11/21/20 0801 11/21/20 0806 11/21/20 0809 11/21/20 0811  BP: (!) 125/58 133/61  (!) 107/59  Pulse: 63 64  65  Resp: 19 18  18   Temp:      TempSrc:      SpO2: 100% 99%  99%  Weight:   98.8 kg   Height:   5\' 8"  (1.727 m)     Intake/Output Summary (Last 24 hours) at 11/21/2020 1506 Last data filed at 11/21/2020 1420 Gross per 24 hour  Intake 3195.39 ml  Output 3240 ml  Net -44.61 ml   Filed Weights   11/20/20 0500 11/21/20 0543 11/21/20 0809  Weight: 100.3 kg 98.8 kg 98.8 kg    Examination:  Patient off the floor to bypass surgery.  I did not get a chance to examine patient.  Procedures:   11/18/20-R/LHC with multivessel CAD and moderate pulmonary hypertension.  Microbiology summarized: COVID-19 PCR nonreactive. Blood cultures NGTD.  Assessment & Plan: Non-STEMI/multivessel CAD in patient with history of prior MI-R/LHC as above. -On IV heparin, Lipitor 80 mg daily, Zetia and low-dose aspirin per cardiology -In OR for CABG  Acute on chronic combined CHF: TTE as above. BNP 860 (no prior to compare to).  No significant pulmonary edema on CXR.  Has some RLE swelling although in the setting of cellulitis and chronic venous insufficiency.  About 3.5 L UOP/24 hours.  Net -5.5 L charted.  Renal function is stable. -Cardiology managing-on IV Lasix 40 mg twice daily. -GDMT-on Coreg, Avapro, Aldactone -Closely monitor fluid status, renal functions and electrolytes  Sepsis due to RLE Cellulitis: POA.  Failed outpatient p.o. antibiotics.  CT confirms cellulitis but no emphysema or abscess.  Cellulitis improved on antibiotics.  Of note, patient has history of chronic venous insufficiency in RLE.  -RLE [place] negative for DVT on 2/1 per care everywhere. -IV vancomycin 2/3>> p.o. Zyvox 2/7>> 2/13 -Cefepime 2/3-2/5>> Unasyn>> 2/9 -Diabetic control -CT right tibia/fibula without contrast  Essential hypertension: Normotensive. -Cardiac meds as above.  Controlled IDDM-1 with hyperglycemia HLD and macrovascular complication: W1X 9.1%.  Recent Labs  Lab 11/20/20 0645 11/20/20 1051 11/20/20 1641 11/20/20 2005 11/21/20 0540  GLUCAP 201* 165* 285* 347* 197*  -Continue Lantus 15 units daily -Continue SSI-moderate -Continue NovoLog AC 4  units -High intensity statin as above -Further adjustment as appropriate  Elevated liver enzymes: Could be due to non-STEMI/sepsis.  Improving. -Continue monitoring  Hyponatremia: Resolved. -Continue monitoring  Dementia without behavioral disturbance: Fairly oriented. -Continue Buspar, Prozac, Restoril, Melatonin, trazodone -Continue  Aricept  Anxiety/insomnia: Seems stable now. -Continue home BuSpar, Prozac, trazodone, Restoril and melatonin -Continue as needed Ativan  Alcohol use: Reports drinking 2 beers a day. -Discontinue CIWA monitoring  OSA on CPAP -Continue nightly BiPAP.  Leukocytosis/bandemia: Likely due to cellulitis.  Improving. -Continue monitoring  Morbid obesity -Weight loss should be encouraged -Outpatient PCP/bariatric medicine f/u encouraged Body mass index is 33.12 kg/m. Nutrition Problem: Increased nutrient needs Etiology: acute illness Signs/Symptoms: estimated needs Interventions: MVI,Juven,Premier Protein   DVT prophylaxis:  Subcu Lovenox  Code Status: DNR/DNI Family Communication: Updated patient's wife over the phone on 2/9 Level of care: Progressive Cardiac.  He will likely be transferred to ICU after CABG. Status is: Inpatient  Remains inpatient appropriate because:Ongoing diagnostic testing needed not appropriate for outpatient work up, Unsafe d/c plan, IV treatments appropriate due to intensity of illness or inability to take PO and Inpatient level of care appropriate due to severity of illness   Dispo: The patient is from: Home              Anticipated d/c is to: Home              Anticipated d/c date is: > 3 days              Patient currently is not medically stable to d/c.   Difficult to place patient No       Consultants:  Cardiology Cardiothoracic surgery   Sch Meds:  Scheduled Meds: . [MAR Hold] aspirin  81 mg Oral Daily  . [MAR Hold] atorvastatin  80 mg Oral Daily  . [MAR Hold] busPIRone  15 mg Oral QPM  . [MAR Hold] busPIRone  30 mg Oral Q0600  . [MAR Hold] carvedilol  12.5 mg Oral BID WC  . [MAR Hold] docusate sodium  100 mg Oral BID  . [MAR Hold] donepezil  5 mg Oral QHS  . epinephrine  0-10 mcg/min Intravenous To OR  . [MAR Hold] ezetimibe  10 mg Oral Daily  . [MAR Hold] FLUoxetine  20 mg Oral Daily  . [MAR Hold] folic acid  1 mg Oral Daily   . [MAR Hold] furosemide  40 mg Intravenous BID  . [MAR Hold] guaiFENesin  600 mg Oral BID  . heparin-papaverine-plasmalyte irrigation   Irrigation To OR  . [MAR Hold] hydrocerin   Topical Daily  . [MAR Hold] insulin aspart  0-15 Units Subcutaneous TID WC  . [MAR Hold] insulin aspart  0-5 Units Subcutaneous QHS  . [MAR Hold] insulin aspart  4 Units Subcutaneous TID WC  . [MAR Hold] insulin glargine  15 Units Subcutaneous Daily  . [MAR Hold] irbesartan  300 mg Oral Daily  . [MAR Hold] linezolid  600 mg Oral Q12H  . [MAR Hold] loratadine  10 mg Oral Daily  . magnesium sulfate  40 mEq Other To OR  . [MAR Hold] mouth rinse  15 mL Mouth Rinse BID  . [MAR Hold] melatonin  3 mg Oral QHS  . [MAR Hold] metoCLOPramide (REGLAN) injection  5 mg Intravenous Q8H  . [MAR Hold] multivitamin with minerals  1 tablet Oral Daily  . [MAR Hold] nutrition supplement (JUVEN)  1 packet Oral BID BM  . [MAR Hold] pantoprazole  40 mg Oral Daily  .  potassium chloride  80 mEq Other To OR  . [MAR Hold] potassium chloride  40 mEq Oral Daily  . [MAR Hold] Ensure Max Protein  11 oz Oral QHS  . [MAR Hold] sodium chloride flush  3 mL Intravenous Q12H  . [MAR Hold] spironolactone  12.5 mg Oral Daily  . [MAR Hold] terazosin  5 mg Oral QHS  . [MAR Hold] thiamine  100 mg Oral Daily   Or  . [MAR Hold] thiamine  100 mg Intravenous Daily  . tranexamic acid  2 mg/kg Intracatheter To OR   Continuous Infusions: . sodium chloride 10 mL/hr at 11/14/20 0151  . [MAR Hold] sodium chloride    . dexmedetomidine    . heparin 30,000 units/NS 1000 mL solution for CELLSAVER    . heparin 1,850 Units/hr (11/21/20 0400)  . lactated ringers 10 mL/hr at 11/21/20 0803  . milrinone    . [MAR Hold] nitroGLYCERIN 5 mcg/min (11/19/20 1636)  . norepinephrine     PRN Meds:.[MAR Hold] sodium chloride, 0.9 % irrigation (POUR BTL), [MAR Hold] acetaminophen **OR** [MAR Hold] acetaminophen, [MAR Hold] acetaminophen, [MAR Hold] alum & mag  hydroxide-simeth, [MAR Hold] bisacodyl, bupivacaine, [MAR Hold] diphenhydrAMINE, hemostatic agents, heparin-papaverine-plasmalyte irrigation, [MAR Hold] hydrALAZINE, [MAR Hold] HYDROcodone-acetaminophen, [MAR Hold] ipratropium-albuterol, [MAR Hold]  morphine injection, [MAR Hold] nitroGLYCERIN, [MAR Hold] ondansetron **OR** [MAR Hold] ondansetron (ZOFRAN) IV, [MAR Hold] polyethylene glycol, [MAR Hold] sodium chloride flush, sterile water (preservative free), [MAR Hold] temazepam, vancomycin  Antimicrobials: Anti-infectives (From admission, onward)   Start     Dose/Rate Route Frequency Ordered Stop   11/21/20 1042  vancomycin (VANCOCIN) powder          As needed 11/21/20 1042     11/21/20 0400  vancomycin (VANCOREADY) IVPB 1500 mg/300 mL        1,500 mg 150 mL/hr over 120 Minutes Intravenous To Surgery 11/20/20 0924 11/21/20 0945   11/21/20 0400  cefUROXime (ZINACEF) 1.5 g in sodium chloride 0.9 % 100 mL IVPB        1.5 g 200 mL/hr over 30 Minutes Intravenous To Surgery 11/20/20 0924 11/21/20 0945   11/21/20 0400  cefUROXime (ZINACEF) 750 mg in sodium chloride 0.9 % 100 mL IVPB        750 mg 200 mL/hr over 30 Minutes Intravenous To Surgery 11/20/20 0924 11/21/20 1415   11/20/20 1000  [MAR Hold]  linezolid (ZYVOX) tablet 600 mg        (MAR Hold since Fri 11/21/2020 at 0744.Hold Reason: Transfer to a Procedural area.)   600 mg Oral Every 12 hours 11/20/20 0811 11/23/20 0959   11/17/20 1115  linezolid (ZYVOX) tablet 600 mg        600 mg Oral Every 12 hours 11/17/20 1023 11/19/20 2206   11/15/20 0800  Ampicillin-Sulbactam (UNASYN) 3 g in sodium chloride 0.9 % 100 mL IVPB        3 g 200 mL/hr over 30 Minutes Intravenous Every 6 hours 11/15/20 0726 11/19/20 2359   11/14/20 1630  vancomycin (VANCOREADY) IVPB 1500 mg/300 mL  Status:  Discontinued        1,500 mg 150 mL/hr over 120 Minutes Intravenous Every 24 hours 11/13/20 1542 11/17/20 1023   11/13/20 2300  cefTRIAXone (ROCEPHIN) 1 g in sodium  chloride 0.9 % 100 mL IVPB  Status:  Discontinued        1 g 200 mL/hr over 30 Minutes Intravenous Every 24 hours 11/13/20 1158 11/13/20 1510   11/13/20 1600  vancomycin (VANCOCIN) 2,250  mg in sodium chloride 0.9 % 500 mL IVPB        2,250 mg 250 mL/hr over 120 Minutes Intravenous  Once 11/13/20 1510 11/13/20 1855   11/13/20 1600  ceFEPIme (MAXIPIME) 2 g in sodium chloride 0.9 % 100 mL IVPB  Status:  Discontinued        2 g 200 mL/hr over 30 Minutes Intravenous  Once 11/13/20 1510 11/13/20 1542   11/13/20 1600  ceFEPIme (MAXIPIME) 2 g in sodium chloride 0.9 % 100 mL IVPB  Status:  Discontinued        2 g 200 mL/hr over 30 Minutes Intravenous Every 8 hours 11/13/20 1542 11/15/20 0726   11/13/20 0000  cefTRIAXone (ROCEPHIN) 2 g in sodium chloride 0.9 % 100 mL IVPB        2 g 200 mL/hr over 30 Minutes Intravenous  Once 11/12/20 2352 11/13/20 0042       I have personally reviewed the following labs and images: CBC: Recent Labs  Lab 11/17/20 0249 11/18/20 0138 11/18/20 1238 11/19/20 0556 11/20/20 0031 11/21/20 0017 11/21/20 0956 11/21/20 1232 11/21/20 1244 11/21/20 1325 11/21/20 1334 11/21/20 1410  WBC 21.9* 16.9*  --  14.2* 10.8* 10.3  --   --   --   --   --   --   HGB 10.8* 11.2*   < > 10.5* 11.1* 11.2*   < > 8.8* 8.5* 8.5* 8.4* 8.5*  HCT 30.7* 33.8*   < > 30.1* 32.2* 32.2*   < > 26.0* 25.0* 25.0* 25.2* 25.0*  MCV 93.3 95.5  --  94.4 94.4 95.3  --   --   --   --   --   --   PLT 379 409*  --  400 410* 388  --   --   --   --  310  --    < > = values in this interval not displayed.   BMP &GFR Recent Labs  Lab 11/15/20 0253 11/16/20 1610 11/17/20 0249 11/18/20 0138 11/18/20 1238 11/19/20 0556 11/20/20 0031 11/21/20 0017 11/21/20 0956 11/21/20 1213 11/21/20 1225 11/21/20 1232 11/21/20 1244 11/21/20 1325 11/21/20 1410  NA 130* 135 134* 137   < > 136 137 135   < > 136 138 138 139 137 139  K 5.0 5.1 4.6 4.0   < > 4.3 4.3 4.0   < > 3.8 3.9 3.5 4.3 4.9 4.3  CL 95* 99  98 101  --  99 98 95*   < > 98  --  97* 98 100 100  CO2 21* 21* 26 24  --  26 29 32  --   --   --   --   --   --   --   GLUCOSE 324* 264* 188* 57*  --  182* 188* 229*   < > 117*  --  107* 102* 102* 111*  BUN 28* 44* 57* 46*  --  41* 38* 31*   < > 25*  --  23 24* 23 24*  CREATININE 1.22 1.15 1.15 0.94  --  1.17 1.09 1.01   < > 0.80  --  0.80 0.70 0.80 0.80  CALCIUM 8.7* 8.9 8.8* 9.1  --  8.7* 8.9 8.7*  --   --   --   --   --   --   --   MG 2.2 2.3 2.4 2.2  --   --  1.8 1.8  --   --   --   --   --   --   --  PHOS 3.6 4.1 4.0 4.1  --   --   --   --   --   --   --   --   --   --   --    < > = values in this interval not displayed.   Estimated Creatinine Clearance: 96.6 mL/min (by C-G formula based on SCr of 0.8 mg/dL). Liver & Pancreas: Recent Labs  Lab 11/15/20 0253 11/16/20 0608 11/17/20 0249 11/18/20 0138  AST 54* 32  --   --   ALT 65* 69*  --   --   ALKPHOS 244* 183*  --   --   BILITOT 1.0 1.0  --   --   PROT 5.9* 5.8*  --   --   ALBUMIN 2.2* 2.2* 2.0* 2.2*   No results for input(s): LIPASE, AMYLASE in the last 168 hours. No results for input(s): AMMONIA in the last 168 hours. Diabetic: Recent Labs    11/21/20 0017  HGBA1C 7.1*   Recent Labs  Lab 11/20/20 0645 11/20/20 1051 11/20/20 1641 11/20/20 2005 11/21/20 0540  GLUCAP 201* 165* 285* 347* 197*   Cardiac Enzymes: No results for input(s): CKTOTAL, CKMB, CKMBINDEX, TROPONINI in the last 168 hours. No results for input(s): PROBNP in the last 8760 hours. Coagulation Profile: Recent Labs  Lab 11/21/20 0017  INR 1.2   Thyroid Function Tests: No results for input(s): TSH, T4TOTAL, FREET4, T3FREE, THYROIDAB in the last 72 hours. Lipid Profile: No results for input(s): CHOL, HDL, LDLCALC, TRIG, CHOLHDL, LDLDIRECT in the last 72 hours. Anemia Panel: No results for input(s): VITAMINB12, FOLATE, FERRITIN, TIBC, IRON, RETICCTPCT in the last 72 hours. Urine analysis:    Component Value Date/Time   COLORURINE YELLOW  11/20/2020 1957   APPEARANCEUR CLEAR 11/20/2020 1957   LABSPEC 1.010 11/20/2020 1957   PHURINE 6.0 11/20/2020 1957   GLUCOSEU >=500 (A) 11/20/2020 1957   HGBUR LARGE (A) 11/20/2020 1957   BILIRUBINUR NEGATIVE 11/20/2020 [place] NEGATIVE 11/20/2020 1957   PROTEINUR NEGATIVE 11/20/2020 1957   NITRITE NEGATIVE 11/20/2020 1957   LEUKOCYTESUR NEGATIVE 11/20/2020 1957   Sepsis Labs: Invalid input(s): PROCALCITONIN, [place]  Microbiology: Recent Results (from the past 240 hour(s))  Culture, blood (routine x 2)     Status: None   Collection Time: 11/12/20  9:36 PM   Specimen: BLOOD  Result Value Ref Range Status   Specimen Description   Final    BLOOD Blood Culture adequate volume Performed at [facility], [address] [place], [place] [address]    Special Requests   Final    BOTTLES DRAWN AEROBIC AND ANAEROBIC RIGHT ANTECUBITAL Performed at [facility], [address] [place], [place] [address]    Culture   Final    NO GROWTH 5 DAYS Performed at [facility], [place] [address] [place], [place] [address]    Report Status 11/18/2020 FINAL  Final  SARS CORONAVIRUS 2 (TAT 6-24 HRS) Nasopharyngeal     Status: None   Collection Time: 11/12/20  9:44 PM   Specimen: Nasopharyngeal  Result Value Ref Range Status   SARS Coronavirus 2 NEGATIVE NEGATIVE Final    Comment: (NOTE) SARS-CoV-2 target nucleic acids are NOT DETECTED.  The SARS-CoV-2 RNA is generally detectable in upper and lower respiratory specimens during the acute phase of infection. Negative results do not preclude SARS-CoV-2 infection, do not rule out co-infections with other pathogens, and should not be used as the sole basis  for treatment or other patient management decisions. Negative results must be combined with clinical observations, patient history, and epidemiological information. The expected result is Negative.  Fact Sheet for  Patients: SugarRoll.be  Fact Sheet for Healthcare Providers: https://www.woods-mathews.com/  This test is not yet approved or cleared by the [place] FDA and  has been authorized for detection and/or diagnosis of SARS-CoV-2 by FDA under an Emergency Use Authorization (EUA). This EUA will remain  in effect (meaning this test can be used) for the duration of the COVID-19 declaration under Se ction 564(b)(1) of the Act, 21 U.S.C. section 360bbb-3(b)(1), unless the authorization is terminated or revoked sooner.  Performed at [facility], [place] [address] [place], [place] [address]   Culture, blood (routine x 2)     Status: None   Collection Time: 11/12/20 10:11 PM   Specimen: BLOOD  Result Value Ref Range Status   Specimen Description   Final    BLOOD BLOOD RIGHT HAND Performed at [facility], [address] [place], [place] [address]    Special Requests   Final    BOTTLES DRAWN AEROBIC AND ANAEROBIC Blood Culture adequate volume Performed at [facility], [address] [place], [place] [address]    Culture   Final    NO GROWTH 5 DAYS Performed at [facility], [place] [address], [place], [place] [address]    Report Status 11/18/2020 FINAL  Final  MRSA PCR Screening     Status: None   Collection Time: 11/16/20  3:03 PM   Specimen: Nasal Mucosa; Nasopharyngeal  Result Value Ref Range Status   MRSA by PCR NEGATIVE NEGATIVE Final    Comment:        The GeneXpert MRSA Assay (FDA approved for NASAL specimens only), is one component of a comprehensive MRSA colonization surveillance program. It is not intended to diagnose MRSA infection nor to guide or monitor treatment for MRSA infections. Performed at [facility], [place] [address], [place], [place] [address]   Surgical pcr screen     Status: None   Collection Time: 11/17/20 10:41 PM   Specimen: Nasal Mucosa; Nasal Swab  Result Value Ref  Range Status   MRSA, PCR NEGATIVE NEGATIVE Final   Staphylococcus aureus NEGATIVE NEGATIVE Final    Comment: (NOTE) The Xpert SA Assay (FDA approved for NASAL specimens in patients 7 years of age and older), is one component of a comprehensive surveillance program. It is not intended to diagnose infection nor to guide or monitor treatment. Performed at [facility], [place] [address] [place], [place] [address]     Radiology Studies: DG Chest 2 View  Result Date: 11/20/2020 CLINICAL DATA:  Preoperative cardiovascular exam smoker EXAM: CHEST - 2 VIEW COMPARISON:  CT 11/19/2020, radiograph 11/15/2020 FINDINGS: Persistent bilateral effusions with diffuse hazy interstitial opacity most confluent towards the lung bases on a background of pulmonary vascular congestion and stable cardiomegaly. Pulmonary nodularity seen on comparison CT is less well visualized on this exam. Additional emphysematous features are better detailed on prior CT. The aorta is calcified. The remaining cardiomediastinal contours are unremarkable. No acute osseous or soft tissue abnormality. Degenerative changes are present in the imaged spine and shoulders. Telemetry leads overlie the chest. IMPRESSION: Findings most compatible with CHF/volume overload, similar to comparison exam with features of cardiomegaly, vascular congestion, pulmonary edema and small bibasilar effusions. Electronically Signed   By: Lovena Le M.D.   On:  11/20/2020 20:32   CT TIBIA FIBULA RIGHT WO CONTRAST  Result Date: 11/21/2020 CLINICAL DATA:  Lower leg soft tissue infection. EXAM: CT OF THE LOWER RIGHT EXTREMITY WITHOUT CONTRAST TECHNIQUE: Multidetector CT imaging of the right lower extremity was performed according to the standard protocol. COMPARISON:  None. FINDINGS: Bones/Joint/Cartilage No fracture or dislocation. Normal alignment. No joint effusion. Round 9 mm sclerotic lesion in the distal femoral metaphysis, nonspecific. Ligaments Ligaments  are suboptimally evaluated by CT. Muscles and Tendons Grossly intact.  No muscle atrophy. Soft tissue Severe circumferential soft tissue swelling and skin thickening of the lower leg extending into the foot. No fluid collection or subcutaneous emphysema. No soft tissue mass. IMPRESSION: 1. Severe circumferential soft tissue swelling and skin thickening of the lower leg extending into the foot, consistent with reported history of cellulitis. No abscess or subcutaneous emphysema. 2. Round 9 mm sclerotic lesion in the distal femoral metaphysis, nonspecific. In the absence of known malignancy, this is likely a benign bone island. Electronically Signed   By: Titus Dubin M.D.   On: 11/21/2020 08:21   ECHO INTRAOPERATIVE TEE  Result Date: 11/21/2020  *INTRAOPERATIVE TRANSESOPHAGEAL REPORT *  Patient Name:   MARKIE FRITH Date of Exam: 11/21/2020 Medical Rec #:  [id number]       Height:       68.0 in Accession #:    [id number]      Weight:       217.8 lb Date of Birth:  1951/07/23       BSA:          2.12 m Patient Age:    47 years        BP:           107/59 mmHg Patient Gender: M               HR:           65 bpm. Exam Location:  Anesthesiology Transesophogeal exam was perform intraoperatively during surgical procedure. Patient was closely monitored under general anesthesia during the entirety of examination. Indications:     CAD Sonographer:     Dustin Flock RDCS Performing Phys: [id number] VFIEPPI Z ATKINS Diagnosing Phys: Renold Don MD Complications: No known complications during this procedure. POST-OP IMPRESSIONS - Left Ventricle: The left ventricle is unchanged from pre-bypass. - Right Ventricle: The right ventricle appears unchanged from pre-bypass. - Aorta: The aorta appears unchanged from pre-bypass. - Left Atrium: The left atrium appears unchanged from pre-bypass. - Left Atrial Appendage: The left atrial appendage appears unchanged from pre-bypass. - Aortic Valve: The aortic valve appears unchanged  from pre-bypass. - Mitral Valve: The mitral valve appears unchanged from pre-bypass. - Tricuspid Valve: The tricuspid valve appears unchanged from pre-bypass. - Interatrial Septum: The interatrial septum appears unchanged from pre-bypass. - Interventricular Septum: The interventricular septum appears unchanged from pre-bypass. - Pericardium: The pericardium appears unchanged from pre-bypass. PRE-OP FINDINGS  Left Ventricle: The left ventricle has mild-moderately reduced systolic function, with an ejection fraction of 40-45%. The cavity size was normal. There is no increase in left ventricular wall thickness. Right Ventricle: The right ventricle has normal systolic function. The cavity was normal. There is no increase in right ventricular wall thickness. Left Atrium: Left atrial size was normal in size. There is continuous echo contrast seen in the left atrial cavity. The left atrial appendage is well visualized and there is no evidence of thrombus present. Right Atrium: Right atrial size was normal in size. Interatrial Septum: No  atrial level shunt detected by color flow Doppler. Pericardium: There is no evidence of pericardial effusion. Mitral Valve: The mitral valve is normal in structure. Mitral valve regurgitation is mild by color flow Doppler. The MR jet is posteriorly-directed. There is No evidence of mitral stenosis. Tricuspid Valve: The tricuspid valve was normal in structure. Tricuspid valve regurgitation was not visualized by color flow Doppler. Aortic Valve: The aortic valve is tricuspid Aortic valve regurgitation was not visualized by color flow Doppler. There is no evidence of aortic valve stenosis. Pulmonic Valve: The pulmonic valve was not well visualized. Pulmonic valve regurgitation is not visualized by color flow Doppler. Aorta: The aortic root, ascending aorta and aortic arch are normal in size and structure.  Renold Don MD Electronically signed by Renold Don MD Signature Date/Time:  11/21/2020/2:30:23 PM    Final     Florian Chauca T. [place]  If 7PM-7AM, please contact night-coverage [email or web address] 11/21/2020, 3:06 PM

## 2020-11-22 ENCOUNTER — Inpatient Hospital Stay (HOSPITAL_COMMUNITY): Payer: HMO

## 2020-11-22 DIAGNOSIS — I340 Nonrheumatic mitral (valve) insufficiency: Secondary | ICD-10-CM

## 2020-11-22 DIAGNOSIS — F0390 Unspecified dementia without behavioral disturbance: Secondary | ICD-10-CM

## 2020-11-22 DIAGNOSIS — E1169 Type 2 diabetes mellitus with other specified complication: Secondary | ICD-10-CM | POA: Diagnosis not present

## 2020-11-22 DIAGNOSIS — I152 Hypertension secondary to endocrine disorders: Secondary | ICD-10-CM

## 2020-11-22 DIAGNOSIS — E785 Hyperlipidemia, unspecified: Secondary | ICD-10-CM

## 2020-11-22 DIAGNOSIS — I5020 Unspecified systolic (congestive) heart failure: Secondary | ICD-10-CM | POA: Diagnosis not present

## 2020-11-22 DIAGNOSIS — I214 Non-ST elevation (NSTEMI) myocardial infarction: Secondary | ICD-10-CM | POA: Diagnosis not present

## 2020-11-22 DIAGNOSIS — E1159 Type 2 diabetes mellitus with other circulatory complications: Secondary | ICD-10-CM

## 2020-11-22 LAB — MAGNESIUM
Magnesium: 2.5 mg/dL — ABNORMAL HIGH (ref 1.7–2.4)
Magnesium: 2.6 mg/dL — ABNORMAL HIGH (ref 1.7–2.4)

## 2020-11-22 LAB — CBC
HCT: 28.1 % — ABNORMAL LOW (ref 39.0–52.0)
HCT: 28 % — ABNORMAL LOW (ref 39.0–52.0)
Hemoglobin: 9 g/dL — ABNORMAL LOW (ref 13.0–17.0)
Hemoglobin: 9.1 g/dL — ABNORMAL LOW (ref 13.0–17.0)
MCH: 32 pg (ref 26.0–34.0)
MCH: 32 pg (ref 26.0–34.0)
MCHC: 32 g/dL (ref 30.0–36.0)
MCHC: 32.5 g/dL (ref 30.0–36.0)
MCV: 100 fL (ref 80.0–100.0)
MCV: 98.6 fL (ref 80.0–100.0)
Platelets: 268 10*3/uL (ref 150–400)
Platelets: 267 10*3/uL (ref 150–400)
RBC: 2.81 MIL/uL — ABNORMAL LOW (ref 4.22–5.81)
RBC: 2.84 MIL/uL — ABNORMAL LOW (ref 4.22–5.81)
RDW: 13.5 % (ref 11.5–15.5)
RDW: 13.5 % (ref 11.5–15.5)
WBC: 12.9 10*3/uL — ABNORMAL HIGH (ref 4.0–10.5)
WBC: 13.9 10*3/uL — ABNORMAL HIGH (ref 4.0–10.5)
nRBC: 0 % (ref 0.0–0.2)
nRBC: 0 % (ref 0.0–0.2)

## 2020-11-22 LAB — BASIC METABOLIC PANEL
Anion gap: 9 (ref 5–15)
Anion gap: 9 (ref 5–15)
BUN: 26 mg/dL — ABNORMAL HIGH (ref 8–23)
BUN: 28 mg/dL — ABNORMAL HIGH (ref 8–23)
CO2: 24 mmol/L (ref 22–32)
CO2: 25 mmol/L (ref 22–32)
Calcium: 7.8 mg/dL — ABNORMAL LOW (ref 8.9–10.3)
Calcium: 7.9 mg/dL — ABNORMAL LOW (ref 8.9–10.3)
Chloride: 102 mmol/L (ref 98–111)
Chloride: 99 mmol/L (ref 98–111)
Creatinine, Ser: 1.02 mg/dL (ref 0.61–1.24)
Creatinine, Ser: 1.05 mg/dL (ref 0.61–1.24)
GFR, Estimated: >60 mL/min (ref >60)
GFR, Estimated: >60 mL/min (ref >60)
Glucose, Bld: 143 mg/dL — ABNORMAL HIGH (ref 70–99)
Glucose, Bld: 314 mg/dL — ABNORMAL HIGH (ref 70–99)
Potassium: 4.3 mmol/L (ref 3.5–5.1)
Potassium: 4.3 mmol/L (ref 3.5–5.1)
Sodium: 135 mmol/L (ref 135–145)
Sodium: 133 mmol/L — ABNORMAL LOW (ref 135–145)

## 2020-11-22 LAB — GLUCOSE, CAPILLARY
Glucose-Capillary: 100 mg/dL — ABNORMAL HIGH (ref 70–99)
Glucose-Capillary: 126 mg/dL — ABNORMAL HIGH (ref 70–99)
Glucose-Capillary: 116 mg/dL — ABNORMAL HIGH (ref 70–99)
Glucose-Capillary: 117 mg/dL — ABNORMAL HIGH (ref 70–99)
Glucose-Capillary: 115 mg/dL — ABNORMAL HIGH (ref 70–99)
Glucose-Capillary: 120 mg/dL — ABNORMAL HIGH (ref 70–99)
Glucose-Capillary: 132 mg/dL — ABNORMAL HIGH (ref 70–99)
Glucose-Capillary: 238 mg/dL — ABNORMAL HIGH (ref 70–99)
Glucose-Capillary: 209 mg/dL — ABNORMAL HIGH (ref 70–99)
Glucose-Capillary: 298 mg/dL — ABNORMAL HIGH (ref 70–99)
Glucose-Capillary: 324 mg/dL — ABNORMAL HIGH (ref 70–99)

## 2020-11-22 MED ORDER — INSULIN GLARGINE 100 UNIT/ML ~~LOC~~ SOLN
28.0000 [IU] | Freq: Every day | SUBCUTANEOUS | Status: DC
  Administered 2020-11-22: 28 [IU] via SUBCUTANEOUS
  Filled 2020-11-22: qty 0.28

## 2020-11-22 MED ORDER — INSULIN GLARGINE 100 UNIT/ML ~~LOC~~ SOLN
18.0000 [IU] | Freq: Every day | SUBCUTANEOUS | Status: DC
  Administered 2020-11-23: 18 [IU] via SUBCUTANEOUS
  Filled 2020-11-22: qty 0.18

## 2020-11-22 MED ORDER — ENOXAPARIN SODIUM 40 MG/0.4ML ~~LOC~~ SOLN
40.0000 mg | Freq: Every day | SUBCUTANEOUS | Status: DC
  Administered 2020-11-23 – 2020-11-26 (×4): 40 mg via SUBCUTANEOUS
  Filled 2020-11-22 (×4): qty 0.4

## 2020-11-22 MED ORDER — INSULIN ASPART 100 UNIT/ML ~~LOC~~ SOLN
0.0000 [IU] | SUBCUTANEOUS | Status: DC
  Administered 2020-11-22: 16 [IU] via SUBCUTANEOUS
  Administered 2020-11-22: 12 [IU] via SUBCUTANEOUS
  Administered 2020-11-22 – 2020-11-23 (×2): 8 [IU] via SUBCUTANEOUS
  Administered 2020-11-23 (×2): 2 [IU] via SUBCUTANEOUS
  Administered 2020-11-23: 4 [IU] via SUBCUTANEOUS
  Administered 2020-11-23 (×2): 2 [IU] via SUBCUTANEOUS
  Administered 2020-11-24: 12 [IU] via SUBCUTANEOUS
  Administered 2020-11-24: 2 [IU] via SUBCUTANEOUS
  Administered 2020-11-24: 8 [IU] via SUBCUTANEOUS
  Administered 2020-11-24: 4 [IU] via SUBCUTANEOUS

## 2020-11-22 MED ORDER — FUROSEMIDE 10 MG/ML IJ SOLN
40.0000 mg | Freq: Two times a day (BID) | INTRAMUSCULAR | Status: DC
  Administered 2020-11-22 – 2020-11-23 (×2): 40 mg via INTRAVENOUS
  Filled 2020-11-22 (×2): qty 4

## 2020-11-22 NOTE — Progress Notes (Signed)
Inpatient Diabetes Program Recommendations  AACE/ADA: New Consensus Statement on Inpatient Glycemic Control (2015)  Target Ranges:  Prepandial:   less than 140 mg/dL      Peak postprandial:   less than 180 mg/dL (1-2 hours)      Critically ill patients:  140 - 180 mg/dL   Lab Results  Component Value Date   GLUCAP 298 (H) 11/22/2020   HGBA1C 7.1 (H) 11/21/2020    Review of Glycemic Control Results for Jeremy Hickman, Jeremy Hickman (MRN [id number]) as of 11/22/2020 18:52  Ref. Range 11/22/2020 08:56 11/22/2020 11:45 11/22/2020 15:57  Glucose-Capillary Latest Ref Range: 70 - 99 mg/dL 238 (H) 209 (H) 298 (H)   Diabetes history: Type 1 DM Outpatient Diabetes medications: Lantus 42 units q HS, Novolog 15 units tid with meals (patient manages his own doses to keep  Current orders for Inpatient glycemic control:  Novolog 0-24 units q 4 hours  Inpatient Diabetes Program Recommendations:    Paged received given patient did not receive basal insulin following transition off insulin drip. Labs reviewed and updated. Noted correction amount given. Of note, patient did receive first correction dose two hours late.   At this time, consider Lantus 28 units x 1 dose. Continue with Q4H until patient establishes consistent oral intake.  Will follow up in early AM as anticipate patient will require less basal.   Thanks, Bronson Curb, MSN, RNC-OB Diabetes Coordinator [phone] (8a-5p)

## 2020-11-22 NOTE — Progress Notes (Signed)
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  CABG 11/22/20 with LIMA to LAD, SVG to D1, SVT to PDA sequential PLB, RIMA to OM on 11/22/20.  Patient notes that he is doing OK.  Notes no pain but just feels bit disoriented- realizes it is 8 AM.  No chest pain or pressure .  No SOB/DOE.  No palpitations.  Hoping to go back to sleep (notes the ICU is not a place for rest).   Inpatient Medications    Scheduled Meds: . acetaminophen  1,000 mg Oral Q6H   Or  . acetaminophen (TYLENOL) oral liquid 160 mg/5 mL  1,000 mg Per Tube Q6H  . acetaminophen (TYLENOL) oral liquid 160 mg/5 mL  650 mg Per Tube Once   Or  . acetaminophen  650 mg Rectal Once  . aspirin EC  325 mg Oral Daily   Or  . aspirin  324 mg Per Tube Daily  . atorvastatin  80 mg Oral Daily  . bisacodyl  10 mg Oral Daily   Or  . bisacodyl  10 mg Rectal Daily  . busPIRone  15 mg Oral QPM  . busPIRone  30 mg Oral Q0600  . chlorhexidine  15 mL Mouth/Throat NOW  . Chlorhexidine Gluconate Cloth  6 each Topical Daily  . docusate sodium  200 mg Oral Daily  . donepezil  5 mg Oral QHS  . ezetimibe  10 mg Oral Daily  . FLUoxetine  20 mg Oral Daily  . folic acid  1 mg Oral Daily  . hydrocerin   Topical Daily  . linezolid  600 mg Oral Q12H  . loratadine  10 mg Oral Daily  . metoprolol tartrate  12.5 mg Oral BID   Or  . metoprolol tartrate  12.5 mg Per Tube BID  . nutrition supplement (JUVEN)  1 packet Oral BID BM  . pantoprazole  40 mg Oral Daily  . Ensure Max Protein  11 oz Oral QHS  . sodium chloride flush  3 mL Intravenous Q12H  . terazosin  5 mg Oral QHS  . thiamine  100 mg Oral Daily    Or  . thiamine  100 mg Intravenous Daily   Continuous Infusions: . sodium chloride Stopped (11/21/20 1839)  . sodium chloride    . sodium chloride 10 mL/hr at 11/21/20 1955  . albumin human 12.5 g (11/22/20 0209)  . cefUROXime (ZINACEF)  IV Stopped (11/21/20 2139)  . dexmedetomidine (PRECEDEX) IV infusion 0.7 mcg/kg/hr (11/21/20 1559)  . electrolyte-A 125 mL/hr at 11/22/20 0700  . famotidine (PEPCID) IV Stopped (11/21/20 2256)  . insulin 1.1 mL/hr at 11/22/20 0700  . lactated ringers    . lactated ringers    . lactated ringers 20 mL/hr at 11/22/20 0700  . nitroGLYCERIN Stopped (11/21/20 1608)  . phenylephrine (NEO-SYNEPHRINE) Adult infusion 5 mcg/min (11/22/20 0700)   PRN Meds: sodium chloride, albumin human, dextrose, diphenhydrAMINE, lactated ringers, metoprolol tartrate, midazolam, morphine injection, ondansetron (ZOFRAN) IV, oxyCODONE, polyethylene glycol, sodium chloride flush, temazepam, traMADol   Vital Signs    Vitals:   11/22/20 0400 11/22/20 0500 11/22/20 0600 11/22/20 0700  BP: 113/75     Pulse: 90  68 88 90  Resp: 12 (!) 21 10 13   Temp: 98.06 F (36.7 C) 98.1 F (36.7 C)  97.88 F (36.6 C)  TempSrc: Core     SpO2: 99% 95% 98% 97%  Weight:  108.5 kg    Height:        Intake/Output Summary (Last 24 hours) at 11/22/2020 3428 Last data filed at 11/22/2020 0700 Gross per 24 hour  Intake 6742.66 ml  Output 2366 ml  Net 4376.66 ml   Filed Weights   11/21/20 0543 11/21/20 0809 11/22/20 0500  Weight: 98.8 kg 98.8 kg 108.5 kg    Telemetry    Paced Rhythm with PVCs in couples and triplets - Personally Reviewed  ECG    Sinus Bradycardia with sinus arrhythmia rate 59 with RBBB - Personally Reviewed 11/22/20 (RBBB old from 11/15/20)  Physical Exam   GEN: Somnolent but easily rousable Neck: R IJ Swan/d/i; Wedge ~ 25 Cardiac: RRR, no murmurs, rubs, or gallops.  Respiratory: Decreased breath sounds in the bases, no crackles on exam GI: Soft, nontender,  non-distended  Neuro:  Nonfocal  Psych: Normal affect   Labs    Chemistry Recent Labs  Lab 11/16/20 0608 11/17/20 0249 11/18/20 0138 11/18/20 1238 11/21/20 0017 11/21/20 0956 11/21/20 1410 11/21/20 1542 11/21/20 2003 11/21/20 2030 11/22/20 0544  NA 135 134* 137   < > 135   < > 139   < > 138 135 135  K 5.1 4.6 4.0   < > 4.0   < > 4.3   < > 4.6 4.6 4.3  CL 99 98 101   < > 95*   < > 100  --   --  103 102  CO2 21* 26 24   < > 32  --   --   --   --  24 24  GLUCOSE 264* 188* 57*   < > 229*   < > 111*  --   --  118* 143*  BUN 44* 57* 46*   < > 31*   < > 24*  --   --  24* 26*  CREATININE 1.15 1.15 0.94   < > 1.01   < > 0.80  --   --  0.90 1.02  CALCIUM 8.9 8.8* 9.1   < > 8.7*  --   --   --   --  7.9* 7.8*  PROT 5.8*  --   --   --   --   --   --   --   --   --   --   ALBUMIN 2.2* 2.0* 2.2*  --   --   --   --   --   --   --   --   AST 32  --   --   --   --   --   --   --   --   --   --   ALT 69*  --   --   --   --   --   --   --   --   --   --   ALKPHOS 183*  --   --   --   --   --   --   --   --   --   --   BILITOT 1.0  --   --   --   --   --   --   --   --   --   --  GFRNONAA >60 >60 >60   < > >60  --   --   --   --  >60 >60  ANIONGAP 15 10 12    < > 8  --   --   --   --  8 9   < > = values in this interval not displayed.     Hematology Recent Labs  Lab 11/21/20 1551 11/21/20 1832 11/21/20 2003 11/21/20 2030 11/22/20 0544  WBC 14.4*  --   --  17.8* 12.9*  RBC 3.10*  --   --  3.07* 2.81*  HGB 9.9*   < > 9.5* 9.9* 9.0*  HCT 31.1*   < > 28.0* 30.5* 28.1*  MCV 100.3*  --   --  99.3 100.0  MCH 31.9  --   --  32.2 32.0  MCHC 31.8  --   --  32.5 32.0  RDW 13.4  --   --  13.4 13.5  PLT 292  --   --  317 268   < > = values in this interval not displayed.    Cardiac EnzymesNo results for input(s): TROPONINI in the last 168 hours. No results for input(s): TROPIPOC in the last 168 hours.   BNPNo results for input(s): BNP, PROBNP in the last 168 hours.   DDimer No results  for input(s): DDIMER in the last 168 hours.   Radiology    DG Chest 2 View  Result Date: 11/20/2020 CLINICAL DATA:  Preoperative cardiovascular exam smoker EXAM: CHEST - 2 VIEW COMPARISON:  CT 11/19/2020, radiograph 11/15/2020 FINDINGS: Persistent bilateral effusions with diffuse hazy interstitial opacity most confluent towards the lung bases on a background of pulmonary vascular congestion and stable cardiomegaly. Pulmonary nodularity seen on comparison CT is less well visualized on this exam. Additional emphysematous features are better detailed on prior CT. The aorta is calcified. The remaining cardiomediastinal contours are unremarkable. No acute osseous or soft tissue abnormality. Degenerative changes are present in the imaged spine and shoulders. Telemetry leads overlie the chest. IMPRESSION: Findings most compatible with CHF/volume overload, similar to comparison exam with features of cardiomegaly, vascular congestion, pulmonary edema and small bibasilar effusions. Electronically Signed   By: Lovena Le M.D.   On: 11/20/2020 20:32   CT TIBIA FIBULA RIGHT WO CONTRAST  Result Date: 11/21/2020 CLINICAL DATA:  Lower leg soft tissue infection. EXAM: CT OF THE LOWER RIGHT EXTREMITY WITHOUT CONTRAST TECHNIQUE: Multidetector CT imaging of the right lower extremity was performed according to the standard protocol. COMPARISON:  None. FINDINGS: Bones/Joint/Cartilage No fracture or dislocation. Normal alignment. No joint effusion. Round 9 mm sclerotic lesion in the distal femoral metaphysis, nonspecific. Ligaments Ligaments are suboptimally evaluated by CT. Muscles and Tendons Grossly intact.  No muscle atrophy. Soft tissue Severe circumferential soft tissue swelling and skin thickening of the lower leg extending into the foot. No fluid collection or subcutaneous emphysema. No soft tissue mass. IMPRESSION: 1. Severe circumferential soft tissue swelling and skin thickening of the lower leg extending into the  foot, consistent with reported history of cellulitis. No abscess or subcutaneous emphysema. 2. Round 9 mm sclerotic lesion in the distal femoral metaphysis, nonspecific. In the absence of known malignancy, this is likely a benign bone island. Electronically Signed   By: Titus Dubin M.D.   On: 11/21/2020 08:21   DG Chest Port 1 View  Result Date: 11/21/2020 CLINICAL DATA:  CABG EXAM: PORTABLE CHEST 1 VIEW COMPARISON:  11/20/2020 FINDINGS: Postop CABG. Endotracheal tube in good position. Swan-Ganz catheter  in the right pulmonary artery. Bilateral chest tubes in place. NG tube in the stomach Negative for pneumothorax. Mild atelectasis in the bases. No significant pleural effusion. Negative for edema. IMPRESSION: Postop CABG.  No complication.  Negative for pneumothorax. Electronically Signed   By: Franchot Gallo M.D.   On: 11/21/2020 15:54   ECHO INTRAOPERATIVE TEE  Result Date: 11/21/2020  *INTRAOPERATIVE TRANSESOPHAGEAL REPORT *  Patient Name:   Jeremy Hickman Date of Exam: 11/21/2020 Medical Rec #:  [id number]       Height:       68.0 in Accession #:    [id number]      Weight:       217.8 lb Date of Birth:  [birth year]       BSA:          2.12 m Patient Age:    38 years        BP:           107/59 mmHg Patient Gender: M               HR:           65 bpm. Exam Location:  Anesthesiology Transesophogeal exam was perform intraoperatively during surgical procedure. Patient was closely monitored under general anesthesia during the entirety of examination. Indications:     CAD Sonographer:     Dustin Flock RDCS Performing Phys: [id number] MPNTIRW Z ATKINS Diagnosing Phys: Renold Don MD Complications: No known complications during this procedure. POST-OP IMPRESSIONS - Left Ventricle: The left ventricle is unchanged from pre-bypass. - Right Ventricle: The right ventricle appears unchanged from pre-bypass. - Aorta: The aorta appears unchanged from pre-bypass. - Left Atrium: The left atrium appears unchanged  from pre-bypass. - Left Atrial Appendage: The left atrial appendage appears unchanged from pre-bypass. - Aortic Valve: The aortic valve appears unchanged from pre-bypass. - Mitral Valve: The mitral valve appears unchanged from pre-bypass. - Tricuspid Valve: The tricuspid valve appears unchanged from pre-bypass. - Interatrial Septum: The interatrial septum appears unchanged from pre-bypass. - Interventricular Septum: The interventricular septum appears unchanged from pre-bypass. - Pericardium: The pericardium appears unchanged from pre-bypass. PRE-OP FINDINGS  Left Ventricle: The left ventricle has mild-moderately reduced systolic function, with an ejection fraction of 40-45%. The cavity size was normal. There is no increase in left ventricular wall thickness. Right Ventricle: The right ventricle has normal systolic function. The cavity was normal. There is no increase in right ventricular wall thickness. Left Atrium: Left atrial size was normal in size. There is continuous echo contrast seen in the left atrial cavity. The left atrial appendage is well visualized and there is no evidence of thrombus present. Right Atrium: Right atrial size was normal in size. Interatrial Septum: No atrial level shunt detected by color flow Doppler. Pericardium: There is no evidence of pericardial effusion. Mitral Valve: The mitral valve is normal in structure. Mitral valve regurgitation is mild by color flow Doppler. The MR jet is posteriorly-directed. There is No evidence of mitral stenosis. Tricuspid Valve: The tricuspid valve was normal in structure. Tricuspid valve regurgitation was not visualized by color flow Doppler. Aortic Valve: The aortic valve is tricuspid Aortic valve regurgitation was not visualized by color flow Doppler. There is no evidence of aortic valve stenosis. Pulmonic Valve: The pulmonic valve was not well visualized. Pulmonic valve regurgitation is not visualized by color flow Doppler. Aorta: The aortic root,  ascending aorta and aortic arch are normal in size and structure.  Renold Don MD Electronically signed  by Renold Don MD Signature Date/Time: 11/21/2020/2:30:23 PM    Final     Cardiac Studies   TEE:  Mild ischemic MR at decreased loading condition; mildly decreased EF  Patient Profile     70 y.o. male S/p CABG  Assessment & Plan    NSTEMI DM with HLD - asymptomatic  - anatomy: LIMA to LAD, SVG to D1, SVT to PDA sequential PLB, RIMA to OM - continue ASA  - continue statin and zetia, goal LDL < 70 - Would returrn low dose coreg as first medication back (still on low dose phenylephrine) - Has inpatient cardiac rehab   Heart Failure mildly reduced Ejection Fraction  Obesity/DM/ HTN OSA on CPAP Ischemic MR - NYHA class I, Stage B-C, slightly hypervolemic, etiology from NSTEMI - Diuretic regimen: euvolemic presently was on 40 mg IV BID pre surgery - Strict I/Os, daily weights, and fluid restriction of < 2 L  - Replace electrolytes PRN and keep K>4 and Mg>2. - pre surgery was on aldactone 12.5, irbesartan 300, coreg 12.5 mg   Consider returning lasix 40 mg IV Daily if surgery in agreement  Dementia NOS - on donepazeil  Leg Cellulitis - Patient is on Linezolid  For questions or updates, please contact [facility] Please consult [email or web address] for contact info under Cardiology/STEMI.      Signed, Werner Lean, MD  11/22/2020, 7:42 AM

## 2020-11-22 NOTE — Progress Notes (Signed)
[address]       [place] [address]             [phone]        CARDIOTHORACIC SURGERY PROGRESS NOTE   R1 Day Post-Op Procedure(s) (LRB): CORONARY ARTERY BYPASS GRAFTING (CABG) TIMES FOUR ON PUMP USING BILATERAL INTERNAL MAMMARY ARTERIES AND ENDOSCOPICALLY HARVESTED LEFT GREATER SAPHENOUS VEIN (N/A) TRANSESOPHAGEAL ECHOCARDIOGRAM (TEE) (N/A) INDOCYANINE GREEN FLUORESCENCE IMAGING (ICG) (N/A) ENDOVEIN HARVEST OF GREATER SAPHENOUS VEIN (Left)  Subjective: Looks okay.  Feels sore and tired.  Denies SOB or nausea  Objective: Vital signs: BP Readings from Last 1 Encounters:  11/22/20 113/75   Pulse Readings from Last 1 Encounters:  11/22/20 89   Resp Readings from Last 1 Encounters:  11/22/20 14   Temp Readings from Last 1 Encounters:  11/22/20 97.88 F (36.6 C)    Hemodynamics: PAP: (26-47)/(14-33) 38/22 CO:  [3.6 L/min-7.8 L/min] 7.8 L/min CI:  [1.7 L/min/m2-3.7 L/min/m2] 3.7 L/min/m2  Physical Exam:  Rhythm:   Sinus - AAI paced  Breath sounds: Shallow but clear  Heart sounds:  RRR  Incisions:  Dressings dry, intact  Abdomen:  Soft, non-distended, non-tender  Extremities:  Warm, well-perfused  Chest tubes:  Decreasing volume thin serosanguinous output, no air leak    Intake/Output from previous day: 02/11 0701 - 02/12 0700 In: 6742.7 [P.O.:480; I.V.:5264.6; Blood:250; IV Piggyback:748.1] Out: 2366 [Urine:1420; Blood:455; Chest Tube:491] Intake/Output this shift: Total I/O In: -  Out: 155 [Urine:75; Chest Tube:80]  Lab Results:  CBC: Recent Labs    11/21/20 2030 11/22/20 0544  WBC 17.8* 12.9*  HGB 9.9* 9.0*  HCT 30.5* 28.1*  PLT 317 268    BMET:  Recent Labs    11/21/20 2030 11/22/20 0544  NA 135 135  K 4.6 4.3  CL 103 102  CO2 24 24  GLUCOSE 118* 143*  BUN 24* 26*  CREATININE 0.90 1.02  CALCIUM 7.9* 7.8*     PT/INR:   Recent Labs    11/21/20 1815  LABPROT 14.7  INR 1.2    CBG (last 3)  Recent Labs     11/22/20 0451 11/22/20 0650 11/22/20 0856  GLUCAP 120* 132* 238*    ABG    Component Value Date/Time   PHART 7.418 11/21/2020 2003   PCO2ART 41.4 11/21/2020 2003   PO2ART 89 11/21/2020 2003   HCO3 26.7 11/21/2020 2003   TCO2 28 11/21/2020 2003   O2SAT 97.0 11/21/2020 2003    CXR: PORTABLE CHEST 1 VIEW  COMPARISON:  Chest x-ray from yesterday.  FINDINGS: Interval removal of the endotracheal and enteric tubes. Unchanged bilateral chest tubes and mediastinal drain. Unchanged right internal jugular Swan-Ganz catheter. Stable cardiomediastinal silhouette with normal heart size status post CABG. Normal pulmonary vascularity. Slightly worsened atelectasis at the left lung base. No pleural effusion or pneumothorax. No acute osseous abnormality.  IMPRESSION: 1. Interval extubation. Slightly worsened left basilar atelectasis.   Electronically Signed   By: Titus Dubin M.D.   On: 11/22/2020 08:55   EKG: Sinus brady w/out acute ischemic changes, RBBB (old)   Assessment/Plan: S/P Procedure(s) (LRB): CORONARY ARTERY BYPASS GRAFTING (CABG) TIMES FOUR ON PUMP USING BILATERAL INTERNAL MAMMARY ARTERIES AND ENDOSCOPICALLY HARVESTED LEFT GREATER SAPHENOUS VEIN (N/A) TRANSESOPHAGEAL ECHOCARDIOGRAM (TEE) (N/A) INDOCYANINE GREEN FLUORESCENCE IMAGING (ICG) (N/A) ENDOVEIN HARVEST OF GREATER SAPHENOUS VEIN (Left)  Overall doing well POD1 Maintaining AAI paced rhythm w/ stable hemodynamics, no drips Breathing comfortably on nasal cannula, CXR looks okay Expected post  op acute blood loss anemia, Hgb 9.0 stable Expected post op atelectasis, mild Expected post op volume excess, weight reportedly 10 kg > preop, UOP adequate   Mobilize  D/C lines  Diuresis  Leave tubes in place for now   Rexene Alberts, MD 11/22/2020 9:50 AM

## 2020-11-22 NOTE — Plan of Care (Signed)

## 2020-11-22 NOTE — Progress Notes (Signed)
Basal insulin not ordered this AM as patient's blood sugars had been well controlled overnight. Throughout the day patient's blood sugars have been increasing, despite minimal PO intake. After discussing with patient, it was discovered he's a type 1 diabetic. Dr. Roxy Manns notified of the need for basal insulin. Stated he would look into later or have to have the night shift NP address it. He later called back and stated to have the diabetic coordinator place orders per their recommendations.  Joellen Jersey, RN

## 2020-11-23 ENCOUNTER — Inpatient Hospital Stay (HOSPITAL_COMMUNITY): Payer: HMO

## 2020-11-23 DIAGNOSIS — I5020 Unspecified systolic (congestive) heart failure: Secondary | ICD-10-CM | POA: Diagnosis not present

## 2020-11-23 DIAGNOSIS — G4733 Obstructive sleep apnea (adult) (pediatric): Secondary | ICD-10-CM | POA: Diagnosis not present

## 2020-11-23 DIAGNOSIS — E1169 Type 2 diabetes mellitus with other specified complication: Secondary | ICD-10-CM | POA: Diagnosis not present

## 2020-11-23 DIAGNOSIS — Z9989 Dependence on other enabling machines and devices: Secondary | ICD-10-CM | POA: Diagnosis not present

## 2020-11-23 LAB — BASIC METABOLIC PANEL
Anion gap: 10 (ref 5–15)
BUN: 32 mg/dL — ABNORMAL HIGH (ref 8–23)
CO2: 25 mmol/L (ref 22–32)
Calcium: 8.1 mg/dL — ABNORMAL LOW (ref 8.9–10.3)
Chloride: 100 mmol/L (ref 98–111)
Creatinine, Ser: 1.05 mg/dL (ref 0.61–1.24)
GFR, Estimated: >60 mL/min (ref >60)
Glucose, Bld: 147 mg/dL — ABNORMAL HIGH (ref 70–99)
Potassium: 3.9 mmol/L (ref 3.5–5.1)
Sodium: 135 mmol/L (ref 135–145)

## 2020-11-23 LAB — CBC
HCT: 26 % — ABNORMAL LOW (ref 39.0–52.0)
Hemoglobin: 8.8 g/dL — ABNORMAL LOW (ref 13.0–17.0)
MCH: 33 pg (ref 26.0–34.0)
MCHC: 33.8 g/dL (ref 30.0–36.0)
MCV: 97.4 fL (ref 80.0–100.0)
Platelets: 248 10*3/uL (ref 150–400)
RBC: 2.67 MIL/uL — ABNORMAL LOW (ref 4.22–5.81)
RDW: 13.5 % (ref 11.5–15.5)
WBC: 14.6 10*3/uL — ABNORMAL HIGH (ref 4.0–10.5)
nRBC: 0 % (ref 0.0–0.2)

## 2020-11-23 LAB — GLUCOSE, CAPILLARY
Glucose-Capillary: 121 mg/dL — ABNORMAL HIGH (ref 70–99)
Glucose-Capillary: 124 mg/dL — ABNORMAL HIGH (ref 70–99)
Glucose-Capillary: 147 mg/dL — ABNORMAL HIGH (ref 70–99)
Glucose-Capillary: 150 mg/dL — ABNORMAL HIGH (ref 70–99)
Glucose-Capillary: 192 mg/dL — ABNORMAL HIGH (ref 70–99)
Glucose-Capillary: 240 mg/dL — ABNORMAL HIGH (ref 70–99)

## 2020-11-23 MED ORDER — FUROSEMIDE 10 MG/ML IJ SOLN
80.0000 mg | Freq: Two times a day (BID) | INTRAMUSCULAR | Status: DC
  Administered 2020-11-23 – 2020-11-27 (×8): 80 mg via INTRAVENOUS
  Filled 2020-11-23 (×8): qty 8

## 2020-11-23 MED ORDER — POTASSIUM CHLORIDE CRYS ER 20 MEQ PO TBCR
20.0000 meq | EXTENDED_RELEASE_TABLET | Freq: Two times a day (BID) | ORAL | Status: DC
  Administered 2020-11-23 – 2020-11-24 (×4): 20 meq via ORAL
  Filled 2020-11-23 (×4): qty 1

## 2020-11-23 MED ORDER — INSULIN GLARGINE 100 UNIT/ML ~~LOC~~ SOLN
28.0000 [IU] | Freq: Every day | SUBCUTANEOUS | Status: DC
  Administered 2020-11-24: 28 [IU] via SUBCUTANEOUS
  Filled 2020-11-23 (×2): qty 0.28

## 2020-11-23 MED ORDER — FUROSEMIDE 10 MG/ML IJ SOLN: 60.0000 mg | Freq: Two times a day (BID) | INTRAMUSCULAR | Status: DC

## 2020-11-23 MED ORDER — MELATONIN 3 MG PO TABS
3.0000 mg | ORAL_TABLET | Freq: Every evening | ORAL | Status: DC | PRN
  Administered 2020-11-23 – 2020-11-26 (×3): 3 mg via ORAL
  Filled 2020-11-23 (×3): qty 1

## 2020-11-23 MED ORDER — ~~LOC~~ CARDIAC SURGERY, PATIENT & FAMILY EDUCATION: Freq: Once | Status: AC

## 2020-11-23 NOTE — Plan of Care (Signed)
°  Problem: Education: °Goal: Knowledge of General Education information will improve °Description: Including pain rating scale, medication(s)/side effects and non-pharmacologic comfort measures °Outcome: Progressing °  °Problem: Clinical Measurements: °Goal: Ability to maintain clinical measurements within normal limits will improve °Outcome: Progressing °  °Problem: Activity: °Goal: Risk for activity intolerance will decrease °Outcome: Progressing °  °Problem: Nutrition: °Goal: Adequate nutrition will be maintained °Outcome: Progressing °  °Problem: Coping: °Goal: Level of anxiety will decrease °Outcome: Progressing °  °Problem: Pain Managment: °Goal: General experience of comfort will improve °Outcome: Progressing °  °

## 2020-11-23 NOTE — Progress Notes (Signed)
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  CABG 11/22/20 with LIMA to LAD, SVG to D1, SVT to PDA sequential PLB, RIMA to OM on 11/22/20. Started diuresis 11/23/20.  No chest pain or pressure.  Persistent SOB, cough and congestion.  Able to do rehab.  Off Phenylephrine.  Inpatient Medications    Scheduled Meds: . acetaminophen (TYLENOL) oral liquid 160 mg/5 mL  650 mg Per Tube Once  . acetaminophen  1,000 mg Oral Q6H  . aspirin EC  325 mg Oral Daily  . atorvastatin  80 mg Oral Daily  . bisacodyl  10 mg Oral Daily   Or  . bisacodyl  10 mg Rectal Daily  . busPIRone  15 mg Oral QPM  . busPIRone  30 mg Oral Q0600  . Chlorhexidine Gluconate Cloth  6 each Topical Daily  . docusate sodium  200 mg Oral Daily  . donepezil  5 mg Oral QHS  . enoxaparin (LOVENOX) injection  40 mg Subcutaneous QHS  . ezetimibe  10 mg Oral Daily  . FLUoxetine  20 mg Oral Daily  . folic acid  1 mg Oral Daily  . furosemide  40 mg Intravenous BID  . hydrocerin   Topical Daily  . insulin aspart  0-24 Units Subcutaneous Q4H  . insulin glargine  18 Units Subcutaneous Daily  . linezolid  600 mg Oral Q12H  . loratadine  10 mg Oral Daily  . metoprolol tartrate  12.5 mg Oral BID  . nutrition supplement (JUVEN)  1 packet Oral BID BM  . pantoprazole  40 mg Oral Daily  . Ensure Max Protein  11 oz Oral QHS  . sodium chloride flush  3 mL Intravenous Q12H  . terazosin  5 mg Oral QHS  . thiamine  100 mg Oral Daily   Or  . thiamine  100 mg Intravenous Daily   Continuous Infusions: . sodium chloride    . cefUROXime (ZINACEF)  IV Stopped (11/22/20 2228)  . lactated ringers    .  lactated ringers 20 mL/hr at 11/23/20 0800   PRN Meds: dextrose, diphenhydrAMINE, metoprolol tartrate, morphine injection, ondansetron (ZOFRAN) IV, oxyCODONE, polyethylene glycol, sodium chloride flush, temazepam, traMADol   Vital Signs    Vitals:   11/23/20 0758 11/23/20 0800 11/23/20 0830 11/23/20 0900  BP:  130/67 122/67   Pulse:  89 80 80  Resp:  (!) 23 14 15   Temp: 97.9 F (36.6 C)     TempSrc:      SpO2:  98% 98% 98%  Weight:      Height:        Intake/Output Summary (Last 24 hours) at 11/23/2020 0917 Last data filed at 11/23/2020 0820 Gross per 24 hour  Intake 1745.36 ml  Output 2150 ml  Net -404.64 ml   Filed Weights   11/21/20 0809 11/22/20 0500 11/23/20 0600  Weight: 98.8 kg 108.5 kg 109.4 kg    Telemetry    Predominantely paced with occasion PVCs, presently in SR - Personally Reviewed  ECG    No new - Personally Reviewed 11/22/20  Physical Exam   GEN: Talkative pre PT; resting comfortable post PT Neck: R IJ Swan/d/i;  Cardiac: RRR, no murmurs, rubs, or gallops.  Respiratory: Coarse breath sounds all lung fields with good effort GI: Soft, nontender, non-distended  Neuro:  Nonfocal  Psych: Normal affect   Labs    Chemistry Recent Labs  Lab 11/17/20 0249 11/18/20 0138 11/18/20 1238 11/22/20 0544 11/22/20 1559 11/23/20 0536  NA 134* 137   < > 135 133* 135  K 4.6 4.0   < > 4.3 4.3 3.9  CL 98 101   < > 102 99 100  CO2 26 24   < > 24 25 25   GLUCOSE 188* 57*   < > 143* 314* 147*  BUN 57* 46*   < > 26* 28* 32*  CREATININE 1.15 0.94   < > 1.02 1.05 1.05  CALCIUM 8.8* 9.1   < > 7.8* 7.9* 8.1*  ALBUMIN 2.0* 2.2*  --   --   --   --   GFRNONAA >60 >60   < > >60 >60 >60  ANIONGAP 10 12   < > 9 9 10    < > = values in this interval not displayed.     Hematology Recent Labs  Lab 11/22/20 0544 11/22/20 1559 11/23/20 0536  WBC 12.9* 13.9* 14.6*  RBC 2.81* 2.84* 2.67*  HGB 9.0* 9.1* 8.8*  HCT 28.1* 28.0* 26.0*  MCV 100.0 98.6 97.4  MCH 32.0 32.0  33.0  MCHC 32.0 32.5 33.8  RDW 13.5 13.5 13.5  PLT 268 267 248    Cardiac EnzymesNo results for input(s): TROPONINI in the last 168 hours. No results for input(s): TROPIPOC in the last 168 hours.   BNPNo results for input(s): BNP, PROBNP in the last 168 hours.   DDimer No results for input(s): DDIMER in the last 168 hours.   Radiology    DG Chest Port 1 View  Result Date: 11/23/2020 CLINICAL DATA:  Atelectasis.  Hypertension. EXAM: PORTABLE CHEST 1 VIEW COMPARISON:  November 22, 2020 FINDINGS: Swan-Ganz catheter has been removed. Cordis tip is in the superior vena cava. Chest tubes bilaterally unchanged in position. No pneumothorax. Equivocal pleural effusions bilaterally with slight bibasilar atelectasis noted. No edema or airspace opacity. There is stable cardiac prominence with pulmonary vascularity within normal limits. No adenopathy. There is aortic atherosclerosis. No bone lesions. IMPRESSION: Tube and catheter positions as described without pneumothorax. Equivocal pleural effusions bilaterally with mild bibasilar atelectasis. Stable cardiac silhouette. Aortic Atherosclerosis (ICD10-I70.0). Electronically Signed   By: Lowella Grip III M.D.   On: 11/23/2020 08:14   DG Chest Port 1 View  Result Date: 11/22/2020 CLINICAL DATA:  Status post CABG. EXAM: PORTABLE CHEST 1 VIEW COMPARISON:  Chest x-ray from yesterday. FINDINGS: Interval removal of the endotracheal and enteric tubes. Unchanged bilateral chest tubes and mediastinal drain. Unchanged right internal jugular Swan-Ganz catheter. Stable cardiomediastinal silhouette with normal heart size status post CABG. Normal pulmonary vascularity. Slightly worsened atelectasis at the left lung base. No pleural effusion or pneumothorax. No acute osseous abnormality. IMPRESSION: 1. Interval extubation. Slightly worsened left basilar atelectasis. Electronically Signed   By: Titus Dubin M.D.   On: 11/22/2020 08:55   DG Chest Port 1  View  Result Date: 11/21/2020 CLINICAL DATA:  CABG EXAM: PORTABLE CHEST 1 VIEW COMPARISON:  11/20/2020 FINDINGS: Postop CABG. Endotracheal tube in good position. Swan-Ganz catheter in the right pulmonary artery. Bilateral chest tubes in place. NG tube in the stomach Negative for pneumothorax. Mild atelectasis in the bases. No significant pleural effusion. Negative for edema. IMPRESSION: Postop CABG.  No complication.  Negative for pneumothorax. Electronically Signed   By: Franchot Gallo M.D.   On: 11/21/2020 15:54   ECHO INTRAOPERATIVE TEE  Result Date: 11/21/2020  *INTRAOPERATIVE TRANSESOPHAGEAL REPORT *  Patient Name:   CHRISTIFER CHAPDELAINE Date of Exam: 11/21/2020 Medical Rec #:  [id number]       Height:       68.0 in Accession #:    [id number]      Weight:       217.8 lb Date of Birth:  1951/08/07       BSA:          2.12 m Patient Age:    70 years        BP:           107/59 mmHg Patient Gender: M               HR:           65 bpm. Exam Location:  Anesthesiology Transesophogeal exam was perform intraoperatively during surgical procedure. Patient was closely monitored under general anesthesia during the entirety of examination. Indications:     CAD Sonographer:     Dustin Flock RDCS Performing Phys: [id number] TGGYIRS Z ATKINS Diagnosing Phys: Renold Don MD Complications: No known complications during this procedure. POST-OP IMPRESSIONS - Left Ventricle: The left ventricle is unchanged from pre-bypass. - Right Ventricle: The right ventricle appears unchanged from pre-bypass. - Aorta: The aorta appears unchanged from pre-bypass. - Left Atrium: The left atrium appears unchanged from pre-bypass. - Left Atrial Appendage: The left atrial appendage appears unchanged from pre-bypass. - Aortic Valve: The aortic valve appears unchanged from pre-bypass. - Mitral Valve: The mitral valve appears unchanged from pre-bypass. - Tricuspid Valve: The tricuspid valve appears unchanged from pre-bypass. - Interatrial Septum: The  interatrial septum appears unchanged from pre-bypass. - Interventricular Septum: The interventricular septum appears unchanged from pre-bypass. - Pericardium: The pericardium appears unchanged from pre-bypass. PRE-OP FINDINGS  Left Ventricle: The left ventricle has mild-moderately reduced systolic function, with an ejection fraction of 40-45%. The cavity size was normal. There is no increase in left ventricular wall thickness. Right Ventricle: The right ventricle has normal systolic function. The cavity was normal. There is no increase in right ventricular wall thickness. Left Atrium: Left atrial size was normal in size. There is continuous echo contrast seen in the left atrial cavity. The left atrial appendage is well visualized and there is no evidence of thrombus present. Right Atrium: Right atrial size was normal in size. Interatrial Septum: No atrial level shunt detected by color flow Doppler. Pericardium: There is no evidence of pericardial effusion. Mitral Valve: The mitral valve is normal in structure. Mitral valve regurgitation is mild by color flow Doppler. The MR jet is posteriorly-directed. There is No evidence of mitral stenosis. Tricuspid Valve: The tricuspid valve was normal in structure. Tricuspid valve regurgitation was not visualized by color flow Doppler. Aortic Valve: The aortic valve is tricuspid Aortic valve regurgitation was not visualized by color flow Doppler. There is no evidence of aortic valve stenosis. Pulmonic Valve: The pulmonic valve was not well visualized. Pulmonic valve regurgitation is not visualized by color flow Doppler. Aorta: The aortic root, ascending aorta and aortic arch are normal in size and structure.  Renold Don MD Electronically signed by Renold Don MD Signature Date/Time: 11/21/2020/2:30:23 PM    Final     Cardiac Studies   TEE:  Mild ischemic MR at decreased loading condition; mildly decreased EF  Patient Profile  70 y.o. male S/p CABG  Assessment &  Plan    NSTEMI DM with HLD - asymptomatic  - anatomy: LIMA to LAD, SVG to D1, SVT to PDA sequential PLB, RIMA to OM - continue ASA  - continue statin and zetia, goal LDL < 70 - will need back on Coreg peridischarge - Has inpatient cardiac rehab   Heart Failure mildly reduced Ejection Fraction  Obesity/DM/ HTN OSA on CPAP Ischemic MR - NYHA class I, Stage B-C, slightly hypervolemic, etiology from NSTEMI - Diuretic regimen: euvolemic presently was on 40 mg IV BID pre surgery - Strict I/Os, daily weights, and fluid restriction of < 2 L  - Replace electrolytes PRN and keep K>4 and Mg>2. - pre surgery was on aldactone 12.5, irbesartan 300, coreg 12.5 mg  - Increased lasix to 60 mg IV BID - Chest PT order placed  Dementia NOS - on donepazeil  Leg Cellulitis - Patient is on Linezolid  For questions or updates, please contact [facility] Please consult [email or web address] for contact info under Cardiology/STEMI.      Signed, Werner Lean, MD  11/23/2020, 9:17 AM

## 2020-11-23 NOTE — Progress Notes (Signed)
      Jeremy Hickman       Jeremy Hickman,Jeremy Hickman [id number]             [phone]                 2 Days Post-Op Procedure(s) (LRB): CORONARY ARTERY BYPASS GRAFTING (CABG) TIMES FOUR ON PUMP USING BILATERAL INTERNAL MAMMARY ARTERIES AND ENDOSCOPICALLY HARVESTED LEFT GREATER SAPHENOUS VEIN (N/A) TRANSESOPHAGEAL ECHOCARDIOGRAM (TEE) (N/A) INDOCYANINE GREEN FLUORESCENCE IMAGING (ICG) (N/A) ENDOVEIN HARVEST OF GREATER SAPHENOUS VEIN (Left)   Events: Jeremy Hickman in bed, reports feeling bored. Didn't sleep well last night. Did some walking today.  He notes pain is about 5 out of 10.   _______________________________________________________________ Vitals: BP 130/66 (BP Location: Right Arm)   Pulse 80   Temp 98.1 F (36.7 C) (Oral)   Resp 18   Ht 5\' 8"  (1.727 m)   Wt 109.4 kg Comment: Simultaneous filing. User may not have seen previous data.  SpO2 96%   BMI 36.66 kg/m   - Neuro: Alert and oriented x 3.    - Cardiovascular: Dressing clean dry and intact. Regular rate and rhythm.     - Pulm: On RA.  Clear.    - Abd: Soft, nontender or distended.    - Extremity: RLE > LLE (this is baseline).  Edema present.   .Intake/Output      02/13 0701 02/14 0700   P.O. 680   I.V. (mL/kg) 152.9 (1.4)   IV Piggyback 200   Total Intake(mL/kg) 1032.9 (9.4)   Urine (mL/kg/hr) 1275 (0.9)   Drains 0   Stool 1   Chest Tube 50   Total Output 1326   Net -293.1       Stool Occurrence 1 x      _______________________________________________________________ Labs: CBC Latest Ref Rng & Units 11/23/2020 11/22/2020 11/22/2020  WBC 4.0 - 10.5 K/uL 14.6(H) 13.9(H) 12.9(H)  Hemoglobin 13.0 - 17.0 g/dL 8.8(L) 9.1(L) 9.0(L)  Hematocrit 39.0 - 52.0 % 26.0(L) 28.0(L) 28.1(L)  Platelets 150 - 400 K/uL 248 267 268   CMP Latest Ref Rng & Units 11/23/2020 11/22/2020 11/22/2020  Glucose 70 - 99 mg/dL 147(H) 314(H) 143(H)  BUN 8 - 23 mg/dL 32(H) 28(H) 26(H)  Creatinine 0.61 - 1.24 mg/dL 1.05  1.05 1.02  Sodium 135 - 145 mmol/L 135 133(L) 135  Potassium 3.5 - 5.1 mmol/L 3.9 4.3 4.3  Chloride 98 - 111 mmol/L 100 99 102  CO2 22 - 32 mmol/L 25 25 24   Calcium 8.9 - 10.3 mg/dL 8.1(L) 7.9(L) 7.8(L)  Total Protein 6.5 - 8.1 g/dL - - -  Total Bilirubin 0.3 - 1.2 mg/dL - - -  Alkaline Phos 38 - 126 U/L - - -  AST 15 - 41 U/L - - -  ALT 0 - 44 U/L - - -    _______________________________________________________________  Assessment and Plan: S/P Procedure(s) (LRB): CORONARY ARTERY BYPASS GRAFTING (CABG) TIMES FOUR ON PUMP USING BILATERAL INTERNAL MAMMARY ARTERIES AND ENDOSCOPICALLY HARVESTED LEFT GREATER SAPHENOUS VEIN (N/A) TRANSESOPHAGEAL ECHOCARDIOGRAM (TEE) (N/A) INDOCYANINE GREEN FLUORESCENCE IMAGING (ICG) (N/A) ENDOVEIN HARVEST OF GREATER SAPHENOUS VEIN (Left)  Hemodynamically stable, continue plan, added melatonin PRN to help with sleep.   Carlene Coria 11/23/2020 8:42 PM

## 2020-11-23 NOTE — Progress Notes (Signed)
RT placed patient on CPAP with 2L O2 bled into circuit. Patient tolerating well at this time. RT will monitor as needed. 

## 2020-11-23 NOTE — Progress Notes (Addendum)
[address]       [place] [address]             [phone]        CARDIOTHORACIC SURGERY PROGRESS NOTE   R2 Days Post-Op Procedure(s) (LRB): CORONARY ARTERY BYPASS GRAFTING (CABG) TIMES FOUR ON PUMP USING BILATERAL INTERNAL MAMMARY ARTERIES AND ENDOSCOPICALLY HARVESTED LEFT GREATER SAPHENOUS VEIN (N/A) TRANSESOPHAGEAL ECHOCARDIOGRAM (TEE) (N/A) INDOCYANINE GREEN FLUORESCENCE IMAGING (ICG) (N/A) ENDOVEIN HARVEST OF GREATER SAPHENOUS VEIN (Left)  Subjective: Just walked.  Feels sore and tired.  Poor cough. Marginal appetite.  Denies SOB although dyspneic with walking    Objective: Vital signs: BP Readings from Last 1 Encounters:  11/23/20 122/67   Pulse Readings from Last 1 Encounters:  11/23/20 80   Resp Readings from Last 1 Encounters:  11/23/20 15   Temp Readings from Last 1 Encounters:  11/23/20 97.9 F (36.6 C)    Hemodynamics:    Physical Exam:  Rhythm:   sinus  Breath sounds: Scattered rhonchi  Heart sounds:  RRR  Incisions:  Dressing dry, intact  Abdomen:  Soft, non-distended, non-tender  Extremities:  Warm, well-perfused  Chest tubes:  decreasing volume thin serosanguinous output, no air leak    Intake/Output from previous day: 02/12 0701 - 02/13 0700 In: 1794.5 [P.O.:1160; I.V.:534.5; IV Piggyback:100] Out: 1885 [Urine:1425; Chest Tube:460] Intake/Output this shift: Total I/O In: 419.6 [P.O.:240; I.V.:79.6; IV Piggyback:100] Out: 600 [Urine:550; Chest Tube:50]  Lab Results:  CBC: Recent Labs    11/22/20 1559 11/23/20 0536  WBC 13.9* 14.6*  HGB 9.1* 8.8*  HCT 28.0* 26.0*  PLT 267 248    BMET:  Recent Labs    11/22/20 1559 11/23/20 0536  NA 133* 135  K 4.3 3.9  CL 99 100  CO2 25 25  GLUCOSE 314* 147*  BUN 28* 32*  CREATININE 1.05 1.05  CALCIUM 7.9* 8.1*     PT/INR:   Recent Labs    11/21/20 1815  LABPROT 14.7  INR 1.2    CBG (last 3)  Recent Labs    11/23/20 0011 11/23/20 0431  11/23/20 0747  GLUCAP 240* 147* 124*    ABG    Component Value Date/Time   PHART 7.418 11/21/2020 2003   PCO2ART 41.4 11/21/2020 2003   PO2ART 89 11/21/2020 2003   HCO3 26.7 11/21/2020 2003   TCO2 28 11/21/2020 2003   O2SAT 97.0 11/21/2020 2003    CXR: PORTABLE CHEST 1 VIEW  COMPARISON:  November 22, 2020  FINDINGS: Swan-Ganz catheter has been removed. Cordis tip is in the superior vena cava. Chest tubes bilaterally unchanged in position. No pneumothorax. Equivocal pleural effusions bilaterally with slight bibasilar atelectasis noted. No edema or airspace opacity. There is stable cardiac prominence with pulmonary vascularity within normal limits. No adenopathy. There is aortic atherosclerosis. No bone lesions.  IMPRESSION: Tube and catheter positions as described without pneumothorax. Equivocal pleural effusions bilaterally with mild bibasilar atelectasis. Stable cardiac silhouette. Aortic Atherosclerosis (ICD10-I70.0).   Electronically Signed   By: Lowella Grip III M.D.   On: 11/23/2020 08:14    Assessment/Plan: S/P Procedure(s) (LRB): CORONARY ARTERY BYPASS GRAFTING (CABG) TIMES FOUR ON PUMP USING BILATERAL INTERNAL MAMMARY ARTERIES AND ENDOSCOPICALLY HARVESTED LEFT GREATER SAPHENOUS VEIN (N/A) TRANSESOPHAGEAL ECHOCARDIOGRAM (TEE) (N/A) INDOCYANINE GREEN FLUORESCENCE IMAGING (ICG) (N/A) ENDOVEIN HARVEST OF GREATER SAPHENOUS VEIN (Left)  Overall stable POD2 Maintaining sinus rhythm w/ stable BP Breathing comfortably on nasal cannula, CXR looks okay Expected post op acute blood loss anemia,  Hgb stable Expected post op atelectasis, mild Expected post op volume excess, weight still reportedly 10 kg > preop, UOP adequate Type I diabetes mellitus, adequate glycemic control   Mobilize  D/C tubes  Increase lantus insulin to dose recommended by Diabetes coordinator  Increase diuresis  Rexene Alberts, MD 11/23/2020 10:16 AM

## 2020-11-24 ENCOUNTER — Encounter (HOSPITAL_COMMUNITY): Payer: Self-pay | Admitting: Cardiothoracic Surgery

## 2020-11-24 ENCOUNTER — Inpatient Hospital Stay (HOSPITAL_COMMUNITY): Payer: HMO

## 2020-11-24 DIAGNOSIS — I214 Non-ST elevation (NSTEMI) myocardial infarction: Secondary | ICD-10-CM | POA: Diagnosis not present

## 2020-11-24 DIAGNOSIS — I5043 Acute on chronic combined systolic (congestive) and diastolic (congestive) heart failure: Secondary | ICD-10-CM | POA: Diagnosis not present

## 2020-11-24 DIAGNOSIS — Z951 Presence of aortocoronary bypass graft: Secondary | ICD-10-CM

## 2020-11-24 LAB — BASIC METABOLIC PANEL
Anion gap: 7 (ref 5–15)
BUN: 35 mg/dL — ABNORMAL HIGH (ref 8–23)
CO2: 28 mmol/L (ref 22–32)
Calcium: 8.4 mg/dL — ABNORMAL LOW (ref 8.9–10.3)
Chloride: 99 mmol/L (ref 98–111)
Creatinine, Ser: 1.04 mg/dL (ref 0.61–1.24)
GFR, Estimated: >60 mL/min (ref >60)
Glucose, Bld: 179 mg/dL — ABNORMAL HIGH (ref 70–99)
Potassium: 4.4 mmol/L (ref 3.5–5.1)
Sodium: 134 mmol/L — ABNORMAL LOW (ref 135–145)

## 2020-11-24 LAB — GLUCOSE, CAPILLARY
Glucose-Capillary: 139 mg/dL — ABNORMAL HIGH (ref 70–99)
Glucose-Capillary: 168 mg/dL — ABNORMAL HIGH (ref 70–99)
Glucose-Capillary: 214 mg/dL — ABNORMAL HIGH (ref 70–99)
Glucose-Capillary: 234 mg/dL — ABNORMAL HIGH (ref 70–99)
Glucose-Capillary: 258 mg/dL — ABNORMAL HIGH (ref 70–99)
Glucose-Capillary: 99 mg/dL (ref 70–99)

## 2020-11-24 LAB — TYPE AND SCREEN
ABO/RH(D): B POS
Antibody Screen: NEGATIVE
Unit division: 0
Unit division: 0
Unit division: 0
Unit division: 0

## 2020-11-24 LAB — BPAM RBC
Blood Product Expiration Date: 202203072359
Blood Product Expiration Date: 202203072359
Blood Product Expiration Date: 202203072359
Blood Product Expiration Date: 202203082359
ISSUE DATE / TIME: 202202110922
ISSUE DATE / TIME: 202202110922
Unit Type and Rh: 7300
Unit Type and Rh: 7300
Unit Type and Rh: 7300
Unit Type and Rh: 7300

## 2020-11-24 LAB — CBC
HCT: 26.3 % — ABNORMAL LOW (ref 39.0–52.0)
Hemoglobin: 8.8 g/dL — ABNORMAL LOW (ref 13.0–17.0)
MCH: 33 pg (ref 26.0–34.0)
MCHC: 33.5 g/dL (ref 30.0–36.0)
MCV: 98.5 fL (ref 80.0–100.0)
Platelets: 266 10*3/uL (ref 150–400)
RBC: 2.67 MIL/uL — ABNORMAL LOW (ref 4.22–5.81)
RDW: 13.9 % (ref 11.5–15.5)
WBC: 13.5 10*3/uL — ABNORMAL HIGH (ref 4.0–10.5)
nRBC: 0 % (ref 0.0–0.2)

## 2020-11-24 MED ORDER — INSULIN ASPART 100 UNIT/ML ~~LOC~~ SOLN
0.0000 [IU] | Freq: Three times a day (TID) | SUBCUTANEOUS | Status: DC
  Administered 2020-11-24: 8 [IU] via SUBCUTANEOUS
  Administered 2020-11-25: 12 [IU] via SUBCUTANEOUS
  Administered 2020-11-25 (×3): 8 [IU] via SUBCUTANEOUS
  Administered 2020-11-26 (×2): 12 [IU] via SUBCUTANEOUS
  Administered 2020-11-26: 2 [IU] via SUBCUTANEOUS
  Administered 2020-11-26: 12 [IU] via SUBCUTANEOUS
  Administered 2020-11-27: 8 [IU] via SUBCUTANEOUS
  Administered 2020-11-27: 2 [IU] via SUBCUTANEOUS

## 2020-11-24 MED FILL — Potassium Chloride Inj 2 mEq/ML: INTRAVENOUS | Qty: 40 | Status: AC

## 2020-11-24 MED FILL — Magnesium Sulfate Inj 50%: INTRAMUSCULAR | Qty: 10 | Status: AC

## 2020-11-24 MED FILL — Heparin Sodium (Porcine) Inj 1000 Unit/ML: INTRAMUSCULAR | Qty: 30 | Status: AC

## 2020-11-24 NOTE — Progress Notes (Signed)
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  CABG 11/22/20 with LIMA to LAD, SVG to D1, SVT to PDA sequential PLB, RIMA to OM on 11/22/20. Started diuresis 11/23/20.  No issues overnight-  Diuresis increased yesterday. Net negative another -600 cc recorded (weight down 1 kg). H/H stable. BMET pending (creatinine 1.05).  Inpatient Medications    Scheduled Meds: . acetaminophen (TYLENOL) oral liquid 160 mg/5 mL  650 mg Per Tube Once  . acetaminophen  1,000 mg Oral Q6H  . aspirin EC  325 mg Oral Daily  . atorvastatin  80 mg Oral Daily  . bisacodyl  10 mg Oral Daily   Or  . bisacodyl  10 mg Rectal Daily  . busPIRone  15 mg Oral QPM  . busPIRone  30 mg Oral Q0600  . Chlorhexidine Gluconate Cloth  6 each Topical Daily  . docusate sodium  200 mg Oral Daily  . donepezil  5 mg Oral QHS  . enoxaparin (LOVENOX) injection  40 mg Subcutaneous QHS  . ezetimibe  10 mg Oral Daily  . FLUoxetine  20 mg Oral Daily  . folic acid  1 mg Oral Daily  . furosemide  80 mg Intravenous BID  . hydrocerin   Topical Daily  . insulin aspart  0-24 Units Subcutaneous Q4H  . insulin glargine  28 Units Subcutaneous Daily  . loratadine  10 mg Oral Daily  . metoprolol tartrate  12.5 mg Oral BID  . nutrition supplement (JUVEN)  1 packet Oral BID BM  . pantoprazole  40 mg Oral Daily  . potassium chloride  20 mEq Oral BID  . Ensure Max Protein  11 oz Oral QHS  . sodium chloride flush  3 mL Intravenous Q12H  . terazosin  5 mg Oral QHS  . thiamine  100 mg Oral Daily   Or  . thiamine  100 mg Intravenous Daily   Continuous Infusions: . sodium chloride    . lactated ringers     . lactated ringers Stopped (11/23/20 1229)   PRN Meds: dextrose, diphenhydrAMINE, melatonin, metoprolol tartrate, morphine injection, ondansetron (ZOFRAN) IV, oxyCODONE, polyethylene glycol, sodium chloride flush, temazepam, traMADol   Vital Signs    Vitals:   11/24/20 0200 11/24/20 0400 11/24/20 0500 11/24/20 0600  BP: (!) 105/55 112/62  124/64  Pulse: 68 69 70 71  Resp: 10 20 10 18   Temp:  97.9 F (36.6 C)    TempSrc:  Oral    SpO2: 95% 95% 96% 95%  Weight:   108.5 kg   Height:        Intake/Output Summary (Last 24 hours) at 11/24/2020 0809 Last data filed at 11/24/2020 0500 Gross per 24 hour  Intake 943.34 ml  Output 1806 ml  Net -862.66 ml   Filed Weights   11/22/20 0500 11/23/20 0600 11/24/20 0500  Weight: 108.5 kg 109.4 kg 108.5 kg    Telemetry    Paced rhythm with PVC's - personally reviewed  ECG    N/A  Physical Exam   General appearance: somnolent, no distress Neck: JVD - a few cm above sternal notch, no carotid bruit and thyroid not enlarged, symmetric, no tenderness/mass/nodules Lungs:  diminished breath sounds bibasilar Heart: regular rate and rhythm Abdomen: soft, non-tender; bowel sounds normal; no masses,  no organomegaly and obese, non-tender Extremities: edema 1+ bilateral LE edema, 1+ upper extremity edema Pulses: 2+ and symmetric Skin: Skin color, texture, turgor normal. No rashes or lesions Neurologic: Mental status: somnolent, but arouses to voice, oriented Psych: Pleasant   Labs    Chemistry Recent Labs  Lab 11/18/20 0138 11/18/20 1238 11/22/20 0544 11/22/20 1559 11/23/20 0536  NA 137   < > 135 133* 135  K 4.0   < > 4.3 4.3 3.9  CL 101   < > 102 99 100  CO2 24   < > 24 25 25   GLUCOSE 57*   < > 143* 314* 147*  BUN 46*   < > 26* 28* 32*  CREATININE 0.94   < > 1.02 1.05 1.05  CALCIUM 9.1   < > 7.8* 7.9* 8.1*  ALBUMIN 2.2*  --   --   --   --   GFRNONAA >60   < > >60 >60 >60  ANIONGAP 12   < > 9 9 10    < > = values in this  interval not displayed.     Hematology Recent Labs  Lab 11/22/20 1559 11/23/20 0536 11/24/20 0731  WBC 13.9* 14.6* 13.5*  RBC 2.84* 2.67* 2.67*  HGB 9.1* 8.8* 8.8*  HCT 28.0* 26.0* 26.3*  MCV 98.6 97.4 98.5  MCH 32.0 33.0 33.0  MCHC 32.5 33.8 33.5  RDW 13.5 13.5 13.9  PLT 267 248 266    Cardiac EnzymesNo results for input(s): TROPONINI in the last 168 hours. No results for input(s): TROPIPOC in the last 168 hours.   BNPNo results for input(s): BNP, PROBNP in the last 168 hours.   DDimer No results for input(s): DDIMER in the last 168 hours.   Radiology    DG Chest Port 1 View  Result Date: 11/24/2020 CLINICAL DATA:  Shortness of breath EXAM: PORTABLE CHEST 1 VIEW COMPARISON:  November 23, 2020 FINDINGS: Cordis has been removed. Chest tubes have been removed. No pneumothorax. There is mild atelectatic change in the right upper lobe. There is no edema or airspace opacity. There is cardiomegaly with pulmonary vascularity normal. Status post median sternotomy. There is aortic atherosclerosis. No adenopathy. No bone lesions. IMPRESSION: No pneumothorax. Mild atelectasis right upper lobe. No edema or airspace opacity. Stable cardiomegaly. Aortic Atherosclerosis (ICD10-I70.0). Electronically Signed   By: Lowella Grip III M.D.   On: 11/24/2020 08:01   DG Chest Port 1 View  Result Date: 11/23/2020 CLINICAL DATA:  Atelectasis.  Hypertension. EXAM: PORTABLE CHEST 1 VIEW COMPARISON:  November 22, 2020 FINDINGS: Swan-Ganz catheter has been removed. Cordis tip is in the superior vena cava. Chest tubes bilaterally unchanged in position. No pneumothorax. Equivocal pleural effusions bilaterally with slight bibasilar atelectasis noted. No edema or airspace opacity. There is stable cardiac prominence with pulmonary vascularity within normal limits. No adenopathy. There is aortic atherosclerosis. No bone lesions. IMPRESSION: Tube and catheter positions as described without pneumothorax. Equivocal  pleural effusions bilaterally with mild bibasilar atelectasis. Stable cardiac silhouette. Aortic Atherosclerosis (ICD10-I70.0). Electronically Signed   By: Lowella Grip III M.D.   On: 11/23/2020 08:14    Cardiac Studies   TEE:  Mild ischemic MR at decreased loading condition; mildly decreased EF 40-45%  Patient Profile     70 y.o. male S/p CABG  Assessment & Plan    NSTEMI DM with HLD - asymptomatic  - anatomy: LIMA  to LAD, SVG to D1, SVT to PDA sequential PLB, RIMA to OM - continue ASA  - continue statin and zetia, goal LDL < 70 - will need back on Coreg peridischarge - Has inpatient cardiac rehab   Heart Failure mildly reduced Ejection Fraction  Obesity/DM/ HTN OSA on CPAP Ischemic MR - NYHA class I, Stage B-C, slightly hypervolemic, etiology from NSTEMI - Diuretic regimen: euvolemic presently was on 40 mg IV BID pre surgery - Strict I/Os, daily weights, and fluid restriction of < 2 L  - Replace electrolytes PRN and keep K>4 and Mg>2. - pre surgery was on aldactone 12.5, irbesartan 300, coreg 12.5 mg  -Slightly more negative with lasix 60 mg IV BID - down 1kg, await creatinine, but appears volume overloaded - may consider TID lasix dosing if creatinine is stable -Consider resumption of lower dose home BP meds once euvolemic - bp normal at this point.  Dementia NOS - on donepazeil  Leg Cellulitis - Patient is on Linezolid  For questions or updates, please contact [place] Please consult [email or web address] for contact info under Cardiology/STEMI.   Pixie Casino, MD, [facility], [place] Director of the Advanced Lipid Disorders &  Cardiovascular Risk Reduction Clinic Diplomate of the American Board of Clinical Lipidology Attending Cardiologist  Direct Dial: [phone]  Fax: [phone]  Website:  [email or web address]  Pixie Casino, MD  11/24/2020, 8:09 AM

## 2020-11-24 NOTE — Progress Notes (Signed)
Inpatient Diabetes Program Recommendations  AACE/ADA: New Consensus Statement on Inpatient Glycemic Control (2015)  Target Ranges:  Prepandial:   less than 140 mg/dL      Peak postprandial:   less than 180 mg/dL (1-2 hours)      Critically ill patients:  140 - 180 mg/dL   Lab Results  Component Value Date   GLUCAP 258 (H) 11/24/2020   HGBA1C 7.1 (H) 11/21/2020    Review of Glycemic Control Results for NEZIAH, BRALEY (MRN [id number]) as of 11/24/2020 09:35  Ref. Range 11/23/2020 20:54 11/24/2020 00:28 11/24/2020 03:58 11/24/2020 08:17  Glucose-Capillary Latest Ref Range: 70 - 99 mg/dL 192 (H) 139 (H) 99 258 (H)   Diabetes history:Type 1 DM Outpatient Diabetes medications:Lantus 42 units q HS, Novolog 15 units tid with meals (patient manages his own doses to keep Current orders for Inpatient glycemic control: Novolog 0-24 units q 4 hours Lantus 28 units QD  Inpatient Diabetes Program Recommendations:    Lantus increased back to 28 units QD.   Once patient eating more consistently consider:  -Novolog 0-9 units TID & HS -Novolog 4 units TID (assuming patient is consuming >50% of meals).   Thanks, Bronson Curb, MSN, RNC-OB Diabetes Coordinator [phone] (8a-5p)

## 2020-11-24 NOTE — Discharge Instructions (Signed)

## 2020-11-24 NOTE — Progress Notes (Signed)
3 Days Post-Op Procedure(s) (LRB): CORONARY ARTERY BYPASS GRAFTING (CABG) TIMES FOUR ON PUMP USING BILATERAL INTERNAL MAMMARY ARTERIES AND ENDOSCOPICALLY HARVESTED LEFT GREATER SAPHENOUS VEIN (N/A) TRANSESOPHAGEAL ECHOCARDIOGRAM (TEE) (N/A) INDOCYANINE GREEN FLUORESCENCE IMAGING (ICG) (N/A) ENDOVEIN HARVEST OF GREATER SAPHENOUS VEIN (Left) Subjective: No complaints  Objective: Vital signs in last 24 hours: Temp:  [97 F (36.1 C)-98.1 F (36.7 C)] 97.6 F (36.4 C) (02/14 0700) Pulse Rate:  [68-85] 71 (02/14 0600) Cardiac Rhythm: Normal sinus rhythm (02/14 0600) Resp:  [9-25] 18 (02/14 0600) BP: (105-134)/(55-68) 124/64 (02/14 0600) SpO2:  [94 %-100 %] 95 % (02/14 0600) Weight:  [108.5 kg] 108.5 kg (02/14 0500)  Hemodynamic parameters for last 24 hours:    Intake/Output from previous day: 02/13 0701 - 02/14 0700 In: 1122.9 [P.O.:740; I.V.:182.9; IV Piggyback:200] Out: 1806 [Urine:1755; Stool:1; Chest Tube:50] Intake/Output this shift: No intake/output data recorded.  General appearance: no distress Neurologic: intact Heart: regular rate and rhythm, S1, S2 normal, no murmur, click, rub or gallop Lungs: clear to auscultation bilaterally Abdomen: soft, non-tender; bowel sounds normal; no masses,  no organomegaly Extremities: extremities normal, atraumatic, no cyanosis or edema Wound: c/d/i  Lab Results: Recent Labs    11/23/20 0536 11/24/20 0731  WBC 14.6* 13.5*  HGB 8.8* 8.8*  HCT 26.0* 26.3*  PLT 248 266   BMET:  Recent Labs    11/23/20 0536 11/24/20 0731  NA 135 134*  K 3.9 4.4  CL 100 99  CO2 25 28  GLUCOSE 147* 179*  BUN 32* 35*  CREATININE 1.05 1.04  CALCIUM 8.1* 8.4*    PT/INR:  Recent Labs    11/21/20 1815  LABPROT 14.7  INR 1.2   ABG    Component Value Date/Time   PHART 7.418 11/21/2020 2003   HCO3 26.7 11/21/2020 2003   TCO2 28 11/21/2020 2003   O2SAT 97.0 11/21/2020 2003   CBG (last 3)  Recent Labs    11/23/20 2054 11/24/20 0028  11/24/20 0358  GLUCAP 192* 139* 99    Assessment/Plan: S/P Procedure(s) (LRB): CORONARY ARTERY BYPASS GRAFTING (CABG) TIMES FOUR ON PUMP USING BILATERAL INTERNAL MAMMARY ARTERIES AND ENDOSCOPICALLY HARVESTED LEFT GREATER SAPHENOUS VEIN (N/A) TRANSESOPHAGEAL ECHOCARDIOGRAM (TEE) (N/A) INDOCYANINE GREEN FLUORESCENCE IMAGING (ICG) (N/A) ENDOVEIN HARVEST OF GREATER SAPHENOUS VEIN (Left) Mobilize Diuresis Plan for transfer to step-down: see transfer orders   LOS: 11 days    Jeremy Hickman 11/24/2020

## 2020-11-24 NOTE — Progress Notes (Addendum)
Physical Therapy Re-evaluation Patient Details Name: Jeremy Hickman MRN: [id number] DOB: [birth year] Today's Date: 11/24/2020    History of Present Illness Patient is a 70 y/o male who presents with RLE redness, swelling and weeping. Found to have RLE cellulitis, failed outpatient antibiotics. Found to have NSTEMI. Cardiac cath 11/17/20. CABG x 4 on 2/11.  PMH includes OSA on CPAP, HTN, DM-2, morbid obesity, anxiety and dementia.    PT Comments    Pt admitted with above diagnosis. Pt was able to ambulate with [place] walker with good distance overall. Took several standing rest breaks. Should progress well and be able to go home with wife. Wife can provide supervision only. Continue toward goals set on 2/6. Pt currently with functional limitations due to the deficits listed below (see PT Problem List). Pt will benefit from skilled PT to increase their independence and safety with mobility to allow discharge to the venue listed below.    Follow Up Recommendations  Home health PT;Supervision for mobility/OOB     Equipment Recommendations  Rolling walker with 5" wheels;Wheelchair Sports administrator wheelchair with anti tippers, desk armrests and elevating legrests);Wheelchair cushion (18x16 pressure relieving cushion)    Recommendations for Other Services OT consult     Precautions / Restrictions Precautions Precautions: Fall;Sternal Precaution Comments: wound VAC    Mobility  Bed Mobility Overal bed mobility: Needs Assistance Bed Mobility: Supine to Sit     Supine to sit: HOB elevated;Min assist     General bed mobility comments: min assist for safety with coming to EoB due to sternal precautions    Transfers Overall transfer level: Needs assistance Equipment used: Rolling walker (2 wheeled) Transfers: Sit to/from Stand Sit to Stand: Min guard;Min assist;From elevated surface         General transfer comment: min guard to min  for safety with power up from bed with cues for  hand placement on knees for sternal precautions.  Ambulation/Gait Ambulation/Gait assistance: Min guard;Min assist Gait Distance (Feet): 390 Feet Assistive device:  (Eva walker) Gait Pattern/deviations: Step-through pattern;Decreased stride length;Trunk flexed;Shuffle;Drifts right/left;Antalgic Gait velocity: decreased Gait velocity interpretation: <1.8 ft/sec, indicate of risk for recurrent falls General Gait Details: Slow, mildly unsteady gait with use of EVa walker with assist at times for balance. Several standing rest breaks and pt diaphoretic. VSS at beginning and end of walk.   Stairs             Wheelchair Mobility    Modified Rankin (Stroke Patients Only)       Balance Overall balance assessment: Needs assistance Sitting-balance support: Feet supported;No upper extremity supported Sitting balance-Leahy Scale: Good     Standing balance support: During functional activity Standing balance-Leahy Scale: Poor Standing balance comment: Relies on UEs for support                            Cognition Arousal/Alertness: Awake/alert Behavior During Therapy: WFL for tasks assessed/performed;Impulsive Overall Cognitive Status: History of cognitive impairments - at baseline Area of Impairment: Memory;Following commands;Awareness;Safety/judgement;Problem solving                     Memory: Decreased short-term memory Following Commands: Follows one step commands with increased time;Follows one step commands consistently Safety/Judgement: Decreased awareness of safety;Decreased awareness of deficits Awareness: Intellectual;Anticipatory Problem Solving: Requires verbal cues;Slow processing;Requires tactile cues General Comments: likely at baseline level, slightly impulsive due to eagerness to please, watch lines      Exercises  General Exercises - Lower Extremity Ankle Circles/Pumps: AROM;Both;10 reps;Supine Long Arc Quad: AROM;Both;10 reps;Seated     General Comments General comments (skin integrity, edema, etc.): Per pt request, brought O2 and placed pt on 2L during ambulation with DOE 2/4.  Pt sats >90% with O2 with activity. Pt did not need O2 at rest.      Pertinent Vitals/Pain Pain Assessment: Faces Faces Pain Scale: Hurts even more Pain Location: incision site Pain Descriptors / Indicators: Discomfort Pain Intervention(s): Limited activity within patient's tolerance;Monitored during session;Repositioned    Home Living Family/patient expects to be discharged to:: Private residence Living Arrangements: Spouse/significant other Available Help at Discharge:  (wife in wheelchair but independent, neighbor checks on pt) Type of Home: House Home Access: Level entry   Home Layout: One level Home Equipment: [place] - quad;Shower seat - built in;Toilet riser;Walker - 2 wheels;Wheelchair - manual Additional Comments: pt may want wheelchair    Prior Function Level of Independence: Independent      Comments: Independent for ADLs/IADLs. Drives. No falls in last 6 months. Reports wife is mainly in a w/c.   PT Goals (current goals can now be found in the care plan section) Acute Rehab PT Goals Patient Stated Goal: to go home soon Progress towards PT goals: Progressing toward goals    Frequency    Min 3X/week      PT Plan Current plan remains appropriate    Co-evaluation              AM-PAC PT "6 Clicks" Mobility   Outcome Measure  Help needed turning from your back to your side while in a flat bed without using bedrails?: None Help needed moving from lying on your back to sitting on the side of a flat bed without using bedrails?: A Little Help needed moving to and from a bed to a chair (including a wheelchair)?: A Little Help needed standing up from a chair using your arms (e.g., wheelchair or bedside chair)?: A Little Help needed to walk in hospital room?: A Little Help needed climbing 3-5 steps with a railing? : A  Lot 6 Click Score: 18    End of Session Equipment Utilized During Treatment: Gait belt;Oxygen Activity Tolerance: Patient tolerated treatment well Patient left: with call bell/phone within reach;in chair;with chair alarm set Nurse Communication: Mobility status PT Visit Diagnosis: Unsteadiness on feet (R26.81);Muscle weakness (generalized) (M62.81);Other abnormalities of gait and mobility (R26.89)     Time: 1638-4665 PT Time Calculation (min) (ACUTE ONLY): 29 min  Charges:  $Gait Training: 8-22 mins           $Re-evaluation 8-22 mins         Sorina Derrig W,PT Acute Rehabilitation Services Pager:  [phone]  Office:  [place] 11/24/2020, 2:15 PM

## 2020-11-24 NOTE — Progress Notes (Signed)
Mobility Specialist - Progress Note   11/24/20 1712  Mobility  Activity Ambulated in hall  Level of Assistance Minimal assist, patient does 75% or more  Assistive Device Front wheel walker  Distance Ambulated (ft) 100 ft  Mobility Response Tolerated fair  Mobility performed by Mobility specialist  $Mobility charge 1 Mobility   Pre-mobility: 72 HR, 90% SpO2 During mobility: 88 HR, 88-92% SpO2 Post-mobility: 78 HR, 144/72 BP, 90% SpO2  Pt ambulated on RA. At ~50 ft, pt began feeling "clammy" and had to have a seated rest break. SpO2 was 88%, he was able to rise >90% w/ pursed lip breathing. Pt then able to ambulate back to room. He was min assist to raise his legs when getting back into bed.   Pricilla Handler Mobility Specialist Mobility Specialist Phone: [phone]

## 2020-11-24 NOTE — Progress Notes (Signed)
Patient arrived to 42E08 from [facility]. Sternal wound vac in place, on and continuous 125 mmHg. Silver hydrofiber dressing c/d/i on right upper chest and SG incision c/d/i on L leg. VSS and pt denies pain at this time. Telemetry applied and CCMD notified. Patient oriented to room. Bed low and locked, and call bell left within reach. Will continue to monitor closely.   Gailen Shelter RN

## 2020-11-24 NOTE — Discharge Summary (Signed)
[address]       [place] [address]             [phone]    Physician Discharge Summary  Patient ID: Jeremy Hickman MRN: [id number] DOB/AGE: Jul 01, 1951 70 y.o.  Admit date: 11/12/2020 Discharge date: 11/27/2020  Admission Diagnoses:  Patient Active Problem List   Diagnosis Date Noted  . Acute on chronic combined systolic and diastolic CHF (congestive heart failure) ([place])   . S/P CABG x 5   . Class 2 obesity due to excess calories with body mass index (BMI) of 36.0 to 36.9 in adult 11/13/2020  . Cellulitis of right lower limb   . Cellulitis 11/12/2020  . Coronary artery disease involving native coronary artery of native heart without angina pectoris 11/01/2018  . SOB (shortness of breath) 11/01/2018  . Non-ST elevation (NSTEMI) myocardial infarction ([place]) 11/01/2018  . Gait difficulty 03/01/2018  . Sebaceous cyst 02/28/2018  . Bilateral lower extremity edema 02/06/2018  . Obesity (BMI 30.0-34.9) 02/06/2018  . Seasonal allergic rhinitis due to pollen 01/20/2016  . Cognitive complaints 11/06/2014  . Diabetic peripheral neuropathy ([place]) 11/06/2014  . Essential hypertension 12/03/2013  . Hyperlipidemia 12/03/2013  . Type 1 diabetes mellitus with hypoglycemia ([place]) 12/03/2013  . Osteoarthrosis 05/23/2013  . Insomnia 03/17/2013   Discharge Diagnoses:  Patient Active Problem List   Diagnosis Date Noted  . Acute on chronic combined systolic and diastolic CHF (congestive heart failure) ([place])   . S/P CABG x 5   . Class 2 obesity due to excess calories with body mass index (BMI) of 36.0 to 36.9 in adult 11/13/2020  . Cellulitis of right lower limb   . Cellulitis 11/12/2020  . Coronary artery disease involving native coronary artery of native heart without angina pectoris 11/01/2018  . SOB (shortness of breath) 11/01/2018  . Non-ST elevation (NSTEMI) myocardial infarction ([place]) 11/01/2018  . Gait difficulty 03/01/2018  . Sebaceous cyst 02/28/2018  .  Bilateral lower extremity edema 02/06/2018  . Obesity (BMI 30.0-34.9) 02/06/2018  . Seasonal allergic rhinitis due to pollen 01/20/2016  . Cognitive complaints 11/06/2014  . Diabetic peripheral neuropathy ([place]) 11/06/2014  . Essential hypertension 12/03/2013  . Hyperlipidemia 12/03/2013  . Type 1 diabetes mellitus with hypoglycemia ([place]) 12/03/2013  . Osteoarthrosis 05/23/2013  . Insomnia 03/17/2013   Discharged Condition: good  History of Present Illness:  [name] is a 70 year old male with past medical history significant for type 1 diabetes mellitus, hypertension, obesity, dyslipidemia, [name], [name], in [facility].  He developed pain, redness, and swelling in his right lower extremity and sought medical care in the emergency room at [facility] on 11/10/2020.  He underwent lower extremity ultrasound that ruled out DVT.  He was treated empirically with oral antibiotics but did not see any improvement.  For this reason, he presented to med [facility] with the same complaint.  Since he was felt to have failed outpatient management, decision was made to admit [name] to the hospital for IV antibiotics for cellulitis.  He was admitted on 11/13/2020.  Blood cultures were obtained that were negative.  The antibiotics were initiated.  On the following day, he developed wheezing and shortness of breath.  The primary care team initiated a work-up that  included a BN P elevated 859 and high-sensitivity troponin elevated in excess of 6600.  The patient was started on heparin drip for suspected acute coronary syndrome.  An echocardiogram was obtained that showed an ejection fraction of 40 to 45% with regional wall motion abnormalities.  Please see the full note below.  The cardiology  service was consulted.  Acute non-ST elevation myocardial infarction was confirmed.  Repeat left heart catheterization was recommended.  Patient was taken to the Cath Lab yesterday where left heart catheterization demonstrated severe three-vessel coronary artery disease.  Please see the full report below. CT surgery has been consulted for consideration of surgical revascularization. Jeremy Hickman has diagnosis of early dementia.  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, [name],000 at the time of admission to 14,000 today.  He is afebrile and blood cultures obtained at the time of admission have remained negative.  He was evaluated by Dr. Orvan Seen who felt surgical intervention would be the best treatment option.  The risks and benefits of the procedure were explained to the patient and he was agreeable to proceed.  Hospital course:  The patient remained on ABX for his RLE cellulitis.  He was taken to the operating room on 11/21/2020.  He underwent CABG x 5 utilizing LIMA to LAD, SVG to Diagonal, Sequential SVG to PDA and PL, and Free RIMA to OM.  He also underwent endoscopic harvest of greater saphenous vein from his left leg.  He tolerated the procedure without difficulty and was taken to the SICU in stable condition.  The patient was extubated the evening of surgery.  During his stay in the SICU the patient was weaned off Neo-synephrine as tolerated.  His chest tubes and arterial lines were removed without difficulty.  He was started on diuretics for mild volume overload state.  The patient's insulin regimen was adjusted for better glucose control.  The patient was maintaining NSR and was felt stable for transfer to the progressive care unit on 11/24/2020.  He completed his course  of ABX for his RLE cellulitis he had preoperatively.    The patient was mildly hypertensive.  He was taken off Lopressor and placed on his home regimen of Coreg.  He was in NSR and his pacing wires were removed without difficulty.  The patient remained significantly volume overloaded.  He was treated with a dose of Metolazone in addition to his IV Lasix.  He responded well to diuresis.  TED hose were also placed to help with LE edema.  His blood sugars have remained elevated during his stay and his insulin regimens were titrated accordingly.  The patient continues to work with PT/OT who have recommended home therapy.  This has been arranged.  His surgical incisions are healing without evidence of infection.  He is ambulating without significant difficulty.  His RLE cellulitis is resolved.  His surgical incisions are healing without evidence of infection.  He is medically stable for discharge home today.  Consults: None  Significant Diagnostic Studies: angiography:    Prox RCA to Mid RCA lesion is 90% stenosed.  Prox RCA lesion is 80% stenosed.  Prox LAD lesion is 95% stenosed.  Mid Cx lesion is 95% stenosed.  Prox Cx lesion is 60% stenosed with 60% stenosed side branch in 1st  Mrg.  Mid RCA to Dist RCA lesion is 100% stenosed.  Mid LAD lesion is 80% stenosed.  Mid LAD to Dist LAD lesion is 80% stenosed.   Severe multivessel CAD with evidence for coronary calcification.     Treatments: surgery:   PROCEDURE:  Procedure(s):  CORONARY ARTERY BYPASS GRAFTING x 5 -LIMA to LAD -SVG to DIAGONAL -SEQ SVG to PDA, PLB -FREE RIMA to OM  ENDOSCOPIC HARVEST GREATER SAPHENOUS VEIN -Left Thigh  TRANSESOPHAGEAL ECHOCARDIOGRAM (TEE) (N/A)  INDOCYANINE GREEN FLUORESCENCE IMAGING (ICG) (N/A)   Discharge Exam: Blood pressure 124/66, pulse 91, temperature 98.1 F (36.7 C), temperature source Oral, resp. rate 16, height 5' 8"  (1.727 m), weight 101.3 kg, SpO2 92 %.  General appearance:  alert, cooperative and no distress Heart: regular rate and rhythm Lungs: clear to auscultation bilaterally Abdomen: soft, non-tender; bowel sounds normal; no masses,  no organomegaly Extremities: edema pitting improved down to 1+ Wound: clean and dry, staples remain in place  Discharge Medications:  The patient has been discharged on:   1.Beta Blocker:  Yes [ x  ]                              No   [   ]                              If No, reason:  2.Ace Inhibitor/ARB: Yes [ x  ]                                     No  [    ]                                     If No, reason:  3.Statin:   Yes [ x  ]                  No  [   ]                  If No, reason:  4.Ecasa:  Yes  [ x  ]                  No   [   ]                  If No, reason:   Allergies as of 11/27/2020      Reactions   Iodinated Diagnostic Agents Hives      Medication List    TAKE these medications   acetaminophen 500 MG tablet Commonly known as: TYLENOL Take 2 tablets (1,000 mg total) by mouth every 6 (six) hours as needed for mild pain, fever or headache.   aspirin 325 MG EC tablet Take 1 tablet (325 mg total) by mouth daily. What changed:   medication strength  how much to take  when to take this   atorvastatin 80 MG tablet Commonly known as: LIPITOR Take 1 tablet (80 mg total) by mouth daily.   busPIRone 15 MG tablet Commonly known as: BUSPAR Take 15-30 mg by mouth See admin instructions. Taking 30 mg in the AM and 15 mg in the evening.   carvedilol 6.25 MG tablet Commonly known  as: COREG Take 6.25 mg by mouth 2 (two) times daily with a meal.   cetirizine 10 MG tablet Commonly known as: ZYRTEC Take 10 mg by mouth daily.   diphenhydrAMINE 25 mg capsule Commonly known as: BENADRYL Take 25 mg by mouth every 6 (six) hours as needed for allergies.   donepezil 5 MG tablet Commonly known as: ARICEPT Take 5 mg by mouth at bedtime.   ezetimibe 10 MG tablet Commonly known as:  ZETIA Take 10 mg by mouth daily.   fenofibrate micronized 130 MG capsule Commonly known as: ANTARA Take 1 tablet daily   Fifty50 Glucose Meter 2.0 w/Device Kit Use as instructed   Flax Oil-Fish Oil-Borage Oil Caps Take 1,000 capsules by mouth daily.   FLUoxetine 40 MG capsule Commonly known as: PROZAC Take 40 mg by mouth daily.   furosemide 20 MG tablet Commonly known as: LASIX Take 20-40 mg by mouth See admin instructions. Takes 74m in the morning and 24min the afternoon   guaiFENesin 600 MG 12 hr tablet Commonly known as: MUCINEX Take 2 tablets (1,200 mg total) by mouth 2 (two) times daily as needed.   hydrocerin Crea Apply 1 application topically daily. To lower extremities as needed   insulin glargine 100 UNIT/ML injection Commonly known as: LANTUS Inject 42 Units into the skin at bedtime.   ketoconazole 2 % shampoo Commonly known as: NIZORAL Apply 1 application topically 2 (two) times a week.   Melatonin 3 MG Caps Take 3 mg by mouth at bedtime.   Multi-Vitamins Tabs Take 1 tablet by mouth daily.   NovoLOG FlexPen ReliOn 100 UNIT/ML FlexPen Generic drug: insulin aspart Inject 10-25 Units into the skin 3 (three) times daily before meals.   oxyCODONE 5 MG immediate release tablet Commonly known as: Oxy IR/ROXICODONE Take 1-2 tablets (5-10 mg total) by mouth every 4 (four) hours as needed for severe pain.   polyethylene glycol 17 g packet Commonly known as: MIRALAX / GLYCOLAX Take 17 g by mouth daily as needed for mild constipation.   potassium chloride SA 20 MEQ tablet Commonly known as: KLOR-CON Take 2 tablets (40 mEq total) by mouth daily.   telmisartan 80 MG tablet Commonly known as: MICARDIS Take 80 mg by mouth daily.   temazepam 15 MG capsule Commonly known as: RESTORIL Take 15 mg by mouth at bedtime as needed for sleep.   terazosin 5 MG capsule Commonly known as: HYTRIN Take 5 mg by mouth at bedtime.   traZODone 50 MG tablet Commonly  known as: DESYREL Take 50 mg by mouth at bedtime.       Follow-up Information    [facility] Follow up on 12/15/2020.   Specialty: Cardiothoracic Surgery Why: Appointment is at 12:30 Contact information: [id number] [place] up on 12/16/2020.   Specialty: Cardiology Why: Appointment is at 9:15 Contact information: [address] [address] [phone]             Signed:  ErEllwood HandlerPA-C 11/27/2020, 8:18 AM

## 2020-11-25 ENCOUNTER — Inpatient Hospital Stay (HOSPITAL_COMMUNITY): Payer: HMO

## 2020-11-25 LAB — CBC
HCT: 26.7 % — ABNORMAL LOW (ref 39.0–52.0)
Hemoglobin: 8.6 g/dL — ABNORMAL LOW (ref 13.0–17.0)
MCH: 31.7 pg (ref 26.0–34.0)
MCHC: 32.2 g/dL (ref 30.0–36.0)
MCV: 98.5 fL (ref 80.0–100.0)
Platelets: 332 10*3/uL (ref 150–400)
RBC: 2.71 MIL/uL — ABNORMAL LOW (ref 4.22–5.81)
RDW: 13.9 % (ref 11.5–15.5)
WBC: 14.3 10*3/uL — ABNORMAL HIGH (ref 4.0–10.5)
nRBC: 0 % (ref 0.0–0.2)

## 2020-11-25 LAB — GLUCOSE, CAPILLARY
Glucose-Capillary: 242 mg/dL — ABNORMAL HIGH (ref 70–99)
Glucose-Capillary: 280 mg/dL — ABNORMAL HIGH (ref 70–99)
Glucose-Capillary: 202 mg/dL — ABNORMAL HIGH (ref 70–99)
Glucose-Capillary: 228 mg/dL — ABNORMAL HIGH (ref 70–99)

## 2020-11-25 LAB — POCT ACTIVATED CLOTTING TIME: Activated Clotting Time: 124 seconds

## 2020-11-25 LAB — BASIC METABOLIC PANEL
Anion gap: 9 (ref 5–15)
BUN: 40 mg/dL — ABNORMAL HIGH (ref 8–23)
CO2: 28 mmol/L (ref 22–32)
Calcium: 8.4 mg/dL — ABNORMAL LOW (ref 8.9–10.3)
Chloride: 97 mmol/L — ABNORMAL LOW (ref 98–111)
Creatinine, Ser: 1.09 mg/dL (ref 0.61–1.24)
GFR, Estimated: >60 mL/min (ref >60)
Glucose, Bld: 164 mg/dL — ABNORMAL HIGH (ref 70–99)
Potassium: 3.8 mmol/L (ref 3.5–5.1)
Sodium: 134 mmol/L — ABNORMAL LOW (ref 135–145)

## 2020-11-25 MED ORDER — METOLAZONE 5 MG PO TABS
5.0000 mg | ORAL_TABLET | Freq: Once | ORAL | Status: AC
  Administered 2020-11-25: 5 mg via ORAL
  Filled 2020-11-25: qty 1

## 2020-11-25 MED ORDER — POTASSIUM CHLORIDE CRYS ER 20 MEQ PO TBCR
40.0000 meq | EXTENDED_RELEASE_TABLET | Freq: Two times a day (BID) | ORAL | Status: DC
  Administered 2020-11-25 (×2): 40 meq via ORAL
  Filled 2020-11-25 (×2): qty 2

## 2020-11-25 MED ORDER — INSULIN GLARGINE 100 UNIT/ML ~~LOC~~ SOLN
32.0000 [IU] | Freq: Every day | SUBCUTANEOUS | Status: DC
  Administered 2020-11-25 – 2020-11-27 (×3): 32 [IU] via SUBCUTANEOUS
  Filled 2020-11-25 (×3): qty 0.32

## 2020-11-25 MED ORDER — ASPIRIN 325 MG PO TBEC: 325.0000 mg | DELAYED_RELEASE_TABLET | Freq: Every day | ORAL | 0 refills | Status: AC

## 2020-11-25 MED ORDER — GUAIFENESIN ER 600 MG PO TB12: 1200.0000 mg | ORAL_TABLET | Freq: Two times a day (BID) | ORAL | Status: AC | PRN

## 2020-11-25 MED ORDER — HYDROCERIN EX CREA: 1.0000 "application " | TOPICAL_CREAM | Freq: Every day | CUTANEOUS | 0 refills | Status: AC

## 2020-11-25 MED ORDER — ACETAMINOPHEN 500 MG PO TABS: 1000.0000 mg | ORAL_TABLET | Freq: Four times a day (QID) | ORAL | 0 refills | Status: AC | PRN

## 2020-11-25 MED ORDER — GUAIFENESIN ER 600 MG PO TB12
1200.0000 mg | ORAL_TABLET | Freq: Two times a day (BID) | ORAL | Status: DC
  Administered 2020-11-25 – 2020-11-27 (×5): 1200 mg via ORAL
  Filled 2020-11-25 (×5): qty 2

## 2020-11-25 MED ORDER — POLYETHYLENE GLYCOL 3350 17 G PO PACK: 17.0000 g | PACK | Freq: Every day | ORAL | 0 refills | Status: AC | PRN

## 2020-11-25 MED ORDER — CARVEDILOL 6.25 MG PO TABS
6.2500 mg | ORAL_TABLET | Freq: Two times a day (BID) | ORAL | Status: DC
  Administered 2020-11-25 – 2020-11-27 (×5): 6.25 mg via ORAL
  Filled 2020-11-25 (×5): qty 1

## 2020-11-25 MED FILL — Electrolyte-R (PH 7.4) Solution: INTRAVENOUS | Qty: 5000 | Status: AC

## 2020-11-25 MED FILL — Sodium Chloride IV Soln 0.9%: INTRAVENOUS | Qty: 2000 | Status: AC

## 2020-11-25 MED FILL — Calcium Chloride Inj 10%: INTRAVENOUS | Qty: 10 | Status: AC

## 2020-11-25 MED FILL — Heparin Sodium (Porcine) Inj 1000 Unit/ML: INTRAMUSCULAR | Qty: 30 | Status: AC

## 2020-11-25 MED FILL — Sodium Bicarbonate IV Soln 8.4%: INTRAVENOUS | Qty: 50 | Status: AC

## 2020-11-25 MED FILL — Mannitol IV Soln 20%: INTRAVENOUS | Qty: 1000 | Status: AC

## 2020-11-25 MED FILL — Lidocaine HCl Local Soln Prefilled Syringe 100 MG/5ML (2%): INTRAMUSCULAR | Qty: 5 | Status: AC

## 2020-11-25 MED FILL — Thrombin For Soln 5000 Unit: CUTANEOUS | Qty: 5000 | Status: AC

## 2020-11-25 NOTE — Progress Notes (Signed)
Mobility Specialist - Progress Note   11/25/20 1638  Mobility  Activity Ambulated in hall  Level of Assistance Minimal assist, patient does 75% or more  Assistive Device Front wheel walker  Distance Ambulated (ft) 140 ft  Mobility Response Tolerated fair  Mobility performed by Mobility specialist  $Mobility charge 1 Mobility   Pre-mobility: 79 HR During mobility: 91 HR Post-mobility: 85 HR  Pt min assist to rock-to-stand from chair. Pt standby assist during ambulation. He required one standing rest break due to feeling fatigued. Pt back to recliner after walk.   Pricilla Handler Mobility Specialist Mobility Specialist Phone: [phone]

## 2020-11-25 NOTE — Progress Notes (Signed)
CARDIAC REHAB PHASE I   PRE:  Rate/Rhythm: 79 SR  BP:  Supine: 127/70  Sitting:   Standing:    SaO2: 92%RA  MODE:  Ambulation: 390 ft   POST:  Rate/Rhythm: 95 SR  BP:  Supine:   Sitting: 145/67  Standing:    SaO2: 90%RA 0945-1045 Pt walked 390 ft on RA with gait belt use, rollator, and asst x 2. Pt sat twice to rest but he pushed forward to go farther. Lungs sound junky. Gave heart pillow and encouraged pt to cough when sitting in hallway. Encouraged flutter valve and IS when in room. To recliner with call bell. Encouraged walk later with Mobility team.   Graylon Good, RN BSN  11/25/2020 10:39 AM

## 2020-11-25 NOTE — Progress Notes (Signed)
Occupational Therapy Treatment Patient Details Name: Jeremy Hickman MRN: [id number] DOB: 1950/11/05 Today's Date: 11/25/2020    History of present illness Patient is a 70 y/o male who presents with RLE redness, swelling and weeping. Found to have RLE cellulitis, failed outpatient antibiotics. Found to have NSTEMI. Cardiac cath 11/17/20. CABG x 4 on 2/11.  PMH includes OSA on CPAP, HTN, DM-2, morbid obesity, anxiety and dementia.   OT comments  Pt progressing towards established OT goals. Pt performing bed mobility with Mod A. Educating pt on AE for LB ADLs and maintaining sternal precautions. Pt completing LB dressing task with AE and Min A. Pt requiring Min guard A for functional mobility with RW. VSS throughout. Continue to recommend dc to home with HHOT. Will continue to follow acutely as admitted.    Follow Up Recommendations  Home health OT;Supervision/Assistance - 24 hour    Equipment Recommendations  None recommended by OT    Recommendations for Other Services      Precautions / Restrictions Precautions Precautions: Fall;Sternal Precaution Booklet Issued: No Precaution Comments: Sternal wound VAC. Reviewed sternal precautions       Mobility Bed Mobility Overal bed mobility: Needs Assistance Bed Mobility: Rolling;Sidelying to Sit Rolling: Min assist Sidelying to sit: Mod assist       General bed mobility comments: Min A for rolling to left and then Mod A to elevate trunk  Transfers Overall transfer level: Needs assistance Equipment used: Rolling walker (2 wheeled) Transfers: Sit to/from Stand Sit to Stand: Min guard         General transfer comment: Min Guard A for safety and cues for hand placement    Balance Overall balance assessment: Needs assistance Sitting-balance support: No upper extremity supported;Feet supported Sitting balance-Leahy Scale: Good     Standing balance support: No upper extremity supported;During functional activity Standing  balance-Leahy Scale: Fair Standing balance comment: Static standing without UE support                           ADL either performed or assessed with clinical judgement   ADL Overall ADL's : Needs assistance/impaired                     Lower Body Dressing: Minimal assistance;With adaptive equipment;Sit to/from stand Lower Body Dressing Details (indicate cue type and reason): Educating pt on use of AE for LB dressing. Pt donning socks with sock aide and doffing socks with reacher. Providing simulated demosntrated of donning pants with reacher; pt would benefit from practice. Toilet Transfer: Min guard;Ambulation;RW (simulated to recliner) Toilet Transfer Details (indicate cue type and reason): Requiring cues for hand placemebnt and safety         Functional mobility during ADLs: Min guard;Rolling walker General ADL Comments: Pt presenting with decreased activity tolerance and balance. providing education on LB dressing with AE to adhere to sternal precautions.     Vision       Perception     Praxis      Cognition Arousal/Alertness: Awake/alert Behavior During Therapy: Flat affect Overall Cognitive Status: History of cognitive impairments - at baseline Area of Impairment: Memory;Following commands;Awareness;Safety/judgement;Problem solving                     Memory: Decreased short-term memory;Decreased recall of precautions Following Commands: Follows one step commands with increased time;Follows multi-step commands inconsistently;Follows multi-step commands with increased time Safety/Judgement: Decreased awareness of safety;Decreased awareness of deficits Awareness:  Intellectual;Emergent Problem Solving: Slow processing;Requires verbal cues General Comments: Baseline dementia. Pt with decreased problem solving and awareness requiring cues and increased time. Moments of explatives or inappropiate comments; easily redirected. Following simple  commands        Exercises     Shoulder Instructions       General Comments VSS throughout on RA.    Pertinent Vitals/ Pain       Pain Assessment: Faces Faces Pain Scale: Hurts little more Pain Location: sternum Pain Descriptors / Indicators: Discomfort Pain Intervention(s): Monitored during session;Limited activity within patient's tolerance;Repositioned  Home Living                                          Prior Functioning/Environment              Frequency  Min 2X/week        Progress Toward Goals  OT Goals(current goals can now be found in the care plan section)  Progress towards OT goals: Progressing toward goals  Acute Rehab OT Goals Patient Stated Goal: to go home soon OT Goal Formulation: With patient Time For Goal Achievement: 12/01/20 Potential to Achieve Goals: Good ADL Goals Pt Will Perform Grooming: with supervision;standing Pt Will Perform Lower Body Bathing: with supervision;sit to/from stand Pt Will Perform Lower Body Dressing: with supervision;sit to/from stand Additional ADL Goal #1: PT wil walk to bathroom with walker and toilet with 3:1 over commode with supervision.  Plan Discharge plan remains appropriate    Co-evaluation                 AM-PAC OT "6 Clicks" Daily Activity     Outcome Measure   Help from another person eating meals?: None Help from another person taking care of personal grooming?: None Help from another person toileting, which includes using toliet, bedpan, or urinal?: A Little Help from another person bathing (including washing, rinsing, drying)?: A Little Help from another person to put on and taking off regular upper body clothing?: None Help from another person to put on and taking off regular lower body clothing?: A Little 6 Click Score: 21    End of Session Equipment Utilized During Treatment: Rolling walker;Gait belt  OT Visit Diagnosis: Unsteadiness on feet (R26.81);Muscle  weakness (generalized) (M62.81);Other symptoms and signs involving cognitive function Pain - Right/Left: Right Pain - part of body: Leg   Activity Tolerance Patient limited by pain   Patient Left in chair;with call bell/phone within reach   Nurse Communication Mobility status        Time: 2778-2423 OT Time Calculation (min): 35 min  Charges: OT General Charges $OT Visit: 1 Visit OT Treatments $Self Care/Home Management : 8-22 mins $Therapeutic Activity: 8-22 mins  Neil Brickell MSOT, OTR/L Acute Rehab Pager: [phone] Office: [place] 11/25/2020, 3:50 PM

## 2020-11-25 NOTE — Progress Notes (Signed)
      [address]       [place] [address]             [phone]      4 Days Post-Op Procedure(s) (LRB): CORONARY ARTERY BYPASS GRAFTING (CABG) TIMES FOUR ON PUMP USING BILATERAL INTERNAL MAMMARY ARTERIES AND ENDOSCOPICALLY HARVESTED LEFT GREATER SAPHENOUS VEIN (N/A) TRANSESOPHAGEAL ECHOCARDIOGRAM (TEE) (N/A) INDOCYANINE GREEN FLUORESCENCE IMAGING (ICG) (N/A) ENDOVEIN HARVEST OF GREATER SAPHENOUS VEIN (Left)   Subjective:  States he has been through a lot.  Having difficulty coughing up sputum.  + BM  Objective: Vital signs in last 24 hours: Temp:  [97.7 F (36.5 C)-98.5 F (36.9 C)] 98.5 F (36.9 C) (02/15 0520) Pulse Rate:  [72-90] 76 (02/15 0520) Cardiac Rhythm: Normal sinus rhythm;Bundle branch block (02/15 0550) Resp:  [16-22] 17 (02/15 0520) BP: (112-165)/(56-74) 120/64 (02/15 0520) SpO2:  [91 %-98 %] 94 % (02/15 0520) Weight:  [106.4 kg] 106.4 kg (02/15 0500)  Intake/Output from previous day: 02/14 0701 - 02/15 0700 In: 860 [P.O.:860] Out: 2900 [Urine:2900]  General appearance: alert, cooperative and no distress Heart: regular rate and rhythm Lungs: diminished breath sounds bibasilar Abdomen: soft, non-tender; bowel sounds normal; no masses,  no organomegaly Extremities: edema 2-3+ Wound: clean and dry  Lab Results: Recent Labs    11/24/20 0731 11/25/20 0046  WBC 13.5* 14.3*  HGB 8.8* 8.6*  HCT 26.3* 26.7*  PLT 266 332   BMET:  Recent Labs    11/24/20 0731 11/25/20 0046  NA 134* 134*  K 4.4 3.8  CL 99 97*  CO2 28 28  GLUCOSE 179* 164*  BUN 35* 40*  CREATININE 1.04 1.09  CALCIUM 8.4* 8.4*    PT/INR: No results for input(s): LABPROT, INR in the last 72 hours. ABG    Component Value Date/Time   PHART 7.418 11/21/2020 2003   HCO3 26.7 11/21/2020 2003   TCO2 28 11/21/2020 2003   O2SAT 97.0 11/21/2020 2003   CBG (last 3)  Recent Labs    11/24/20 1651 11/24/20 2112 11/25/20 0629  GLUCAP 214* 234* 242*     Assessment/Plan: S/P Procedure(s) (LRB): CORONARY ARTERY BYPASS GRAFTING (CABG) TIMES FOUR ON PUMP USING BILATERAL INTERNAL MAMMARY ARTERIES AND ENDOSCOPICALLY HARVESTED LEFT GREATER SAPHENOUS VEIN (N/A) TRANSESOPHAGEAL ECHOCARDIOGRAM (TEE) (N/A) INDOCYANINE GREEN FLUORESCENCE IMAGING (ICG) (N/A) ENDOVEIN HARVEST OF GREATER SAPHENOUS VEIN (Left)  1. CV- NSR, BP elevated- will change Lopressor to home dose of Coreg at 6.25 mg BID, watch BP if no improvement will restart home ARB 2. Pulm- CXR with atelectasis, weaning oxygen as tolerated, good use of flutter valve, will add Mucinex to aid with breaking up secretions 3. Renal- creatinine stable, if accurate weight is below admission, significant edema in LE, will continue IV diuretics today, give 1 dose of Metolazone, supplement K 4. Expected post operative blood loss anemia, mild HGb at 8.6, monitor 5. DM- sugars are elevated, will increase Lantus to 32 units nightly,  He takes 42 at home 6. Decondtioning- will place orders for home health PT, per patient he has a walker and wheelchair at home 7. Dispo- patient stable, d/c EPW today, titrate BP medications as needed, diurese, adjust insulin regimen   LOS: 12 days    Ellwood Handler, PA-C 11/25/2020

## 2020-11-25 NOTE — Care Management Important Message (Signed)
Important Message  Patient Details  Name: Jeremy Hickman MRN: [id number] Date of Birth: 01-02-1951   Medicare Important Message Given:  Yes     Shelda Altes 11/25/2020, 11:24 AM

## 2020-11-25 NOTE — TOC Initial Note (Signed)
Transition of Care (TOC) - Initial/Assessment Note  Marvetta Gibbons RN, BSN Transitions of Care Unit 4E- RN Case Manager See Treatment Team for direct phone #    Patient Details  Name: Jeremy Hickman MRN: [id number] Date of Birth: [birth year]  Transition of Care [facility]) CM/SW Contact:    Dawayne Patricia, RN Phone Number: 11/25/2020, 4:41 PM  Clinical Narrative:                 Pt s/p CABG with sternal prevena wound VAC placed. From home with wife- whom the pt assist as caregiver. Per conversation with pt wife has all needed DME that pt can use as well if needed. Discussed with pt he may want his own RW however pt states he really wants a w/c that he will plan to purchase himself as he would prefer to have a smaller one like his wife's and is worried that insurance will likely not approve one that size for him.   Discussed with pt HHPT order and recommendation- pt states his home is small and that he feels like he can mobilize on his own when he returns home. List provided for choice Per CMS guidelines from medicare.gov website with star ratings (copy placed in shadow chart)- at this time pt is declining [facility] referral- as he does not feel like he will need it at discharge, explained to pt that should he change his mind it can still be arranged just to let staff know.   TOC to follow for any further transition needs.   Expected Discharge Plan: [place] Barriers to Discharge: Continued Medical Work up   Patient Goals and CMS Choice Patient states their goals for this hospitalization and ongoing recovery are:: return home with wife CMS Medicare.gov Compare Post Acute Care list provided to:: Patient Choice offered to / list presented to : Patient  Expected Discharge Plan and Services Expected Discharge Plan: [place]   Discharge Planning Services: CM Consult Post Acute Care Choice: [place] arrangements for the past 2 months: Single  Family Home                 DME Arranged: N/A DME Agency: NA       HH Arranged: PT,Patient Refused HH          Prior Living Arrangements/Services Living arrangements for the past 2 months: Single Family Home Lives with:: Spouse Patient language and need for interpreter reviewed:: Yes Do you feel safe going back to the place where you live?: Yes      Need for Family Participation in Patient Care: Yes (Comment) Care giver support system in place?: Yes (comment) Current home services: DME Criminal Activity/Legal Involvement Pertinent to Current Situation/Hospitalization: No - Comment as needed  Activities of Daily Living Home Assistive Devices/Equipment: Gilford Rile (specify type) ADL Screening (condition at time of admission) Patient's cognitive ability adequate to safely complete daily activities?: Yes Is the patient deaf or have difficulty hearing?: No Does the patient have difficulty seeing, even when wearing glasses/contacts?: No Does the patient have difficulty concentrating, remembering, or making decisions?: No Patient able to express need for assistance with ADLs?: Yes Does the patient have difficulty dressing or bathing?: No Independently performs ADLs?: Yes (appropriate for developmental age) Does the patient have difficulty walking or climbing stairs?: Yes Weakness of Legs: Right Weakness of Arms/Hands: None  Permission Sought/Granted  Emotional Assessment Appearance:: Appears stated age Attitude/Demeanor/Rapport: Engaged Affect (typically observed): Appropriate Orientation: : Oriented to Self,Oriented to Place,Oriented to  Time,Oriented to Situation Alcohol / Substance Use: Not Applicable Psych Involvement: No (comment)  Admission diagnosis:  Cellulitis [L03.90] Leg swelling [M79.89] Cellulitis of right lower extremity [H85.277] Patient Active Problem List   Diagnosis Date Noted  . Acute on chronic combined systolic and diastolic CHF  (congestive heart failure) ([place])   . S/P CABG (coronary artery bypass graft)   . Class 2 obesity due to excess calories with body mass index (BMI) of 36.0 to 36.9 in adult 11/13/2020  . Cellulitis of right lower limb   . Cellulitis 11/12/2020  . Coronary artery disease involving native coronary artery of native heart without angina pectoris 11/01/2018  . SOB (shortness of breath) 11/01/2018  . Non-ST elevation (NSTEMI) myocardial infarction ([place]) 11/01/2018  . Gait difficulty 03/01/2018  . Sebaceous cyst 02/28/2018  . Bilateral lower extremity edema 02/06/2018  . Obesity (BMI 30.0-34.9) 02/06/2018  . Seasonal allergic rhinitis due to pollen 01/20/2016  . Cognitive complaints 11/06/2014  . Diabetic peripheral neuropathy ([place]) 11/06/2014  . Essential hypertension 12/03/2013  . Hyperlipidemia 12/03/2013  . Type 1 diabetes mellitus with hypoglycemia ([place]) 12/03/2013  . Osteoarthrosis 05/23/2013  . Insomnia 03/17/2013   PCP:  Drosinis, [name], PA-C Pharmacy:   [facility], [address] [place] [address] Phone: [phone] Fax: [phone]     Social Determinants of Health (SDOH) Interventions    Readmission Risk Interventions No flowsheet data found.

## 2020-11-25 NOTE — Progress Notes (Signed)
OT Cancellation Note  Patient Details Name: Jeremy Hickman MRN: [id number] DOB: [birth year]   Cancelled Treatment:    Reason Eval/Treat Not Completed: Active bedrest order;Fatigue/lethargy limiting ability to participate. BR till ~1400. Per RN pt very fatigued and tired as he didn't sleep much last night. Will return as schedule allows. Thank you.  [place], OTR/L Acute Rehab Pager: [phone] Office: [phone] 11/25/2020, 1:32 PM

## 2020-11-25 NOTE — Progress Notes (Signed)
   Chart updates reviewed - CVTS managing diuretics, lytes and BP today. Telemetry reviewed showing NSR with rare PVC. CXR result reviewed with some enlargement of cardiac silhouette - reviewed with MD, likely expected sequelae of recent bypass. Patient is hemodynamically stable with normal VS otherwise. Please call if we can be of any additional help today. We'll continue to follow along. [name] PA-C

## 2020-11-25 NOTE — Progress Notes (Signed)
EPW discontinued without difficulty. Wires intact upon removal. Patient tolerated well. Wire insertion site intact & w/o drainage.  Vital sign protocol initiated Q 15.  Bed rest x 1 hour.

## 2020-11-26 LAB — BASIC METABOLIC PANEL
Anion gap: 10 (ref 5–15)
BUN: 37 mg/dL — ABNORMAL HIGH (ref 8–23)
CO2: 27 mmol/L (ref 22–32)
Calcium: 8.6 mg/dL — ABNORMAL LOW (ref 8.9–10.3)
Chloride: 99 mmol/L (ref 98–111)
Creatinine, Ser: 0.94 mg/dL (ref 0.61–1.24)
GFR, Estimated: >60 mL/min (ref >60)
Glucose, Bld: 140 mg/dL — ABNORMAL HIGH (ref 70–99)
Potassium: 4.5 mmol/L (ref 3.5–5.1)
Sodium: 136 mmol/L (ref 135–145)

## 2020-11-26 LAB — GLUCOSE, CAPILLARY
Glucose-Capillary: 126 mg/dL — ABNORMAL HIGH (ref 70–99)
Glucose-Capillary: 272 mg/dL — ABNORMAL HIGH (ref 70–99)
Glucose-Capillary: 266 mg/dL — ABNORMAL HIGH (ref 70–99)
Glucose-Capillary: 289 mg/dL — ABNORMAL HIGH (ref 70–99)

## 2020-11-26 MED ORDER — POTASSIUM CHLORIDE CRYS ER 20 MEQ PO TBCR
40.0000 meq | EXTENDED_RELEASE_TABLET | Freq: Every day | ORAL | Status: DC
  Administered 2020-11-26 – 2020-11-27 (×2): 40 meq via ORAL
  Filled 2020-11-26 (×2): qty 2

## 2020-11-26 MED ORDER — METOLAZONE 5 MG PO TABS
5.0000 mg | ORAL_TABLET | Freq: Once | ORAL | Status: AC
  Administered 2020-11-26: 5 mg via ORAL
  Filled 2020-11-26: qty 1

## 2020-11-26 NOTE — Progress Notes (Signed)
      [address]       [place], [address]             [phone]      5 Days Post-Op Procedure(s) (LRB): CORONARY ARTERY BYPASS GRAFTING (CABG) TIMES FOUR ON PUMP USING BILATERAL INTERNAL MAMMARY ARTERIES AND ENDOSCOPICALLY HARVESTED LEFT GREATER SAPHENOUS VEIN (N/A) TRANSESOPHAGEAL ECHOCARDIOGRAM (TEE) (N/A) INDOCYANINE GREEN FLUORESCENCE IMAGING (ICG) (N/A) ENDOVEIN HARVEST OF GREATER SAPHENOUS VEIN (Left)   Subjective:  No new complaints.  He is little sleepy this morning.  Asking when he can go home.  Objective: Vital signs in last 24 hours: Temp:  [97.6 F (36.4 C)-98.9 F (37.2 C)] 98.9 F (37.2 C) (02/16 0733) Pulse Rate:  [77-88] 88 (02/16 0733) Cardiac Rhythm: Normal sinus rhythm (02/16 0300) Resp:  [10-20] 18 (02/16 0733) BP: (124-142)/(67-80) 141/70 (02/16 0733) SpO2:  [94 %-96 %] 94 % (02/16 0733) Weight:  [104.1 kg] 104.1 kg (02/16 0500)  Intake/Output from previous day: 02/15 0701 - 02/16 0700 In: 480 [P.O.:480] Out: 4725 [Urine:4725]  General appearance: alert, cooperative and no distress Heart: regular rate and rhythm Lungs: clear to auscultation bilaterally Abdomen: soft, non-tender; bowel sounds normal; no masses,  no organomegaly Extremities: edema improved 1-2+ Wound: clean and dry  Lab Results: Recent Labs    11/24/20 0731 11/25/20 0046  WBC 13.5* 14.3*  HGB 8.8* 8.6*  HCT 26.3* 26.7*  PLT 266 332   BMET:  Recent Labs    11/25/20 0046 11/26/20 0122  NA 134* 136  K 3.8 4.5  CL 97* 99  CO2 28 27  GLUCOSE 164* 140*  BUN 40* 37*  CREATININE 1.09 0.94  CALCIUM 8.4* 8.6*    PT/INR: No results for input(s): LABPROT, INR in the last 72 hours. ABG    Component Value Date/Time   PHART 7.418 11/21/2020 2003   HCO3 26.7 11/21/2020 2003   TCO2 28 11/21/2020 2003   O2SAT 97.0 11/21/2020 2003   CBG (last 3)  Recent Labs    11/25/20 1711 11/25/20 2113 11/26/20 0615  GLUCAP 202* 228* 126*     Assessment/Plan: S/P Procedure(s) (LRB): CORONARY ARTERY BYPASS GRAFTING (CABG) TIMES FOUR ON PUMP USING BILATERAL INTERNAL MAMMARY ARTERIES AND ENDOSCOPICALLY HARVESTED LEFT GREATER SAPHENOUS VEIN (N/A) TRANSESOPHAGEAL ECHOCARDIOGRAM (TEE) (N/A) INDOCYANINE GREEN FLUORESCENCE IMAGING (ICG) (N/A) ENDOVEIN HARVEST OF GREATER SAPHENOUS VEIN (Left)  1. CV- NSR, BP improved- on Coreg at 6.25, will monitor can restart home ARB if needed 2. Pulm- off oxygen, continue flutter valve, mucinex helped 3. Renal- creatinine WNL, weight down 6 lbs, will repeat IV lasix today, repeat Metolazone 4. Expected post operative blood loss anemia 5. DM- sugars are under better control, continue Lantus, SSIP coverage 6. Deconditioning- continue PT/OT 7. Dispo- patient stable, continue diuretics today, if patient remains stable, possibly ready for d/c in AM   LOS: 13 days    Ellwood Handler, PA-C 11/26/2020

## 2020-11-26 NOTE — Progress Notes (Signed)
CARDIAC REHAB PHASE I   PRE:  Rate/Rhythm: 81SR   BP:  Supine: 133/64     SaO2: 94 RA  MODE:  Ambulation: 200 ft   POST:  Rate/Rhythm: 94 SR  BP:  Sitting: 141/67     SaO2: 93 RA  Pt needed assistance standing, used rollator, pt unsteady while walking, used gait belt. Pt felt clammy halfway through walk and sat for 5-10 mins and needed water and wet towel. Pt was then able to ambulate back to room and sat in the recliner with call-bell at the side. Reinforced sternal precautions and importance of IS and flutter usage. Pt denies DME. His wife is wheelchair bound at home. No one to stay with pt upon discharge.   [id number]  Jeremy Hickman ACSM-EP 11/26/2020 11:14 AM

## 2020-11-26 NOTE — Op Note (Signed)
CARDIOTHORACIC SURGERY OPERATIVE NOTE  Date of Procedure: 11/21/20 Preoperative Diagnosis: Severe 3-vessel Coronary Artery Disease  Postoperative Diagnosis: Same  Procedure:    Coronary Artery Bypass Grafting x 5  Left Internal Mammary Artery to Distal Left Anterior Descending Coronary Artery; Saphenous Vein Graft to right  Posterior Descending Coronary Artery and R PLA as a sequenced graft; free right internal mammary artery graft to obtuse Marginal Branch of Left Circumflex Coronary Artery; Sapheonous Vein Graft to Diagonal Branch Coronary Artery; Endoscopic Vein Harvest from left thigh and Lower Leg Bilateral internal mammary artery harvesting Completion graft surveillance with indocyanine green fluorescence imaging  Surgeon: B.  Murvin Natal, MD  Assistant: Leretha Pol, PA-C  Anesthesia: General  Operative Findings:  Preserved left ventricular systolic function  Good quality internal mammary artery conduits  Good quality saphenous vein conduit  Good quality target vessels for grafting    BRIEF CLINICAL NOTE AND INDICATIONS FOR SURGERY  The patient presented with right lower extremity cellulitis related to a toe fracture.  During his hospitalization he was noted to have tachycardia bumped his cardiac enzymes.  He underwent left heart catheterization demonstrating severe multivessel coronary artery disease.  He has been referred for CABG.  The patient was thoroughly evaluated and is considered an appropriate candidate for the procedure   DETAILS OF THE OPERATIVE PROCEDURE  Preparation:  The patient is brought to the operating room on the above mentioned date and central monitoring was established by the anesthesia team including placement of Swan-Ganz catheter and radial arterial line. The patient is placed in the supine position on the operating table.  Intravenous antibiotics are administered. General endotracheal anesthesia is induced uneventfully. A Foley catheter is  placed.  Baseline transesophageal echocardiogram was performed.  Findings were notable for preserved LV function and no significant valvular disease  The patient's chest, abdomen, both groins, and both lower extremities are prepared and draped in a sterile manner. A time out procedure is performed.   Surgical Approach and Conduit Harvest:  A median sternotomy incision was performed and the left internal mammary artery is dissected from the chest wall and prepared for bypass grafting. The left internal mammary artery is notably good quality conduit. Simultaneously, the greater saphenous vein is obtained from the patient's left thigh using endoscopic vein harvest technique. The saphenous vein is notably good quality conduit. After removal of the saphenous vein, the small surgical incisions in the lower extremity are closed with absorbable suture.  Attention is then turned to the right hemithorax of the right internal mammary artery and its pedicle is taken the mammary graft.  Following systemic heparinization, the left internal mammary artery was transected distally noted to have excellent flow.  Both mammary artery were treated with solution of papaverine.  Attention turned to the right axillary region where an incision was made overlying the deltopectoral groove.  Dissection was carried down to subcutaneous tissue.  The pectoralis major and minor muscles were divided.  The right axillary artery was encircled.  A 10 mm graft dacryon graft was sewn end-to-side to the artery with a running suture of 5-0 Prolene.  This was then de-aired and attached to arterial limb of the cardioplegic bypass instrument   Extracorporeal Cardiopulmonary Bypass and Myocardial Protection:  The pericardium is opened. The ascending aorta is calcified in appearance, hence the right axillary artery cannulation. The  the right atrium is cannulated for cardiopulmonary bypass.  Adequate heparinization is verified.  A retrograde  cardioplegia cannula is placed through the right atrium  into the coronary sinus.  The entire pre-bypass portion of the operation was notable for stable hemodynamics.  Cardiopulmonary bypass was begun and the surface of the heart is inspected. Distal target vessels are selected for coronary artery bypass grafting. A cardioplegia cannula is placed in the ascending aorta.    The patient is allowed to cool passively to 34C systemic temperature.  The aortic cross clamp is applied and cold blood cardioplegia is delivered initially in an antegrade fashion through the aortic root.  Supplemental cardioplegia is given retrograde through the coronary sinus catheter.  Iced saline slush is applied for topical hypothermia.  The initial cardioplegic arrest is rapid with early diastolic arrest.  Repeat doses of cardioplegia are administered intermittently throughout the entire cross clamp portion of the operation through the aortic root, through the coronary sinus catheter, and through subsequently placed vein grafts in order to maintain completely flat electrocardiogram.   Coronary Artery Bypass Grafting:   The  posterior descending branch of the right coronary artery and the posterior lateral artery branch of the right coronary artery were grafted using a reversed saphenous vein graft in an sequenced fashion.  At the site of distal anastomosis the target vessels were good quality and measured approximately 1.5 mm in diameter.  The obtuse marginal branch of the left circumflex coronary artery was grafted using the pedicled right internal mammary artery graft in an end-to-side fashion.  At the site of distal anastomosis the target vessel was good quality and measured approximately 1.5 mm in diameter.  The diagonal branch of the left anterior descending coronary artery was grafted using a reversed saphenous vein graft in an end-to-side fashion.  At the site of distal anastomosis the target vessel was good quality  and measured approximately 1.5 mm in diameter.    The distal left anterior coronary artery was grafted with the left internal mammary artery in an end-to-side fashion.  At the site of distal anastomosis the target vessel was good quality and measured approximately 1.5 mm in diameter.  All proximal  graft anastomoses were placed directly to the ascending aorta prior to removal of the aortic cross clamp.  A hotshot dose of cardioplegia was given antegrade and retrograde and the aortic cross-clamp was removed after de-airing procedures  Procedure Completion:  All proximal and distal coronary anastomoses were inspected for hemostasis and appropriate graft orientation. Epicardial pacing wires are fixed to the right ventricular outflow tract and to the right atrial appendage. The patient is rewarmed to 37C temperature. The patient is weaned and disconnected from cardiopulmonary bypass.  The patient's rhythm at separation from bypass was sinus bradycardia.  The patient was weaned from cardiopulmonary bypass without any inotropic support.   Followup transesophageal echocardiogram performed after separation from bypass revealed  no changes from the preoperative exam.  The aortic and venous cannula were removed uneventfully. Protamine was administered to reverse the anticoagulation. The mediastinum and pleural space were inspected for hemostasis and irrigated with saline solution. The mediastinum and both pleural spaces were drained using fluted chest tubes placed through separate stab incisions inferiorly.  The soft tissues anterior to the aorta were reapproximated loosely. The sternum is closed with double strength sternal wire. The soft tissues anterior to the sternum were closed in multiple layers and the skin is closed with a running subcuticular skin closure.  The post-bypass portion of the operation was notable for stable rhythm and hemodynamics.     Disposition:  The patient tolerated the  procedure well and is transported  to the surgical intensive care in stable condition. There are no intraoperative complications. All sponge instrument and needle counts are verified correct at completion of the operation.    Jayme Cloud MD 11/26/2020 8:34 AM

## 2020-11-26 NOTE — Progress Notes (Signed)
Physical Therapy Treatment Patient Details Name: Jeremy Hickman MRN: [id number] DOB: [birth year] Today's Date: 11/26/2020    History of Present Illness Patient is a 70 y/o male who presents with RLE redness, swelling and weeping. Found to have RLE cellulitis, failed outpatient antibiotics. Found to have NSTEMI. Cardiac cath 11/17/20. CABG x 4 on 2/11.  PMH includes OSA on CPAP, HTN, DM-2, morbid obesity, anxiety and dementia.    PT Comments    Pt received in supine, agreeable to therapy session with encouragement, with good participation and tolerance for mobility. Primary session focus on supine/seated exercises per HEP, pt performed with good tolerance. Pt performed household distance gait trial with up to minA but deferred longer gait trial due to fatigue, of note pt wheezing somewhat and having difficulty bringing up phlegm, RN aware. VSS during mobility, pt remained up in chair with call bell at end of session. Pt continues to benefit from PT services to progress toward functional mobility goals. Continue to recommend HHPT.   Follow Up Recommendations  Home health PT;Supervision for mobility/OOB     Equipment Recommendations  Rolling walker with 5" wheels;Wheelchair (measurements PT);Wheelchair cushion (measurements PT)    Recommendations for Other Services       Precautions / Restrictions Precautions Precautions: Fall;Sternal Precaution Booklet Issued: Yes (comment) Precaution Comments: Reviewed sternal precautions Restrictions Weight Bearing Restrictions: No Other Position/Activity Restrictions: sternal precautions    Mobility  Bed Mobility Overal bed mobility: Needs Assistance Bed Mobility: Rolling;Sidelying to Sit Rolling: Min assist Sidelying to sit: Mod assist       General bed mobility comments: Min A for rolling to left and then Mod A to elevate trunk    Transfers Overall transfer level: Needs assistance Equipment used: Rolling walker (2 wheeled) Transfers:  Sit to/from Stand Sit to Stand: Min guard         General transfer comment: Min Guard A for safety and cues for hand placement from EOB and chair heights to [employer]  Ambulation/Gait Ambulation/Gait assistance: Min guard;Min Web designer (Feet): 30 Feet Assistive device: Rolling walker (2 wheeled) Gait Pattern/deviations: Step-through pattern;Decreased stride length;Trunk flexed;Shuffle;Drifts right/left;Antalgic Gait velocity: decreased   General Gait Details: short, shuffling nearly festinating gait pattern, no LOB but needs cues for compliance with sternal precautions, pt distance limited due to fatigue after longer gait trials x2 earlier in day   Stairs             Wheelchair Mobility    Modified Rankin (Stroke Patients Only)       Balance Overall balance assessment: Needs assistance Sitting-balance support: No upper extremity supported;Feet supported Sitting balance-Leahy Scale: Good     Standing balance support: No upper extremity supported;During functional activity Standing balance-Leahy Scale: Fair Standing balance comment: Static standing without UE support reliant on RW for gait                            Cognition Arousal/Alertness: Awake/alert Behavior During Therapy: Flat affect Overall Cognitive Status: History of cognitive impairments - at baseline Area of Impairment: Memory;Following commands;Awareness;Safety/judgement;Problem solving                     Memory: Decreased short-term memory;Decreased recall of precautions Following Commands: Follows one step commands with increased time;Follows multi-step commands inconsistently;Follows multi-step commands with increased time Safety/Judgement: Decreased awareness of safety;Decreased awareness of deficits Awareness: Intellectual;Emergent Problem Solving: Slow processing;Requires verbal cues General Comments: Baseline dementia. Pt with decreased problem  solving and awareness  requiring cues and increased time. Following simple commands. Pt unable to state sternal precautions and needs reinforcement for compliance, given Sternal precs handout with HEP.      Exercises General Exercises - Lower Extremity Ankle Circles/Pumps: AROM;Both;10 reps;Supine Long Arc Quad: AROM;Both;10 reps;Seated Heel Slides: AROM;Both;10 reps;Supine Hip ABduction/ADduction: AROM;Both;10 reps;Supine Straight Leg Raises: AROM;Both;10 reps;Supine Hip Flexion/Marching: AROM;Both;10 reps;Seated    General Comments General comments (skin integrity, edema, etc.): HR 88 bpm SpO2 95% on RA, RR 18-31 rpm during mobility      Pertinent Vitals/Pain Pain Assessment: Faces Faces Pain Scale: Hurts little more Pain Location: sternum Pain Descriptors / Indicators: Discomfort Pain Intervention(s): Monitored during session;Repositioned (pillow for splinting with coughs, log roll instruction)    Home Living                      Prior Function            PT Goals (current goals can now be found in the care plan section) Acute Rehab PT Goals Patient Stated Goal: to go home soon PT Goal Formulation: With patient Time For Goal Achievement: 11/30/20 Potential to Achieve Goals: Good Progress towards PT goals: Progressing toward goals    Frequency    Min 3X/week      PT Plan Current plan remains appropriate    Co-evaluation              AM-PAC PT "6 Clicks" Mobility   Outcome Measure  Help needed turning from your back to your side while in a flat bed without using bedrails?: None Help needed moving from lying on your back to sitting on the side of a flat bed without using bedrails?: A Lot Help needed moving to and from a bed to a chair (including a wheelchair)?: A Little Help needed standing up from a chair using your arms (e.g., wheelchair or bedside chair)?: A Little Help needed to walk in hospital room?: A Little Help needed climbing 3-5 steps with a railing? : A  Lot 6 Click Score: 17    End of Session Equipment Utilized During Treatment: Gait belt Activity Tolerance: Patient tolerated treatment well Patient left: in chair;with call bell/phone within reach;Other (comment) (brought chair alarm into room however RN states pt does not need and not to set it. chair alarm pad left in room near sink if staff want to use later. pt instructed on use of call bell.) Nurse Communication: Mobility status PT Visit Diagnosis: Unsteadiness on feet (R26.81);Muscle weakness (generalized) (M62.81);Other abnormalities of gait and mobility (R26.89)     Time: 9741-6384 PT Time Calculation (min) (ACUTE ONLY): 28 min  Charges:  $Gait Training: 8-22 mins $Therapeutic Exercise: 8-22 mins                     Aymar Whitfill P., PTA Acute Rehabilitation Services Pager: [phone] Office: [place] 11/26/2020, 5:55 PM

## 2020-11-26 NOTE — Progress Notes (Signed)
Mobility Specialist - Progress Note   11/26/20 1351  Mobility  Activity Ambulated in hall  Level of Assistance Minimal assist, patient does 75% or more  Assistive Device Four wheel walker  Distance Ambulated (ft) 420 ft (210 ft x 2)  Mobility Response Tolerated fair  Mobility performed by Mobility specialist  $Mobility charge 1 Mobility   Pre-mobility: 88 HR, 95% SpO2 During mobility: 98 HR, 95% SpO2, 40 RR Post-mobility: 87 HR, 93% SpO2, 23 RR  Pt min assist to stand from recliner, he says the recliner here is lower than the one at his house. Pt required an extended seated rest break halfway through due to SOB, SpO2 95% at that time. RR was 40. Pt min assist to lift legs when getting back in bed.   Pricilla Handler Mobility Specialist Mobility Specialist Phone: [phone]

## 2020-11-26 NOTE — Progress Notes (Signed)
Nutrition Follow-up  DOCUMENTATION CODES:   Obesity unspecified  INTERVENTION:    Continue Ensure MAX Protein po once daily, each supplement provides 150 kcal and 30 grams of protein  Continue Juven Fruit Punch BID, each serving provides 95kcal and 2.5g of protein (amino acids glutamine and arginine)  Continue MVI daily  NUTRITION DIAGNOSIS:   Increased nutrient needs related to acute illness as evidenced by estimated needs.  Ongoing  GOAL:   Patient will meet greater than or equal to 90% of their needs  Progressing  MONITOR:   PO intake,Supplement acceptance,Labs,Weight trends,Skin,I & O's  REASON FOR ASSESSMENT:   Consult Assessment of nutrition requirement/status  ASSESSMENT:   70 year old male with PMH of T1DM, HTN, dyslipidemia, OSA, known OSA, early dementia presenting with RLE cellulitis.   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 Last four meal completions charted as 50-100%/. Taking Ensure MAX once daily and Juven BID. Plan d/c tomorrow.   Admission weight: 108.7 kg  Current weight: 104.1 kg   UOP: 1400 ml x 24 hrs   Medications: dulcolax, colace, folic acid, SS novolog, lantus, 40 mEq KCl daily, thiamine  Labs: CBG 126-280  Diet Order:   Diet Order            Diet heart healthy/carb modified Room service appropriate? Yes; Fluid consistency: Thin  Diet effective now                 EDUCATION NEEDS:   No education needs have been identified at this time  Skin:  Skin Assessment: Skin Integrity Issues: Skin Integrity Issues:: Incisions Incisions: L leg, chest  Last BM:  2/14  Height:   Ht Readings from Last 1 Encounters:  11/21/20 5\' 8"  (1.727 m)    Weight:   Wt Readings from Last 1 Encounters:  11/26/20 104.1 kg    Ideal Body Weight:  70 kg  BMI:  Body mass index is 34.9 kg/m.  Estimated Nutritional Needs:   Kcal:  1900-2100  Protein:  110-125 grams  Fluid:  >  1.9 L  Mariana Single RD, LDN Clinical Nutrition Pager listed in [place]

## 2020-11-27 ENCOUNTER — Encounter (HOSPITAL_COMMUNITY): Payer: Self-pay | Admitting: Internal Medicine

## 2020-11-27 ENCOUNTER — Other Ambulatory Visit: Payer: Self-pay | Admitting: Physician Assistant

## 2020-11-27 LAB — BASIC METABOLIC PANEL
Anion gap: 8 (ref 5–15)
BUN: 33 mg/dL — ABNORMAL HIGH (ref 8–23)
CO2: 31 mmol/L (ref 22–32)
Calcium: 8.5 mg/dL — ABNORMAL LOW (ref 8.9–10.3)
Chloride: 95 mmol/L — ABNORMAL LOW (ref 98–111)
Creatinine, Ser: 1.07 mg/dL (ref 0.61–1.24)
GFR, Estimated: >60 mL/min (ref >60)
Glucose, Bld: 173 mg/dL — ABNORMAL HIGH (ref 70–99)
Potassium: 4.2 mmol/L (ref 3.5–5.1)
Sodium: 134 mmol/L — ABNORMAL LOW (ref 135–145)

## 2020-11-27 LAB — GLUCOSE, CAPILLARY
Glucose-Capillary: 122 mg/dL — ABNORMAL HIGH (ref 70–99)
Glucose-Capillary: 247 mg/dL — ABNORMAL HIGH (ref 70–99)
Glucose-Capillary: 265 mg/dL — ABNORMAL HIGH (ref 70–99)

## 2020-11-27 MED ORDER — ATORVASTATIN CALCIUM 80 MG PO TABS: 80.0000 mg | ORAL_TABLET | Freq: Every day | ORAL | 3 refills | Status: AC

## 2020-11-27 MED ORDER — OXYCODONE HCL 5 MG PO TABS
5.0000 mg | ORAL_TABLET | ORAL | 0 refills | Status: DC | PRN
Start: 2020-11-27 — End: 2020-11-27

## 2020-11-27 MED ORDER — OXYCODONE HCL 5 MG PO TABS: 5.0000 mg | ORAL_TABLET | ORAL | 0 refills | Status: AC | PRN

## 2020-11-27 MED ORDER — POTASSIUM CHLORIDE CRYS ER 20 MEQ PO TBCR: 40.0000 meq | EXTENDED_RELEASE_TABLET | Freq: Every day | ORAL | 3 refills | Status: AC

## 2020-11-27 NOTE — Progress Notes (Signed)
   CARDIOLOGY RECOMMENDATIONS:  Discharge is anticipated in the next 48 hours. Recommendations for medications and follow up:  Discharge Medications:  Continue medications as they are currently listed in the [facility]. Exceptions to the above:  None  Follow Up: The patient's Primary Cardiologist is: Dr. Collins [place] Alvester Chou) Atilano Median, [place]  Follow up in the office in 2 week(s).  Signed,   Pixie Casino, MD, FACC, [place] Director of the [facility] Diplomate of the American Board of Clinical Lipidology Attending Cardiologist  Direct Dial: [phone]  Fax: [phone]  Website:  [email or web address]  Pixie Casino, MD  9:19 AM 11/27/2020  [facility]

## 2020-11-27 NOTE — Progress Notes (Signed)
CARDIAC REHAB PHASE I   Went to complete d/c education with pt. Pt states he does not feel strong enough to go home. Pt indecisive on what he wants to do. RN made aware. Will f/u once decision is made.  [phone] Rufina Falco, RN BSN 11/27/2020 9:31 AM

## 2020-11-27 NOTE — Progress Notes (Signed)
PT chest tube sutures removed. Tolerated well. No steri strips needed. Skin scabbed well.  Jerald Kief, RN

## 2020-11-27 NOTE — Progress Notes (Addendum)
CARDIAC REHAB PHASE I   PRE:  Rate/Rhythm: 92 SR with PVCs  BP:  Sitting: 115/61      SaO2: 93 RA  MODE:  Ambulation: 300 ft   POST:  Rate/Rhythm: 103 ST with PVCs  BP:  Sitting: 133/64     SaO2: 90 RA   Pt states he changed his mind and want to go home. Pt ambulated 31ft in hallway and needed a sitting rest break. Pt then ambulated 257ft and returned to recliner. D/c education completed with pt. Pt educated on importance of site care and monitoring incision daily. Encouraged continued IS use, flutter use, walks, and sternal precautions. Pt given in-the-tube sheet along with heart healthy and diabetic diets. Reviewed restrictions and exercise guidelines. Pt states he has DME at home, and a friend that can help if needed. Pt referred to [facility] [place].  [phone] Rufina Falco, RN BSN 11/27/2020 11:23 AM

## 2020-11-27 NOTE — Care Management Important Message (Signed)
Important Message  Patient Details  Name: Jeremy Hickman MRN: [id number] Date of Birth: 08-Jul-1951   Medicare Important Message Given:  Yes     Shelda Altes 11/27/2020, 10:37 AM

## 2020-11-27 NOTE — Progress Notes (Signed)
Mobility Specialist: Progress Note   11/27/20 1159  Mobility  Activity Refused mobility   Pt refused mobility stating he had just finished walking not too long ago and is supposed to discharge soon. RN present in room.   [facility] Audryana Hockenberry Mobility Specialist Mobility Specialist Phone: [phone]

## 2020-11-27 NOTE — Progress Notes (Addendum)
      [address]       [place] [address]             [phone]       6 Days Post-Op Procedure(s) (LRB): CORONARY ARTERY BYPASS GRAFTING (CABG) TIMES FOUR ON PUMP USING BILATERAL INTERNAL MAMMARY ARTERIES AND ENDOSCOPICALLY HARVESTED LEFT GREATER SAPHENOUS VEIN (N/A) TRANSESOPHAGEAL ECHOCARDIOGRAM (TEE) (N/A) INDOCYANINE GREEN FLUORESCENCE IMAGING (ICG) (N/A) ENDOVEIN HARVEST OF GREATER SAPHENOUS VEIN (Left)   Subjective:  No new complaints.  Sitting up eating breakfast.  Hoping to go home today.  Objective: Vital signs in last 24 hours: Temp:  [97.5 F (36.4 C)-98.5 F (36.9 C)] 98.5 F (36.9 C) (02/17 0357) Pulse Rate:  [83-89] 89 (02/17 0357) Cardiac Rhythm: Normal sinus rhythm;Bundle branch block (02/16 1939) Resp:  [13-20] 16 (02/17 0357) BP: (131-157)/(63-74) 131/63 (02/17 0357) SpO2:  [91 %-94 %] 91 % (02/17 0357) Weight:  [101.3 kg] 101.3 kg (02/17 0528)  Intake/Output from previous day: 02/16 0701 - 02/17 0700 In: 480 [P.O.:480] Out: 3400 [Urine:3400]  General appearance: alert, cooperative and no distress Heart: regular rate and rhythm Lungs: clear to auscultation bilaterally Abdomen: soft, non-tender; bowel sounds normal; no masses,  no organomegaly Extremities: edema pitting improved down to 1+ Wound: clean and dry  Lab Results: Recent Labs    11/25/20 0046  WBC 14.3*  HGB 8.6*  HCT 26.7*  PLT 332   BMET:  Recent Labs    11/26/20 0122 11/27/20 0059  NA 136 134*  K 4.5 4.2  CL 99 95*  CO2 27 31  GLUCOSE 140* 173*  BUN 37* 33*  CREATININE 0.94 1.07  CALCIUM 8.6* 8.5*    PT/INR: No results for input(s): LABPROT, INR in the last 72 hours. ABG    Component Value Date/Time   PHART 7.418 11/21/2020 2003   HCO3 26.7 11/21/2020 2003   TCO2 28 11/21/2020 2003   O2SAT 97.0 11/21/2020 2003   CBG (last 3)  Recent Labs    11/26/20 1642 11/26/20 2127 11/27/20 0619  GLUCAP 266* 289* 122*    Assessment/Plan: S/P  Procedure(s) (LRB): CORONARY ARTERY BYPASS GRAFTING (CABG) TIMES FOUR ON PUMP USING BILATERAL INTERNAL MAMMARY ARTERIES AND ENDOSCOPICALLY HARVESTED LEFT GREATER SAPHENOUS VEIN (N/A) TRANSESOPHAGEAL ECHOCARDIOGRAM (TEE) (N/A) INDOCYANINE GREEN FLUORESCENCE IMAGING (ICG) (N/A) ENDOVEIN HARVEST OF GREATER SAPHENOUS VEIN (Left)  1. CV- NSR, remains on Coreg, BP elevated at times will restart home ARB at reduced dose 2. Pulm- off oxygen, continue IS 3. Renal- creatinine remains stable, will taper lasix over next week 4. DM- sugar controlled improved 5. Dispo- patient stable, will d/c home today   LOS: 14 days    Ellwood Handler, PA-C 11/27/2020 Pt seen and examined; doing very well after CABG. Ready for d/c Elba Schaber Z. Orvan Seen, [place]

## 2020-11-27 NOTE — TOC Transition Note (Signed)
Transition of Care (TOC) - CM/SW Discharge Note Marvetta Gibbons RN, BSN Transitions of Care Unit 4E- RN Case Manager See Treatment Team for direct phone #    Patient Details  Name: SHERLEY LESER MRN: [id number] Date of Birth: 1951-10-01  Transition of Care [facility]) CM/SW Contact:  Dawayne Patricia, RN Phone Number: 11/27/2020, 1:00 PM   Clinical Narrative:    Pt stable for transition home today, no further TOC needs noted, pt has transportation home. No DME needs and has declined [place] services.    Final next level of care: Home/Self Care Barriers to Discharge: Barriers Resolved   Patient Goals and CMS Choice Patient states their goals for this hospitalization and ongoing recovery are:: return home with wife CMS Medicare.gov Compare Post Acute Care list provided to:: Patient Choice offered to / list presented to : Patient  Discharge Placement                 Home      Discharge Plan and Services   Discharge Planning Services: CM Consult Post Acute Care Choice: Home Health          DME Arranged: N/A DME Agency: NA       HH Arranged: PT,Patient Refused HH          Social Determinants of Health (SDOH) Interventions     Readmission Risk Interventions No flowsheet data found.

## 2020-11-29 DIAGNOSIS — F39 Unspecified mood [affective] disorder: Secondary | ICD-10-CM | POA: Diagnosis not present

## 2020-11-29 DIAGNOSIS — E785 Hyperlipidemia, unspecified: Secondary | ICD-10-CM | POA: Diagnosis not present

## 2020-11-29 DIAGNOSIS — E7849 Other hyperlipidemia: Secondary | ICD-10-CM | POA: Diagnosis not present

## 2020-11-29 DIAGNOSIS — Z741 Need for assistance with personal care: Secondary | ICD-10-CM | POA: Diagnosis not present

## 2020-11-29 DIAGNOSIS — E1142 Type 2 diabetes mellitus with diabetic polyneuropathy: Secondary | ICD-10-CM | POA: Diagnosis not present

## 2020-11-29 DIAGNOSIS — E8779 Other fluid overload: Secondary | ICD-10-CM | POA: Diagnosis not present

## 2020-11-29 DIAGNOSIS — Z794 Long term (current) use of insulin: Secondary | ICD-10-CM | POA: Diagnosis not present

## 2020-11-29 DIAGNOSIS — I5042 Chronic combined systolic (congestive) and diastolic (congestive) heart failure: Secondary | ICD-10-CM | POA: Diagnosis not present

## 2020-11-29 DIAGNOSIS — I509 Heart failure, unspecified: Secondary | ICD-10-CM | POA: Diagnosis not present

## 2020-11-29 DIAGNOSIS — M7989 Other specified soft tissue disorders: Secondary | ICD-10-CM | POA: Diagnosis not present

## 2020-11-29 DIAGNOSIS — E119 Type 2 diabetes mellitus without complications: Secondary | ICD-10-CM | POA: Diagnosis not present

## 2020-11-29 DIAGNOSIS — R609 Edema, unspecified: Secondary | ICD-10-CM | POA: Diagnosis not present

## 2020-11-29 DIAGNOSIS — Z87891 Personal history of nicotine dependence: Secondary | ICD-10-CM | POA: Diagnosis not present

## 2020-11-29 DIAGNOSIS — I5033 Acute on chronic diastolic (congestive) heart failure: Secondary | ICD-10-CM | POA: Diagnosis not present

## 2020-11-29 DIAGNOSIS — E782 Mixed hyperlipidemia: Secondary | ICD-10-CM | POA: Diagnosis not present

## 2020-11-29 DIAGNOSIS — R2242 Localized swelling, mass and lump, left lower limb: Secondary | ICD-10-CM | POA: Diagnosis not present

## 2020-11-29 DIAGNOSIS — E1042 Type 1 diabetes mellitus with diabetic polyneuropathy: Secondary | ICD-10-CM | POA: Diagnosis not present

## 2020-11-29 DIAGNOSIS — I10 Essential (primary) hypertension: Secondary | ICD-10-CM | POA: Diagnosis not present

## 2020-11-29 DIAGNOSIS — J9601 Acute respiratory failure with hypoxia: Secondary | ICD-10-CM | POA: Diagnosis not present

## 2020-11-29 DIAGNOSIS — I450 Right fascicular block: Secondary | ICD-10-CM | POA: Diagnosis not present

## 2020-11-29 DIAGNOSIS — Z5189 Encounter for other specified aftercare: Secondary | ICD-10-CM | POA: Diagnosis not present

## 2020-11-29 DIAGNOSIS — G4700 Insomnia, unspecified: Secondary | ICD-10-CM | POA: Diagnosis not present

## 2020-11-29 DIAGNOSIS — J811 Chronic pulmonary edema: Secondary | ICD-10-CM | POA: Diagnosis not present

## 2020-11-29 DIAGNOSIS — D649 Anemia, unspecified: Secondary | ICD-10-CM | POA: Diagnosis not present

## 2020-11-29 DIAGNOSIS — I5041 Acute combined systolic (congestive) and diastolic (congestive) heart failure: Secondary | ICD-10-CM | POA: Diagnosis not present

## 2020-11-29 DIAGNOSIS — Z7982 Long term (current) use of aspirin: Secondary | ICD-10-CM | POA: Diagnosis not present

## 2020-11-29 DIAGNOSIS — F09 Unspecified mental disorder due to known physiological condition: Secondary | ICD-10-CM | POA: Diagnosis not present

## 2020-11-29 DIAGNOSIS — G4733 Obstructive sleep apnea (adult) (pediatric): Secondary | ICD-10-CM | POA: Diagnosis not present

## 2020-11-29 DIAGNOSIS — I252 Old myocardial infarction: Secondary | ICD-10-CM | POA: Diagnosis not present

## 2020-11-29 DIAGNOSIS — I5020 Unspecified systolic (congestive) heart failure: Secondary | ICD-10-CM | POA: Diagnosis not present

## 2020-11-29 DIAGNOSIS — E109 Type 1 diabetes mellitus without complications: Secondary | ICD-10-CM | POA: Diagnosis not present

## 2020-11-29 DIAGNOSIS — Z7409 Other reduced mobility: Secondary | ICD-10-CM | POA: Diagnosis not present

## 2020-11-29 DIAGNOSIS — I872 Venous insufficiency (chronic) (peripheral): Secondary | ICD-10-CM | POA: Diagnosis not present

## 2020-11-29 DIAGNOSIS — R269 Unspecified abnormalities of gait and mobility: Secondary | ICD-10-CM | POA: Diagnosis not present

## 2020-11-29 DIAGNOSIS — I5023 Acute on chronic systolic (congestive) heart failure: Secondary | ICD-10-CM | POA: Diagnosis not present

## 2020-11-29 DIAGNOSIS — Z6830 Body mass index (BMI) 30.0-30.9, adult: Secondary | ICD-10-CM | POA: Diagnosis not present

## 2020-11-29 DIAGNOSIS — I2510 Atherosclerotic heart disease of native coronary artery without angina pectoris: Secondary | ICD-10-CM | POA: Diagnosis not present

## 2020-11-29 DIAGNOSIS — I110 Hypertensive heart disease with heart failure: Secondary | ICD-10-CM | POA: Diagnosis not present

## 2020-11-29 DIAGNOSIS — I248 Other forms of acute ischemic heart disease: Secondary | ICD-10-CM | POA: Diagnosis not present

## 2020-11-29 DIAGNOSIS — Z79899 Other long term (current) drug therapy: Secondary | ICD-10-CM | POA: Diagnosis not present

## 2020-11-29 DIAGNOSIS — Z955 Presence of coronary angioplasty implant and graft: Secondary | ICD-10-CM | POA: Diagnosis not present

## 2020-11-29 DIAGNOSIS — J810 Acute pulmonary edema: Secondary | ICD-10-CM | POA: Diagnosis not present

## 2020-11-29 DIAGNOSIS — L03115 Cellulitis of right lower limb: Secondary | ICD-10-CM | POA: Diagnosis not present

## 2020-11-29 DIAGNOSIS — Z951 Presence of aortocoronary bypass graft: Secondary | ICD-10-CM | POA: Diagnosis not present

## 2020-11-29 DIAGNOSIS — F909 Attention-deficit hyperactivity disorder, unspecified type: Secondary | ICD-10-CM | POA: Diagnosis not present

## 2020-11-29 DIAGNOSIS — F0390 Unspecified dementia without behavioral disturbance: Secondary | ICD-10-CM | POA: Diagnosis not present

## 2020-11-29 DIAGNOSIS — I429 Cardiomyopathy, unspecified: Secondary | ICD-10-CM | POA: Diagnosis not present

## 2020-11-29 DIAGNOSIS — R600 Localized edema: Secondary | ICD-10-CM | POA: Diagnosis not present

## 2020-11-29 DIAGNOSIS — E669 Obesity, unspecified: Secondary | ICD-10-CM | POA: Diagnosis not present

## 2020-11-29 DIAGNOSIS — R41841 Cognitive communication deficit: Secondary | ICD-10-CM | POA: Diagnosis not present

## 2020-12-01 ENCOUNTER — Telehealth: Payer: Self-pay | Admitting: *Deleted

## 2020-12-01 NOTE — Discharge Summary (Signed)
      [address]       [place] [address]             [phone]      This is an Addendum to previously dictated discharge summary from 11/24/2020.  The patient was ruled out for sepsis during his hospitalization.  Ellwood Handler, PA-C

## 2020-12-01 NOTE — Telephone Encounter (Signed)
Called pt regarding message from over the weekend r/t 4lb weight gain. Per pt, he is currently in the hospital in [facility]. Pt to f/u with Dr. Orvan Seen 3/7. No further questions at this time.

## 2020-12-03 DIAGNOSIS — G4733 Obstructive sleep apnea (adult) (pediatric): Secondary | ICD-10-CM | POA: Diagnosis not present

## 2020-12-03 DIAGNOSIS — Z794 Long term (current) use of insulin: Secondary | ICD-10-CM | POA: Diagnosis not present

## 2020-12-03 DIAGNOSIS — I429 Cardiomyopathy, unspecified: Secondary | ICD-10-CM | POA: Diagnosis not present

## 2020-12-03 DIAGNOSIS — E1042 Type 1 diabetes mellitus with diabetic polyneuropathy: Secondary | ICD-10-CM | POA: Diagnosis not present

## 2020-12-03 DIAGNOSIS — I110 Hypertensive heart disease with heart failure: Secondary | ICD-10-CM | POA: Diagnosis not present

## 2020-12-03 DIAGNOSIS — Z7409 Other reduced mobility: Secondary | ICD-10-CM | POA: Diagnosis not present

## 2020-12-03 DIAGNOSIS — I5041 Acute combined systolic (congestive) and diastolic (congestive) heart failure: Secondary | ICD-10-CM | POA: Diagnosis not present

## 2020-12-03 DIAGNOSIS — Z9989 Dependence on other enabling machines and devices: Secondary | ICD-10-CM | POA: Diagnosis not present

## 2020-12-03 DIAGNOSIS — R0602 Shortness of breath: Secondary | ICD-10-CM | POA: Diagnosis not present

## 2020-12-03 DIAGNOSIS — Z951 Presence of aortocoronary bypass graft: Secondary | ICD-10-CM | POA: Diagnosis not present

## 2020-12-03 DIAGNOSIS — I5042 Chronic combined systolic (congestive) and diastolic (congestive) heart failure: Secondary | ICD-10-CM | POA: Diagnosis not present

## 2020-12-03 DIAGNOSIS — L03115 Cellulitis of right lower limb: Secondary | ICD-10-CM | POA: Diagnosis not present

## 2020-12-03 DIAGNOSIS — I2510 Atherosclerotic heart disease of native coronary artery without angina pectoris: Secondary | ICD-10-CM | POA: Diagnosis not present

## 2020-12-03 DIAGNOSIS — E669 Obesity, unspecified: Secondary | ICD-10-CM | POA: Diagnosis not present

## 2020-12-03 DIAGNOSIS — E119 Type 2 diabetes mellitus without complications: Secondary | ICD-10-CM | POA: Diagnosis not present

## 2020-12-03 DIAGNOSIS — E109 Type 1 diabetes mellitus without complications: Secondary | ICD-10-CM | POA: Diagnosis not present

## 2020-12-03 DIAGNOSIS — D649 Anemia, unspecified: Secondary | ICD-10-CM | POA: Diagnosis not present

## 2020-12-03 DIAGNOSIS — Z741 Need for assistance with personal care: Secondary | ICD-10-CM | POA: Diagnosis not present

## 2020-12-03 DIAGNOSIS — G4700 Insomnia, unspecified: Secondary | ICD-10-CM | POA: Diagnosis not present

## 2020-12-03 DIAGNOSIS — R41841 Cognitive communication deficit: Secondary | ICD-10-CM | POA: Diagnosis not present

## 2020-12-03 DIAGNOSIS — Z5189 Encounter for other specified aftercare: Secondary | ICD-10-CM | POA: Diagnosis not present

## 2020-12-03 DIAGNOSIS — J9601 Acute respiratory failure with hypoxia: Secondary | ICD-10-CM | POA: Diagnosis not present

## 2020-12-03 DIAGNOSIS — Z6830 Body mass index (BMI) 30.0-30.9, adult: Secondary | ICD-10-CM | POA: Diagnosis not present

## 2020-12-03 DIAGNOSIS — E785 Hyperlipidemia, unspecified: Secondary | ICD-10-CM | POA: Diagnosis not present

## 2020-12-03 DIAGNOSIS — R269 Unspecified abnormalities of gait and mobility: Secondary | ICD-10-CM | POA: Diagnosis not present

## 2020-12-04 ENCOUNTER — Telehealth (HOSPITAL_COMMUNITY): Payer: Self-pay

## 2020-12-04 NOTE — Telephone Encounter (Signed)
Cardiac rehab referral for Ph.II faxed to [place].

## 2020-12-10 DIAGNOSIS — R0602 Shortness of breath: Secondary | ICD-10-CM | POA: Diagnosis not present

## 2020-12-12 ENCOUNTER — Other Ambulatory Visit: Payer: Self-pay | Admitting: Cardiothoracic Surgery

## 2020-12-12 DIAGNOSIS — Z951 Presence of aortocoronary bypass graft: Secondary | ICD-10-CM

## 2020-12-15 ENCOUNTER — Ambulatory Visit (INDEPENDENT_AMBULATORY_CARE_PROVIDER_SITE_OTHER): Payer: Self-pay | Admitting: Cardiothoracic Surgery

## 2020-12-15 ENCOUNTER — Other Ambulatory Visit: Payer: Self-pay

## 2020-12-15 ENCOUNTER — Ambulatory Visit
Admission: RE | Admit: 2020-12-15 | Discharge: 2020-12-15 | Disposition: A | Discharge status: Home / Self Care | Payer: HMO | Source: Ambulatory Visit | Attending: Cardiothoracic Surgery | Admitting: Cardiothoracic Surgery

## 2020-12-15 VITALS — BP 120/73 | HR 83 | Resp 20 | Ht 68.0 in | Wt 209.0 lb

## 2020-12-15 DIAGNOSIS — Z951 Presence of aortocoronary bypass graft: Secondary | ICD-10-CM | POA: Diagnosis not present

## 2020-12-15 NOTE — Progress Notes (Signed)
Jeremy Hickman       ,[place] [address]             [phone]     CARDIOTHORACIC SURGERY OFFICE NOTE  Referring Provider is Jerline Pain, MD Primary Cardiologist is No primary care provider on file. PCP is Drosinis, [name], PA-C   HPI:  70 yo man s/p CABG; doing well without c/o. Denies angina, f/c.  Appetite has been sub-par but getting better   Current Outpatient Medications  Medication Sig Dispense Refill  . acetaminophen (TYLENOL) 500 MG tablet Take 2 tablets (1,000 mg total) by mouth every 6 (six) hours as needed for mild pain, fever or headache. 30 tablet 0  . aspirin EC 325 MG EC tablet Take 1 tablet (325 mg total) by mouth daily. 30 tablet 0  . atorvastatin (LIPITOR) 80 MG tablet Take 1 tablet (80 mg total) by mouth daily. 30 tablet 3  . Blood Glucose Monitoring Suppl (FIFTY50 GLUCOSE METER 2.0) w/Device KIT Use as instructed    . busPIRone (BUSPAR) 15 MG tablet Take 15-30 mg by mouth See admin instructions. Taking 30 mg in the AM and 15 mg in the evening.    . carvedilol (COREG) 6.25 MG tablet Take 6.25 mg by mouth 2 (two) times daily with a meal.    . cetirizine (ZYRTEC) 10 MG tablet Take 10 mg by mouth daily.     . diphenhydrAMINE (BENADRYL) 25 mg capsule Take 25 mg by mouth every 6 (six) hours as needed for allergies.    Marland Kitchen donepezil (ARICEPT) 5 MG tablet Take 5 mg by mouth at bedtime.    Marland Kitchen ezetimibe (ZETIA) 10 MG tablet Take 10 mg by mouth daily.    . fenofibrate micronized (ANTARA) 130 MG capsule Take 1 tablet daily    . Flax Oil-Fish Oil-Borage Oil CAPS Take 1,000 capsules by mouth daily.     Marland Kitchen FLUoxetine (PROZAC) 40 MG capsule Take 40 mg by mouth daily.    . furosemide (LASIX) 20 MG tablet Take 20-40 mg by mouth See admin instructions. Takes 83m in the morning and 218min the afternoon    . guaiFENesin (MUCINEX) 600 MG 12 hr tablet Take 2 tablets (1,200 mg total) by mouth 2 (two) times daily as needed.    . hydrocerin (EUCERIN) CREA  Apply 1 application topically daily. To lower extremities as needed  0  . insulin glargine (LANTUS) 100 UNIT/ML injection Inject 42 Units into the skin at bedtime.    . Marland Kitchenetoconazole (NIZORAL) 2 % shampoo Apply 1 application topically 2 (two) times a week.    . Melatonin 3 MG CAPS Take 3 mg by mouth at bedtime.    . Multiple Vitamin (MULTI-VITAMINS) TABS Take 1 tablet by mouth daily.     . Marland KitchenOVOLOG FLEXPEN RELION 100 UNIT/ML FlexPen Inject 10-25 Units into the skin 3 (three) times daily before meals.    . Marland KitchenxyCODONE (OXY IR/ROXICODONE) 5 MG immediate release tablet Take 1 tablet (5 mg total) by mouth every 4 (four) hours as needed for severe pain. 30 tablet 0  . polyethylene glycol (MIRALAX / GLYCOLAX) 17 g packet Take 17 g by mouth daily as needed for mild constipation. 14 each 0  . potassium chloride SA (KLOR-CON) 20 MEQ tablet Take 2 tablets (40 mEq total) by mouth daily. 30 tablet 3  . telmisartan (MICARDIS) 80 MG tablet Take 80 mg by mouth daily.    . temazepam (RESTORIL) 15 MG capsule  Take 15 mg by mouth at bedtime as needed for sleep.    Marland Kitchen terazosin (HYTRIN) 5 MG capsule Take 5 mg by mouth at bedtime.    . traZODone (DESYREL) 50 MG tablet Take 50 mg by mouth at bedtime.     No current facility-administered medications for this visit.      Physical Exam:   BP 120/73 (BP Location: Right Arm, Patient Position: Sitting)   Pulse 83   Resp 20   Ht 5' 8"  (1.727 m)   Wt 94.8 kg   SpO2 94% Comment: RA  BMI 31.78 kg/m   General:  Well-appearing, NAD  Chest:   cta  CV:   rrr  Incisions:  Healing well  Abdomen:  snt  Extremities:  Mild edema, R>L  Diagnostic Tests:  CXR with clear lung fields and stable mediastinal silhouette  Impression:  Doing well after CABG  Plan:  F/u with thoracic surgery prn Ok to drive   I spent in excess of 15 minutes during the conduct of this office consultation and >50% of this time involved direct face-to-face encounter with the patient for  counseling and/or coordination of their care.  Level 2                 10 minutes Level 3                 15 minutes Level 4                 25 minutes Level 5                 40 minutes  B. Murvin Natal, MD 12/15/2020 1:24 PM

## 2020-12-16 ENCOUNTER — Encounter: Payer: Self-pay | Admitting: Physician Assistant

## 2020-12-16 ENCOUNTER — Ambulatory Visit (INDEPENDENT_AMBULATORY_CARE_PROVIDER_SITE_OTHER): Payer: HMO | Admitting: Physician Assistant

## 2020-12-16 VITALS — BP 90/50 | HR 80 | Ht 68.0 in | Wt 208.8 lb

## 2020-12-16 DIAGNOSIS — I2510 Atherosclerotic heart disease of native coronary artery without angina pectoris: Secondary | ICD-10-CM

## 2020-12-16 DIAGNOSIS — I5042 Chronic combined systolic (congestive) and diastolic (congestive) heart failure: Secondary | ICD-10-CM

## 2020-12-16 DIAGNOSIS — I10 Essential (primary) hypertension: Secondary | ICD-10-CM | POA: Diagnosis not present

## 2020-12-16 DIAGNOSIS — E785 Hyperlipidemia, unspecified: Secondary | ICD-10-CM | POA: Diagnosis not present

## 2020-12-16 DIAGNOSIS — Z951 Presence of aortocoronary bypass graft: Secondary | ICD-10-CM

## 2020-12-16 NOTE — Progress Notes (Signed)
Cardiology Office Note:    Date:  12/16/2020   ID:  Jeremy Hickman, DOB 01-03-1951, MRN [id number]  PCP:  Drosinis, [name], PA-C  [place] Cardiologist:  No primary care provider on file. [facility] (Dr. Atilano Median) [place] Electrophysiologist:  None   Chief Complaint: Hospital follow up  History of Present Illness:    Jeremy Hickman is a 70 y.o. male with a hx of with a history of CAD with remote inferior MI s/p stenting to RCA in 1993, hypertension, hyperlipidemia, diabetes mellitus, obstructive sleep apnea on CPAP, obesity,and dementia presents for follow up.   Admitted with NSTEMI 11/14/20. He was found to have ischemic cardiomyopathy with LVEF of 40-45% and grade 1 DD.  LHC revealed multi-vessel disease. s/p  CABG 11/22/20 with LIMA to LAD, SVG to D1, SVT to PDA sequential PLB, RIMA to OM on 11/22/20.  He was doing well when seen by Dr. Orvan Seen yesterday. BP was 120/73.  Patient is here for follow-up.  Noted blood pressure 90/50.  It was normal yesterday.  He is going to get blood pressure cuff tomorrow.  Intermittent fatigue but not sustained.  No chest pain, shortness of breath, orthopnea, PND, syncope, lower extremity edema or melena.  Reports improving energy.  He is walking to mailbox multiple times with and without walker without problem.  Past Medical History:  Diagnosis Date  . Cellulitis and abscess of right leg 11/13/2020  . Class 2 obesity due to excess calories with body mass index (BMI) of 36.0 to 36.9 in adult 11/13/2020  . Dementia ([place])   . Diabetes mellitus without complication ([place])   . Hypertension     Past Surgical History:  Procedure Laterality Date  . CATARACT EXTRACTION    . COLONOSCOPY    . CORONARY ARTERY BYPASS GRAFT N/A 11/21/2020   Procedure: CORONARY ARTERY BYPASS GRAFTING (CABG) TIMES FOUR ON PUMP USING BILATERAL INTERNAL MAMMARY ARTERIES AND ENDOSCOPICALLY HARVESTED LEFT GREATER SAPHENOUS VEIN;  Surgeon: Wonda Olds, MD;  Location: [place];  Service: Open Heart Surgery;  [name];  . ENDOVEIN HARVEST OF GREATER SAPHENOUS VEIN Left 11/21/2020   Procedure: ENDOVEIN HARVEST OF GREATER SAPHENOUS VEIN;  Surgeon: Wonda Olds, MD;  Location: [place];  Service: Open Heart Surgery;  Laterality: Left;  . EYE SURGERY    . HEMORROIDECTOMY    . RIGHT/LEFT HEART CATH AND CORONARY ANGIOGRAPHY N/A 11/18/2020   Procedure: RIGHT/LEFT HEART CATH AND CORONARY ANGIOGRAPHY;  [name], MD;  Location: [place] [facility];  Service: Cardiovascular;  [name];  . ROTATOR CUFF REPAIR    . TEE WITHOUT CARDIOVERSION N/A 11/21/2020   Procedure: TRANSESOPHAGEAL ECHOCARDIOGRAM (TEE);  Surgeon: Wonda Olds, MD;  Location: [place];  Service: Open Heart Surgery;  [name];    Current Medications: Current Meds  Medication Sig  . acetaminophen (TYLENOL) 500 MG tablet Take 2 tablets (1,000 mg total) by mouth every 6 (six) hours as needed for mild pain, fever or headache.  Marland Kitchen aspirin EC 325 MG EC tablet Take 1 tablet (325 mg total) by mouth daily.  Marland Kitchen atorvastatin (LIPITOR) 80 MG tablet Take 1 tablet (80 mg total) by mouth daily.  . Blood Glucose Monitoring Suppl (FIFTY50 GLUCOSE METER 2.0) w/Device KIT Use as instructed  . busPIRone (BUSPAR) 15 MG tablet Take 15-30 mg by mouth See admin instructions. Taking 30 mg in the AM and 15 mg in the evening.  . carvedilol (COREG) 6.25 MG tablet Take 6.25  mg by mouth 2 (two) times daily with a meal.  . cetirizine (ZYRTEC) 10 MG tablet Take 10 mg by mouth daily.   . diphenhydrAMINE (BENADRYL) 25 mg capsule Take 25 mg by mouth every 6 (six) hours as needed for allergies.  Marland Kitchen donepezil (ARICEPT) 5 MG tablet Take 5 mg by mouth at bedtime.  Marland Kitchen ezetimibe (ZETIA) 10 MG tablet Take 10 mg by mouth daily.  . fenofibrate micronized (ANTARA) 130 MG capsule Take 1 tablet daily  . Flax Oil-Fish Oil-Borage Oil CAPS Take 1,000 capsules by mouth daily.   Marland Kitchen FLUoxetine (PROZAC) 40 MG capsule Take 40 mg  by mouth daily.  . furosemide (LASIX) 20 MG tablet Take 20-40 mg by mouth See admin instructions. Takes 67m in the morning and 29min the afternoon  . guaiFENesin (MUCINEX) 600 MG 12 hr tablet Take 2 tablets (1,200 mg total) by mouth 2 (two) times daily as needed.  . hydrocerin (EUCERIN) CREA Apply 1 application topically daily. To lower extremities as needed  . insulin glargine (LANTUS) 100 UNIT/ML injection Inject 42 Units into the skin at bedtime.  . Marland Kitchenetoconazole (NIZORAL) 2 % shampoo Apply 1 application topically 2 (two) times a week.  . Melatonin 3 MG CAPS Take 3 mg by mouth at bedtime.  . Multiple Vitamin (MULTI-VITAMINS) TABS Take 1 tablet by mouth daily.   . Marland KitchenOVOLOG FLEXPEN RELION 100 UNIT/ML FlexPen Inject 10-25 Units into the skin 3 (three) times daily before meals.  . Marland KitchenxyCODONE (OXY IR/ROXICODONE) 5 MG immediate release tablet Take 1 tablet (5 mg total) by mouth every 4 (four) hours as needed for severe pain.  . polyethylene glycol (MIRALAX / GLYCOLAX) 17 g packet Take 17 g by mouth daily as needed for mild constipation.  . potassium chloride SA (KLOR-CON) 20 MEQ tablet Take 2 tablets (40 mEq total) by mouth daily.  . Marland Kitchenelmisartan (MICARDIS) 80 MG tablet Take 80 mg by mouth daily.  . temazepam (RESTORIL) 15 MG capsule Take 15 mg by mouth at bedtime as needed for sleep.  . Marland Kitchenerazosin (HYTRIN) 5 MG capsule Take 5 mg by mouth at bedtime.  . traZODone (DESYREL) 50 MG tablet Take 50 mg by mouth at bedtime.     Allergies:   Iodinated diagnostic agents   Social History   Socioeconomic History  . Marital status: Married    Spouse name: Not on file  . Number of children: Not on file  . Years of education: Not on file  . Highest education level: Not on file  Occupational History  . Occupation: [employer]  Tobacco Use  . Smoking status: Former Smoker    Packs/day: 1.50    Years: 42.00    Pack years: 63.00    Quit date: 2005    Years since quitting: 17.1  . Smokeless  tobacco: Never Used  Vaping Use  . Vaping Use: Never used  Substance and Sexual Activity  . Alcohol use: Yes    Alcohol/week: 2.0 standard drinks    Types: 2 Cans of beer per week    Comment: mostly daily  . Drug use: Never    Comment: CBD  . Sexual activity: Not on file  Other Topics Concern  . Not on file  Social History Narrative  . Not on file   Social Determinants of Health   Financial Resource Strain: Not on file  Food Insecurity: No Food Insecurity  . Worried About RuCharity fundraisern the Last Year: Never true  . Ran  Out of Food in the Last Year: Never true  Transportation Needs: No Transportation Needs  . Lack of Transportation (Medical): No  . Lack of Transportation (Non-Medical): No  Physical Activity: Not on file  Stress: Not on file  Social Connections: Not on file     Family History: The patient's family history includes Cancer in his brother and mother. There is no history of Heart disease or Stroke.   ROS:   Please see the history of present illness.    All other systems reviewed and are negative.   EKGs/Labs/Other Studies Reviewed:    The following studies were reviewed today:  Echo 11/2020 1. Left ventricular ejection fraction, by estimation, is 40 to 45%. The  left ventricle has mildly decreased function. The left ventricle  demonstrates regional wall motion abnormalities (see scoring  diagram/findings for description). There is mild left  ventricular hypertrophy. Left ventricular diastolic parameters are  consistent with Grade I diastolic dysfunction (impaired relaxation).  Elevated left atrial pressure.  2. Right ventricular systolic function is normal. The right ventricular  size is normal.  3. The mitral valve is normal in structure. No evidence of mitral valve  regurgitation. No evidence of mitral stenosis.  4. The aortic valve is normal in structure. Aortic valve regurgitation is  not visualized. Mild to moderate aortic valve  sclerosis/calcification is  present, without any evidence of aortic stenosis.  5. The inferior vena cava is normal in size with greater than 50%  respiratory variability, suggesting right atrial pressure of 3 mmHg.   EKG:  EKG is ordered today.  The ekg ordered today demonstrates sinus rhythm at rate of 80 bpm, right bundle branch block  Recent Labs: 11/13/2020: TSH 1.931 11/14/2020: B Natriuretic Peptide 859.3 11/16/2020: ALT 69 11/22/2020: Magnesium 2.6 11/25/2020: Hemoglobin 8.6; Platelets 332 11/27/2020: BUN 33; Creatinine, Ser 1.07; Potassium 4.2; Sodium 134  Recent Lipid Panel    Component Value Date/Time   CHOL 198 11/15/2020 0253   TRIG 433 (H) 11/15/2020 0253   HDL 11 (L) 11/15/2020 0253   CHOLHDL 18.0 11/15/2020 0253   VLDL UNABLE TO CALCULATE IF TRIGLYCERIDE OVER 400 mg/dL 11/15/2020 0253   LDLCALC UNABLE TO CALCULATE IF TRIGLYCERIDE OVER 400 mg/dL 11/15/2020 0253   LDLDIRECT 64.5 11/15/2020 0253    Physical Exam:    VS:  BP (!) 90/50   Pulse 80   Ht 5' 8"  (1.727 m)   Wt 208 lb 12.8 oz (94.7 kg)   SpO2 93%   BMI 31.75 kg/m     Wt Readings from Last 3 Encounters:  12/16/20 208 lb 12.8 oz (94.7 kg)  12/15/20 209 lb (94.8 kg)  11/27/20 223 lb 6.4 oz (101.3 kg)     GEN:  Well nourished, well developed in no acute distress HEENT: Normal NECK: No JVD; No carotid bruits LYMPHATICS: No lymphadenopathy CARDIAC: RRR, no murmurs, rubs, gallops.  Right lower extremity with minimal erythema RESPIRATORY:  Clear to auscultation without rales, wheezing or rhonchi  ABDOMEN: Soft, non-tender, non-distended MUSCULOSKELETAL:  No edema; No deformity  SKIN: Warm and dry NEUROLOGIC:  Alert and oriented x 3 PSYCHIATRIC:  Normal affect   ASSESSMENT AND PLAN:    1. CAD s/p CABG No angina.  Continue aspirin, statin and beta-blocker.  2.  Ischemic cardiomyopathy LV function of 40 to 45% by echocardiogram.  He appears euvolemic.  Continue Lasix, Coreg and Telmisartan at current  dose.  See below.  3.  Hypertension Noted hypotensive today.  His blood pressure was normal  yesterday at Dr. Orvan Seen office.  He is going to get his blood pressure cuff tomorrow.  I have recommended to keep track of his blood pressure before taking medication.  Advised to hold Coreg if systolic blood pressure less than 90.  4.  Hyperlipidemia - 11/15/2020: Cholesterol 198; HDL 11; LDL Cholesterol UNABLE TO CALCULATE IF TRIGLYCERIDE OVER 400 mg/dL; Triglycerides 433; VLDL UNABLE TO CALCULATE IF TRIGLYCERIDE OVER 400 mg/dL  -Continue Lipitor 80 mg and Zetia 10 mg daily -Repeat labs in 6 weeks  Patient will continue his care with  [facility]til[facility] at [facility].  Medication Adjustments/Labs and Tests Ordered: Current medicines are reviewed at length with the patient today.  Concerns regarding medicines are outlined above.  Orders Placed This Encounter  Procedures  . EKG 12-Lead   No orders of the defined types were placed in this encounter.   Patient Instructions  Medication Instructions:  Your physician recommends that you continue on your current medications as directed. Please refer to the Current Medication list given to you today.  *If you need a refill on your cardiac medications before your next appointment, please call your pharmacy*   Lab Work: None ordered  If you have labs (blood work) drawn today and your tests are completely normal, you will receive your results only by: Marland Kitchen MyChart Message (if you have MyChart) OR . A paper copy in the mail If you have any lab test that is abnormal or we need to change your treatment, we will call you to review the results.   Testing/Procedures: None ordered   Follow-Up: At [facility], you and your health needs are our priority.  As part of our continuing mission to provide you with exceptional heart care, we have created designated Provider Care Teams.  These Care Teams include your primary Cardiologist (physician) and Advanced Practice  Providers (APPs -  Physician Assistants and Nurse Practitioners) who all work together to provide you with the care you need, when you need it.  We recommend signing up for the patient portal called "MyChart".  Sign up information is provided on this After Visit Summary.  MyChart is used to connect with patients for Virtual Visits (Telemedicine).  Patients are able to view lab/test results, encounter notes, upcoming appointments, etc.  Non-urgent messages can be sent to your provider as well.   To learn more about what you can do with MyChart, go to [email or web address].    Your next appointment:   As needed           Jarrett Soho, [place]  12/16/2020 9:43 AM    [place]

## 2020-12-16 NOTE — Patient Instructions (Signed)
Medication Instructions:  °Your physician recommends that you continue on your current medications as directed. Please refer to the Current Medication list given to you today. ° °*If you need a refill on your cardiac medications before your next appointment, please call your pharmacy* ° ° °Lab Work: °None ordered  °If you have labs (blood work) drawn today and your tests are completely normal, you will receive your results only by: °• MyChart Message (if you have MyChart) OR °• A paper copy in the mail °If you have any lab test that is abnormal or we need to change your treatment, we will call you to review the results. ° ° °Testing/Procedures: °None ordered  ° ° °Follow-Up: °At [facility], you and your health needs are our priority.  As part of our continuing mission to provide you with exceptional heart care, we have created designated Provider Care Teams.  These Care Teams include your primary Cardiologist (physician) and Advanced Practice Providers (APPs -  Physician Assistants and Nurse Practitioners) who all work together to provide you with the care you need, when you need it. ° °We recommend signing up for the patient portal called "MyChart".  Sign up information is provided on this After Visit Summary.  MyChart is used to connect with patients for Virtual Visits (Telemedicine).  Patients are able to view lab/test results, encounter notes, upcoming appointments, etc.  Non-urgent messages can be sent to your provider as well.   °To learn more about what you can do with MyChart, go to [email or web address].   ° °Your next appointment:   °As needed °

## 2020-12-17 DIAGNOSIS — I429 Cardiomyopathy, unspecified: Secondary | ICD-10-CM | POA: Diagnosis not present

## 2020-12-17 DIAGNOSIS — Z951 Presence of aortocoronary bypass graft: Secondary | ICD-10-CM | POA: Diagnosis not present

## 2020-12-17 DIAGNOSIS — I2510 Atherosclerotic heart disease of native coronary artery without angina pectoris: Secondary | ICD-10-CM | POA: Diagnosis not present

## 2020-12-17 DIAGNOSIS — I252 Old myocardial infarction: Secondary | ICD-10-CM | POA: Diagnosis not present

## 2020-12-17 DIAGNOSIS — E782 Mixed hyperlipidemia: Secondary | ICD-10-CM | POA: Diagnosis not present

## 2020-12-17 DIAGNOSIS — G4733 Obstructive sleep apnea (adult) (pediatric): Secondary | ICD-10-CM | POA: Diagnosis not present

## 2020-12-17 DIAGNOSIS — E669 Obesity, unspecified: Secondary | ICD-10-CM | POA: Diagnosis not present

## 2020-12-17 DIAGNOSIS — I110 Hypertensive heart disease with heart failure: Secondary | ICD-10-CM | POA: Diagnosis not present

## 2020-12-17 DIAGNOSIS — I509 Heart failure, unspecified: Secondary | ICD-10-CM | POA: Diagnosis not present

## 2020-12-22 DIAGNOSIS — E10649 Type 1 diabetes mellitus with hypoglycemia without coma: Secondary | ICD-10-CM | POA: Diagnosis not present

## 2020-12-24 DIAGNOSIS — E785 Hyperlipidemia, unspecified: Secondary | ICD-10-CM | POA: Diagnosis not present

## 2020-12-24 DIAGNOSIS — R41841 Cognitive communication deficit: Secondary | ICD-10-CM | POA: Diagnosis not present

## 2020-12-24 DIAGNOSIS — E1042 Type 1 diabetes mellitus with diabetic polyneuropathy: Secondary | ICD-10-CM | POA: Diagnosis not present

## 2020-12-24 DIAGNOSIS — E10649 Type 1 diabetes mellitus with hypoglycemia without coma: Secondary | ICD-10-CM | POA: Diagnosis not present

## 2020-12-24 DIAGNOSIS — D649 Anemia, unspecified: Secondary | ICD-10-CM | POA: Diagnosis not present

## 2020-12-24 DIAGNOSIS — I2510 Atherosclerotic heart disease of native coronary artery without angina pectoris: Secondary | ICD-10-CM | POA: Diagnosis not present

## 2020-12-24 DIAGNOSIS — Z951 Presence of aortocoronary bypass graft: Secondary | ICD-10-CM | POA: Diagnosis not present

## 2020-12-24 DIAGNOSIS — J9601 Acute respiratory failure with hypoxia: Secondary | ICD-10-CM | POA: Diagnosis not present

## 2020-12-24 DIAGNOSIS — I429 Cardiomyopathy, unspecified: Secondary | ICD-10-CM | POA: Diagnosis not present

## 2020-12-24 DIAGNOSIS — Z794 Long term (current) use of insulin: Secondary | ICD-10-CM | POA: Diagnosis not present

## 2020-12-24 DIAGNOSIS — F0390 Unspecified dementia without behavioral disturbance: Secondary | ICD-10-CM | POA: Diagnosis not present

## 2020-12-24 DIAGNOSIS — I5043 Acute on chronic combined systolic (congestive) and diastolic (congestive) heart failure: Secondary | ICD-10-CM | POA: Diagnosis not present

## 2020-12-24 DIAGNOSIS — I10 Essential (primary) hypertension: Secondary | ICD-10-CM | POA: Diagnosis not present

## 2020-12-24 DIAGNOSIS — Z48812 Encounter for surgical aftercare following surgery on the circulatory system: Secondary | ICD-10-CM | POA: Diagnosis not present

## 2020-12-24 DIAGNOSIS — Z6833 Body mass index (BMI) 33.0-33.9, adult: Secondary | ICD-10-CM | POA: Diagnosis not present

## 2020-12-24 DIAGNOSIS — I110 Hypertensive heart disease with heart failure: Secondary | ICD-10-CM | POA: Diagnosis not present

## 2020-12-24 DIAGNOSIS — L03115 Cellulitis of right lower limb: Secondary | ICD-10-CM | POA: Diagnosis not present

## 2020-12-24 DIAGNOSIS — F39 Unspecified mood [affective] disorder: Secondary | ICD-10-CM | POA: Diagnosis not present

## 2020-12-25 DIAGNOSIS — E785 Hyperlipidemia, unspecified: Secondary | ICD-10-CM | POA: Diagnosis not present

## 2020-12-25 DIAGNOSIS — I10 Essential (primary) hypertension: Secondary | ICD-10-CM | POA: Diagnosis not present

## 2020-12-25 DIAGNOSIS — E10649 Type 1 diabetes mellitus with hypoglycemia without coma: Secondary | ICD-10-CM | POA: Diagnosis not present

## 2020-12-25 DIAGNOSIS — Z951 Presence of aortocoronary bypass graft: Secondary | ICD-10-CM | POA: Diagnosis not present

## 2021-01-01 DIAGNOSIS — F39 Unspecified mood [affective] disorder: Secondary | ICD-10-CM | POA: Diagnosis not present

## 2021-01-01 DIAGNOSIS — Z79899 Other long term (current) drug therapy: Secondary | ICD-10-CM | POA: Diagnosis not present

## 2021-01-01 DIAGNOSIS — F909 Attention-deficit hyperactivity disorder, unspecified type: Secondary | ICD-10-CM | POA: Diagnosis not present

## 2021-01-06 DIAGNOSIS — I10 Essential (primary) hypertension: Secondary | ICD-10-CM | POA: Diagnosis not present

## 2021-01-06 DIAGNOSIS — Z951 Presence of aortocoronary bypass graft: Secondary | ICD-10-CM | POA: Diagnosis not present

## 2021-01-06 DIAGNOSIS — G4733 Obstructive sleep apnea (adult) (pediatric): Secondary | ICD-10-CM | POA: Diagnosis not present

## 2021-01-06 DIAGNOSIS — E669 Obesity, unspecified: Secondary | ICD-10-CM | POA: Diagnosis not present

## 2021-01-06 DIAGNOSIS — E10649 Type 1 diabetes mellitus with hypoglycemia without coma: Secondary | ICD-10-CM | POA: Diagnosis not present

## 2021-01-06 DIAGNOSIS — I2510 Atherosclerotic heart disease of native coronary artery without angina pectoris: Secondary | ICD-10-CM | POA: Diagnosis not present

## 2021-01-06 DIAGNOSIS — T7840XA Allergy, unspecified, initial encounter: Secondary | ICD-10-CM | POA: Diagnosis not present

## 2021-01-06 DIAGNOSIS — E785 Hyperlipidemia, unspecified: Secondary | ICD-10-CM | POA: Diagnosis not present

## 2021-01-06 DIAGNOSIS — Z794 Long term (current) use of insulin: Secondary | ICD-10-CM | POA: Diagnosis not present

## 2021-01-06 DIAGNOSIS — L03115 Cellulitis of right lower limb: Secondary | ICD-10-CM | POA: Diagnosis not present

## 2021-01-06 DIAGNOSIS — Z955 Presence of coronary angioplasty implant and graft: Secondary | ICD-10-CM | POA: Diagnosis not present

## 2021-01-06 DIAGNOSIS — Z09 Encounter for follow-up examination after completed treatment for conditions other than malignant neoplasm: Secondary | ICD-10-CM | POA: Diagnosis not present

## 2021-01-06 DIAGNOSIS — I252 Old myocardial infarction: Secondary | ICD-10-CM | POA: Diagnosis not present

## 2021-01-13 DIAGNOSIS — I509 Heart failure, unspecified: Secondary | ICD-10-CM | POA: Diagnosis not present

## 2021-01-13 DIAGNOSIS — R600 Localized edema: Secondary | ICD-10-CM | POA: Diagnosis not present

## 2021-01-13 DIAGNOSIS — L03115 Cellulitis of right lower limb: Secondary | ICD-10-CM | POA: Diagnosis not present

## 2021-01-16 DIAGNOSIS — G4733 Obstructive sleep apnea (adult) (pediatric): Secondary | ICD-10-CM | POA: Diagnosis not present

## 2021-01-21 DIAGNOSIS — E10649 Type 1 diabetes mellitus with hypoglycemia without coma: Secondary | ICD-10-CM | POA: Diagnosis not present

## 2021-01-22 DIAGNOSIS — G479 Sleep disorder, unspecified: Secondary | ICD-10-CM | POA: Diagnosis not present

## 2021-01-22 DIAGNOSIS — E10649 Type 1 diabetes mellitus with hypoglycemia without coma: Secondary | ICD-10-CM | POA: Diagnosis not present

## 2021-01-22 DIAGNOSIS — R269 Unspecified abnormalities of gait and mobility: Secondary | ICD-10-CM | POA: Diagnosis not present

## 2021-01-22 DIAGNOSIS — E1142 Type 2 diabetes mellitus with diabetic polyneuropathy: Secondary | ICD-10-CM | POA: Diagnosis not present

## 2021-01-22 DIAGNOSIS — Z9989 Dependence on other enabling machines and devices: Secondary | ICD-10-CM | POA: Diagnosis not present

## 2021-01-22 DIAGNOSIS — G4733 Obstructive sleep apnea (adult) (pediatric): Secondary | ICD-10-CM | POA: Diagnosis not present

## 2021-01-22 DIAGNOSIS — R419 Unspecified symptoms and signs involving cognitive functions and awareness: Secondary | ICD-10-CM | POA: Diagnosis not present

## 2021-01-27 DIAGNOSIS — I87391 Chronic venous hypertension (idiopathic) with other complications of right lower extremity: Secondary | ICD-10-CM | POA: Diagnosis not present

## 2021-01-27 DIAGNOSIS — I872 Venous insufficiency (chronic) (peripheral): Secondary | ICD-10-CM | POA: Diagnosis not present

## 2021-02-03 ENCOUNTER — Ambulatory Visit: Payer: HMO

## 2021-02-09 DIAGNOSIS — E10649 Type 1 diabetes mellitus with hypoglycemia without coma: Secondary | ICD-10-CM | POA: Diagnosis not present

## 2021-03-04 DIAGNOSIS — E109 Type 1 diabetes mellitus without complications: Secondary | ICD-10-CM | POA: Diagnosis not present

## 2021-03-04 DIAGNOSIS — J301 Allergic rhinitis due to pollen: Secondary | ICD-10-CM | POA: Diagnosis not present

## 2021-03-05 DIAGNOSIS — Z6834 Body mass index (BMI) 34.0-34.9, adult: Secondary | ICD-10-CM | POA: Diagnosis not present

## 2021-03-05 DIAGNOSIS — I890 Lymphedema, not elsewhere classified: Secondary | ICD-10-CM | POA: Diagnosis not present

## 2021-03-05 DIAGNOSIS — R600 Localized edema: Secondary | ICD-10-CM | POA: Diagnosis not present

## 2021-03-05 DIAGNOSIS — I87321 Chronic venous hypertension (idiopathic) with inflammation of right lower extremity: Secondary | ICD-10-CM | POA: Diagnosis not present

## 2021-03-05 DIAGNOSIS — E669 Obesity, unspecified: Secondary | ICD-10-CM | POA: Diagnosis not present

## 2021-03-12 DIAGNOSIS — E109 Type 1 diabetes mellitus without complications: Secondary | ICD-10-CM | POA: Diagnosis not present

## 2021-03-12 DIAGNOSIS — L03012 Cellulitis of left finger: Secondary | ICD-10-CM | POA: Diagnosis not present

## 2021-03-12 DIAGNOSIS — E785 Hyperlipidemia, unspecified: Secondary | ICD-10-CM | POA: Diagnosis not present

## 2021-03-12 DIAGNOSIS — E669 Obesity, unspecified: Secondary | ICD-10-CM | POA: Diagnosis not present

## 2021-03-12 DIAGNOSIS — Z6834 Body mass index (BMI) 34.0-34.9, adult: Secondary | ICD-10-CM | POA: Diagnosis not present

## 2021-03-12 DIAGNOSIS — I10 Essential (primary) hypertension: Secondary | ICD-10-CM | POA: Diagnosis not present

## 2021-03-12 DIAGNOSIS — L03115 Cellulitis of right lower limb: Secondary | ICD-10-CM | POA: Diagnosis not present

## 2021-03-16 DIAGNOSIS — E785 Hyperlipidemia, unspecified: Secondary | ICD-10-CM | POA: Diagnosis not present

## 2021-03-16 DIAGNOSIS — E109 Type 1 diabetes mellitus without complications: Secondary | ICD-10-CM | POA: Diagnosis not present

## 2021-03-16 DIAGNOSIS — I10 Essential (primary) hypertension: Secondary | ICD-10-CM | POA: Diagnosis not present

## 2021-03-16 DIAGNOSIS — L02512 Cutaneous abscess of left hand: Secondary | ICD-10-CM | POA: Diagnosis not present

## 2021-03-18 DIAGNOSIS — E10649 Type 1 diabetes mellitus with hypoglycemia without coma: Secondary | ICD-10-CM | POA: Diagnosis not present

## 2021-03-19 DIAGNOSIS — E10649 Type 1 diabetes mellitus with hypoglycemia without coma: Secondary | ICD-10-CM | POA: Diagnosis not present

## 2021-03-23 DIAGNOSIS — Z9989 Dependence on other enabling machines and devices: Secondary | ICD-10-CM | POA: Diagnosis not present

## 2021-03-23 DIAGNOSIS — G4733 Obstructive sleep apnea (adult) (pediatric): Secondary | ICD-10-CM | POA: Diagnosis not present

## 2021-03-25 DIAGNOSIS — E10649 Type 1 diabetes mellitus with hypoglycemia without coma: Secondary | ICD-10-CM | POA: Diagnosis not present

## 2021-03-25 DIAGNOSIS — E1065 Type 1 diabetes mellitus with hyperglycemia: Secondary | ICD-10-CM | POA: Diagnosis not present

## 2021-03-26 DIAGNOSIS — F39 Unspecified mood [affective] disorder: Secondary | ICD-10-CM | POA: Diagnosis not present

## 2021-03-26 DIAGNOSIS — Z79899 Other long term (current) drug therapy: Secondary | ICD-10-CM | POA: Diagnosis not present

## 2021-03-26 DIAGNOSIS — F909 Attention-deficit hyperactivity disorder, unspecified type: Secondary | ICD-10-CM | POA: Diagnosis not present

## 2021-04-17 DIAGNOSIS — E10649 Type 1 diabetes mellitus with hypoglycemia without coma: Secondary | ICD-10-CM | POA: Diagnosis not present

## 2021-04-20 DIAGNOSIS — E10649 Type 1 diabetes mellitus with hypoglycemia without coma: Secondary | ICD-10-CM | POA: Diagnosis not present

## 2021-04-22 DIAGNOSIS — G4733 Obstructive sleep apnea (adult) (pediatric): Secondary | ICD-10-CM | POA: Diagnosis not present

## 2021-04-22 DIAGNOSIS — Z9989 Dependence on other enabling machines and devices: Secondary | ICD-10-CM | POA: Diagnosis not present

## 2021-04-28 DIAGNOSIS — Z85828 Personal history of other malignant neoplasm of skin: Secondary | ICD-10-CM | POA: Diagnosis not present

## 2021-04-28 DIAGNOSIS — L570 Actinic keratosis: Secondary | ICD-10-CM | POA: Diagnosis not present

## 2021-04-28 DIAGNOSIS — I872 Venous insufficiency (chronic) (peripheral): Secondary | ICD-10-CM | POA: Diagnosis not present

## 2021-04-28 DIAGNOSIS — G4733 Obstructive sleep apnea (adult) (pediatric): Secondary | ICD-10-CM | POA: Diagnosis not present

## 2021-04-28 DIAGNOSIS — I87391 Chronic venous hypertension (idiopathic) with other complications of right lower extremity: Secondary | ICD-10-CM | POA: Diagnosis not present

## 2021-05-07 DIAGNOSIS — I509 Heart failure, unspecified: Secondary | ICD-10-CM | POA: Diagnosis not present

## 2021-05-07 DIAGNOSIS — Z794 Long term (current) use of insulin: Secondary | ICD-10-CM | POA: Diagnosis not present

## 2021-05-07 DIAGNOSIS — Z951 Presence of aortocoronary bypass graft: Secondary | ICD-10-CM | POA: Diagnosis not present

## 2021-05-07 DIAGNOSIS — I2510 Atherosclerotic heart disease of native coronary artery without angina pectoris: Secondary | ICD-10-CM | POA: Diagnosis not present

## 2021-05-07 DIAGNOSIS — I10 Essential (primary) hypertension: Secondary | ICD-10-CM | POA: Diagnosis not present

## 2021-05-07 DIAGNOSIS — I429 Cardiomyopathy, unspecified: Secondary | ICD-10-CM | POA: Diagnosis not present

## 2021-05-07 DIAGNOSIS — E785 Hyperlipidemia, unspecified: Secondary | ICD-10-CM | POA: Diagnosis not present

## 2021-05-07 DIAGNOSIS — I110 Hypertensive heart disease with heart failure: Secondary | ICD-10-CM | POA: Diagnosis not present

## 2021-05-07 DIAGNOSIS — E1065 Type 1 diabetes mellitus with hyperglycemia: Secondary | ICD-10-CM | POA: Diagnosis not present

## 2021-05-18 DIAGNOSIS — E10649 Type 1 diabetes mellitus with hypoglycemia without coma: Secondary | ICD-10-CM | POA: Diagnosis not present

## 2021-05-20 DIAGNOSIS — E10649 Type 1 diabetes mellitus with hypoglycemia without coma: Secondary | ICD-10-CM | POA: Diagnosis not present

## 2021-05-21 DIAGNOSIS — I872 Venous insufficiency (chronic) (peripheral): Secondary | ICD-10-CM | POA: Diagnosis not present

## 2021-05-21 DIAGNOSIS — I890 Lymphedema, not elsewhere classified: Secondary | ICD-10-CM | POA: Diagnosis not present

## 2021-05-23 DIAGNOSIS — Z9989 Dependence on other enabling machines and devices: Secondary | ICD-10-CM | POA: Diagnosis not present

## 2021-05-23 DIAGNOSIS — G4733 Obstructive sleep apnea (adult) (pediatric): Secondary | ICD-10-CM | POA: Diagnosis not present

## 2021-05-26 ENCOUNTER — Other Ambulatory Visit: Payer: Self-pay | Admitting: Physician Assistant

## 2021-05-29 DIAGNOSIS — I890 Lymphedema, not elsewhere classified: Secondary | ICD-10-CM | POA: Diagnosis not present

## 2021-05-29 DIAGNOSIS — R600 Localized edema: Secondary | ICD-10-CM | POA: Diagnosis not present

## 2021-06-16 DIAGNOSIS — E10649 Type 1 diabetes mellitus with hypoglycemia without coma: Secondary | ICD-10-CM | POA: Diagnosis not present

## 2021-06-18 DIAGNOSIS — E10649 Type 1 diabetes mellitus with hypoglycemia without coma: Secondary | ICD-10-CM | POA: Diagnosis not present

## 2021-06-19 DIAGNOSIS — E10649 Type 1 diabetes mellitus with hypoglycemia without coma: Secondary | ICD-10-CM | POA: Diagnosis not present

## 2021-06-23 DIAGNOSIS — Z9989 Dependence on other enabling machines and devices: Secondary | ICD-10-CM | POA: Diagnosis not present

## 2021-06-23 DIAGNOSIS — G4733 Obstructive sleep apnea (adult) (pediatric): Secondary | ICD-10-CM | POA: Diagnosis not present

## 2021-06-27 DIAGNOSIS — L03115 Cellulitis of right lower limb: Secondary | ICD-10-CM | POA: Diagnosis not present

## 2021-06-27 DIAGNOSIS — L738 Other specified follicular disorders: Secondary | ICD-10-CM | POA: Diagnosis not present

## 2021-06-27 DIAGNOSIS — S80811A Abrasion, right lower leg, initial encounter: Secondary | ICD-10-CM | POA: Diagnosis not present

## 2021-07-08 DIAGNOSIS — E669 Obesity, unspecified: Secondary | ICD-10-CM | POA: Diagnosis not present

## 2021-07-08 DIAGNOSIS — R809 Proteinuria, unspecified: Secondary | ICD-10-CM | POA: Diagnosis not present

## 2021-07-08 DIAGNOSIS — E1029 Type 1 diabetes mellitus with other diabetic kidney complication: Secondary | ICD-10-CM | POA: Diagnosis not present

## 2021-07-17 DIAGNOSIS — I252 Old myocardial infarction: Secondary | ICD-10-CM | POA: Diagnosis not present

## 2021-07-17 DIAGNOSIS — I10 Essential (primary) hypertension: Secondary | ICD-10-CM | POA: Diagnosis not present

## 2021-07-17 DIAGNOSIS — E7800 Pure hypercholesterolemia, unspecified: Secondary | ICD-10-CM | POA: Diagnosis not present

## 2021-07-17 DIAGNOSIS — Z951 Presence of aortocoronary bypass graft: Secondary | ICD-10-CM | POA: Diagnosis not present

## 2021-07-17 DIAGNOSIS — I2510 Atherosclerotic heart disease of native coronary artery without angina pectoris: Secondary | ICD-10-CM | POA: Diagnosis not present

## 2021-07-18 DIAGNOSIS — E10649 Type 1 diabetes mellitus with hypoglycemia without coma: Secondary | ICD-10-CM | POA: Diagnosis not present

## 2021-07-20 DIAGNOSIS — E10649 Type 1 diabetes mellitus with hypoglycemia without coma: Secondary | ICD-10-CM | POA: Diagnosis not present

## 2021-07-23 DIAGNOSIS — G4733 Obstructive sleep apnea (adult) (pediatric): Secondary | ICD-10-CM | POA: Diagnosis not present

## 2021-07-23 DIAGNOSIS — Z9989 Dependence on other enabling machines and devices: Secondary | ICD-10-CM | POA: Diagnosis not present

## 2021-07-27 DIAGNOSIS — E1065 Type 1 diabetes mellitus with hyperglycemia: Secondary | ICD-10-CM | POA: Diagnosis not present

## 2021-07-27 DIAGNOSIS — J301 Allergic rhinitis due to pollen: Secondary | ICD-10-CM | POA: Diagnosis not present

## 2021-07-27 DIAGNOSIS — D649 Anemia, unspecified: Secondary | ICD-10-CM | POA: Diagnosis not present

## 2021-07-27 DIAGNOSIS — I10 Essential (primary) hypertension: Secondary | ICD-10-CM | POA: Diagnosis not present

## 2021-07-27 DIAGNOSIS — J310 Chronic rhinitis: Secondary | ICD-10-CM | POA: Diagnosis not present

## 2021-07-28 DIAGNOSIS — G4733 Obstructive sleep apnea (adult) (pediatric): Secondary | ICD-10-CM | POA: Diagnosis not present

## 2021-08-18 DIAGNOSIS — G4733 Obstructive sleep apnea (adult) (pediatric): Secondary | ICD-10-CM | POA: Diagnosis not present

## 2021-08-18 DIAGNOSIS — Z9989 Dependence on other enabling machines and devices: Secondary | ICD-10-CM | POA: Diagnosis not present

## 2021-08-18 DIAGNOSIS — R419 Unspecified symptoms and signs involving cognitive functions and awareness: Secondary | ICD-10-CM | POA: Diagnosis not present

## 2021-08-18 DIAGNOSIS — E1142 Type 2 diabetes mellitus with diabetic polyneuropathy: Secondary | ICD-10-CM | POA: Diagnosis not present

## 2021-08-23 DIAGNOSIS — Z9989 Dependence on other enabling machines and devices: Secondary | ICD-10-CM | POA: Diagnosis not present

## 2021-08-23 DIAGNOSIS — G4733 Obstructive sleep apnea (adult) (pediatric): Secondary | ICD-10-CM | POA: Diagnosis not present

## 2021-08-25 DIAGNOSIS — E119 Type 2 diabetes mellitus without complications: Secondary | ICD-10-CM | POA: Diagnosis not present

## 2021-08-25 DIAGNOSIS — H5212 Myopia, left eye: Secondary | ICD-10-CM | POA: Diagnosis not present

## 2021-08-25 DIAGNOSIS — H524 Presbyopia: Secondary | ICD-10-CM | POA: Diagnosis not present

## 2021-08-25 DIAGNOSIS — H4302 Vitreous prolapse, left eye: Secondary | ICD-10-CM | POA: Diagnosis not present

## 2021-08-25 DIAGNOSIS — H40011 Open angle with borderline findings, low risk, right eye: Secondary | ICD-10-CM | POA: Diagnosis not present

## 2021-08-28 DIAGNOSIS — E10649 Type 1 diabetes mellitus with hypoglycemia without coma: Secondary | ICD-10-CM | POA: Diagnosis not present

## 2021-09-11 DIAGNOSIS — H4301 Vitreous prolapse, right eye: Secondary | ICD-10-CM | POA: Diagnosis not present

## 2021-09-22 DIAGNOSIS — E10649 Type 1 diabetes mellitus with hypoglycemia without coma: Secondary | ICD-10-CM | POA: Diagnosis not present

## 2021-09-22 DIAGNOSIS — E1065 Type 1 diabetes mellitus with hyperglycemia: Secondary | ICD-10-CM | POA: Diagnosis not present

## 2021-09-24 DIAGNOSIS — F39 Unspecified mood [affective] disorder: Secondary | ICD-10-CM | POA: Diagnosis not present

## 2021-09-24 DIAGNOSIS — Z79899 Other long term (current) drug therapy: Secondary | ICD-10-CM | POA: Diagnosis not present

## 2021-09-24 DIAGNOSIS — F909 Attention-deficit hyperactivity disorder, unspecified type: Secondary | ICD-10-CM | POA: Diagnosis not present

## 2021-10-01 DIAGNOSIS — E10649 Type 1 diabetes mellitus with hypoglycemia without coma: Secondary | ICD-10-CM | POA: Diagnosis not present

## 2022-08-17 IMAGING — DX DG CHEST 1V PORT
1 series · 1 of 1 positions shown · non-contrast
Comparison: November 12, 2020.

CLINICAL DATA: Shortness of breath, bilateral lower extremity
swelling.

EXAM:
PORTABLE CHEST 1 VIEW

[chest]
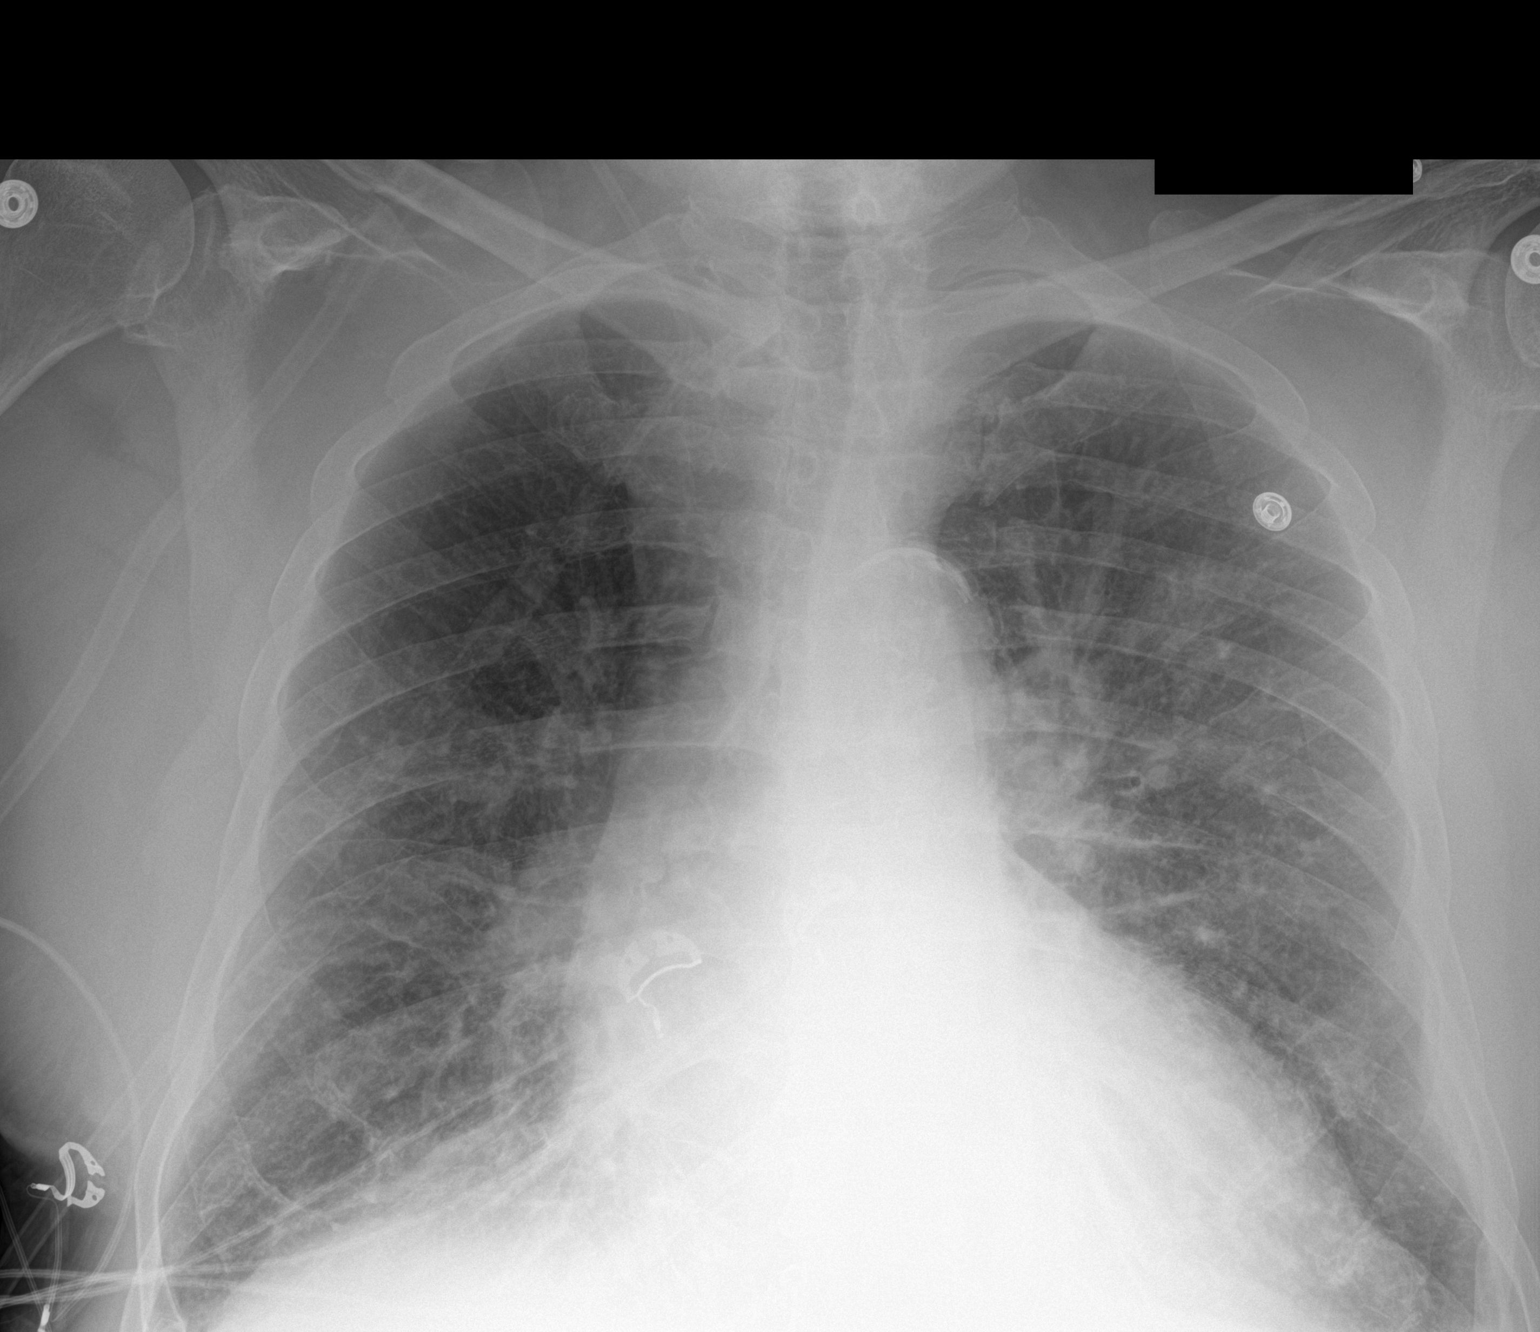

[1 of 1 positions shown; findings below may reference images not displayed]

FINDINGS: Stable cardiomegaly. No pneumothorax is noted. Mild perihilar and
basilar interstitial densities are noted concerning for edema or
possibly scarring. No pleural effusion is noted. Bony thorax is
unremarkable.
IMPRESSION: Mild perihilar and basilar interstitial densities are noted
concerning for edema or possibly scarring.

Aortic Atherosclerosis ([id number]).

## 2022-08-23 IMAGING — CT CT TIBIA FIBULA *R* W/O CM
3 series · 9 of 33 positions shown, 10 images · non-contrast
Comparison: None.

CLINICAL DATA: Lower leg soft tissue infection.

EXAM:
CT OF THE LOWER RIGHT EXTREMITY WITHOUT CONTRAST
TECHNIQUE: Multidetector CT imaging of the right lower extremity was performed
according to the standard protocol.

[Series 4: lower ext 1.5 st · axial · 0.52mm/px · z∈[+522,+522]mm · 1 of 367 slices shown, 2 images]
[im 198/367  soft-tissue]
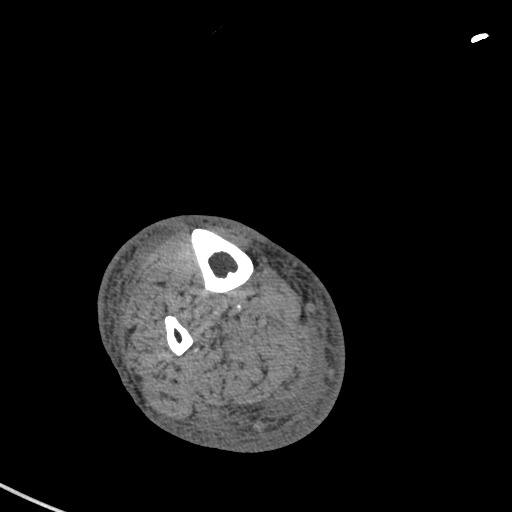
[im 198/367  bone]
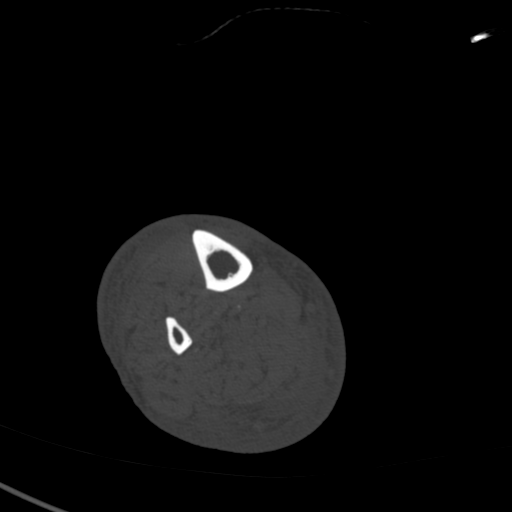

[Series 9: lower ext cor st · coronal · 0.49mm/px · 3 of 160 slices shown]
[im 32/160  bone]
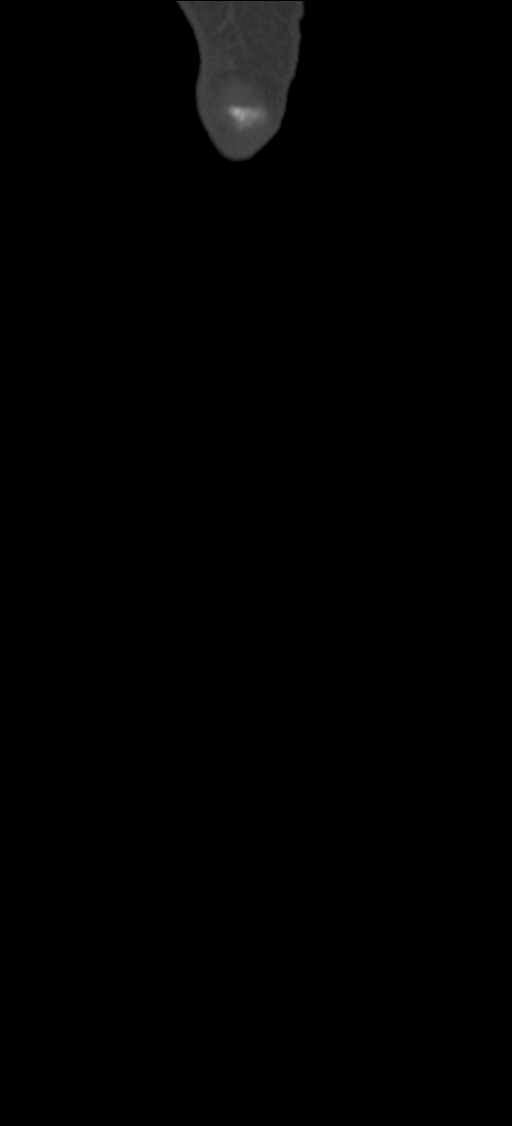
[im 64/160  bone]
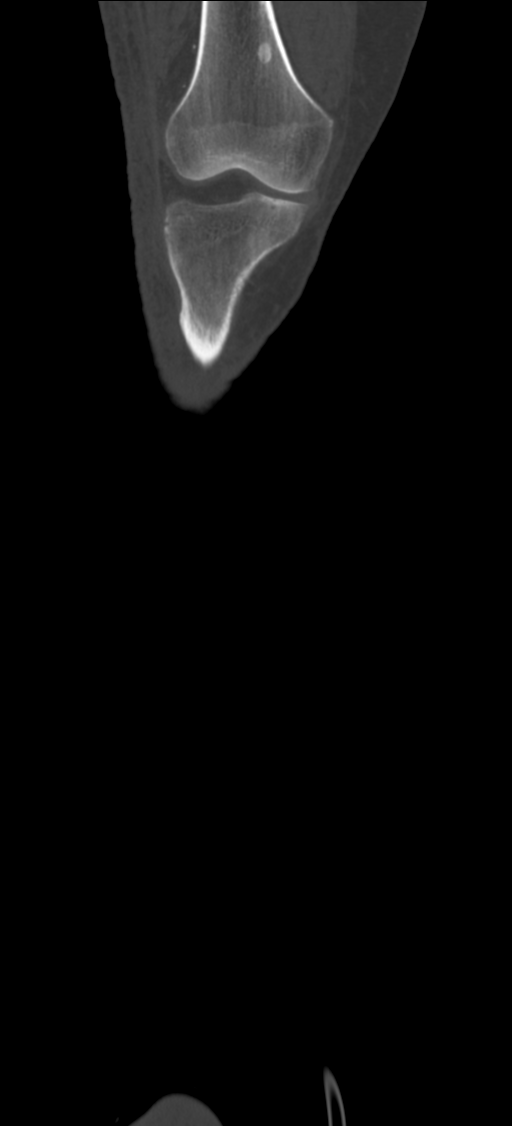
[im 96/160  bone]
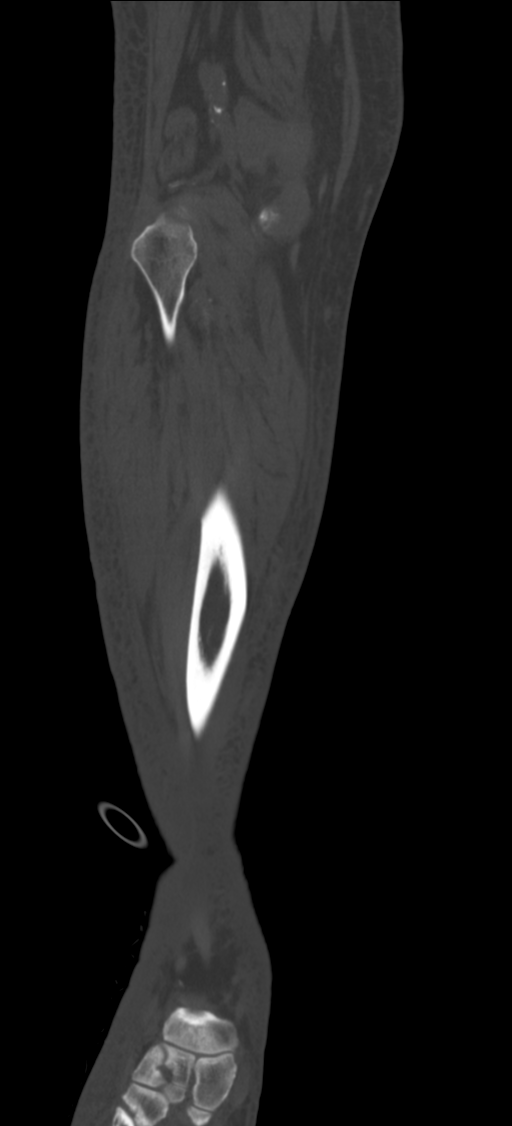

[Series 10: lower ext sag st · sagittal · 0.49mm/px · 5 of 167 slices shown]
[im 56/167  bone]
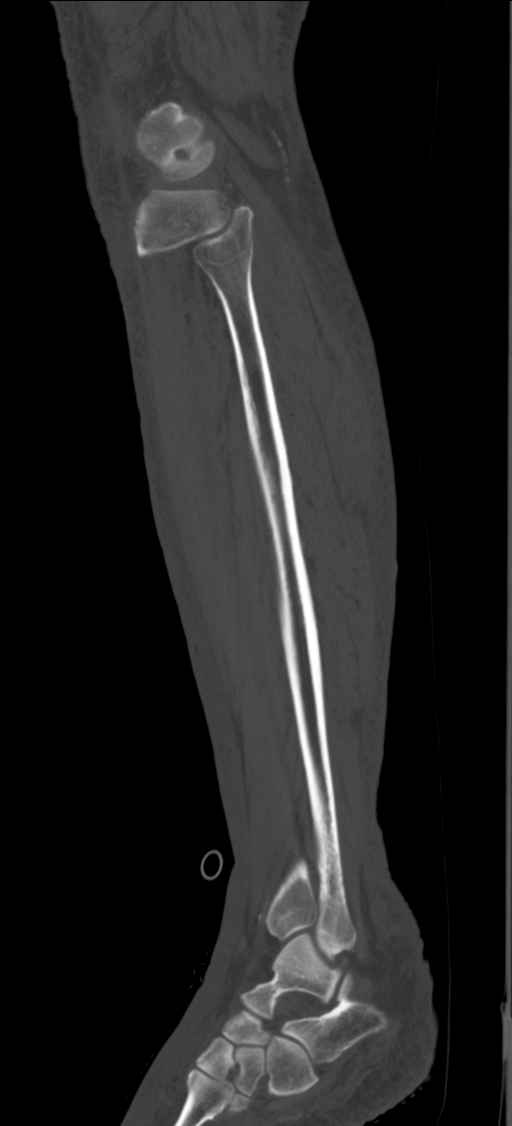
[im 70/167  bone]
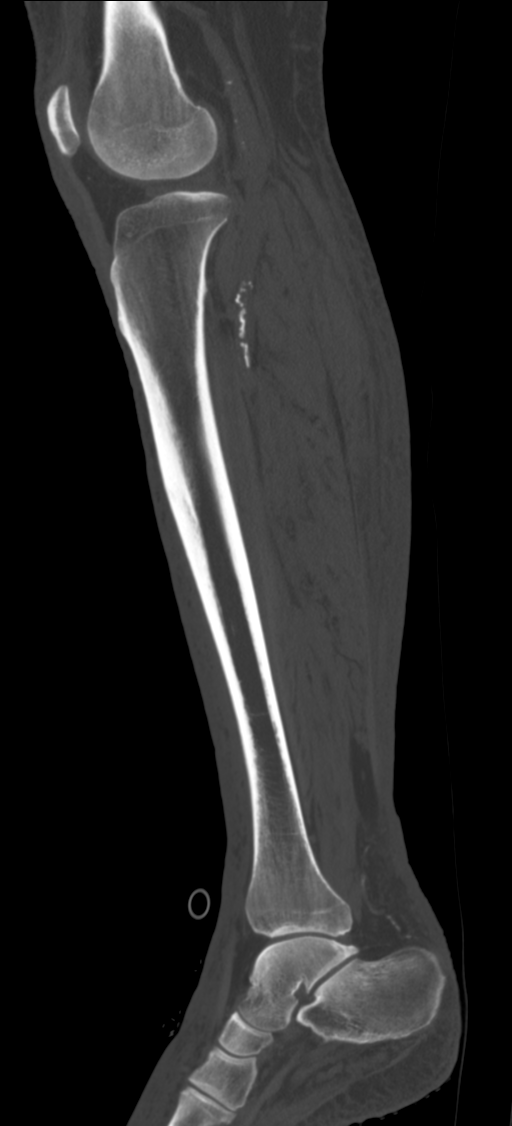
[im 84/167  bone]
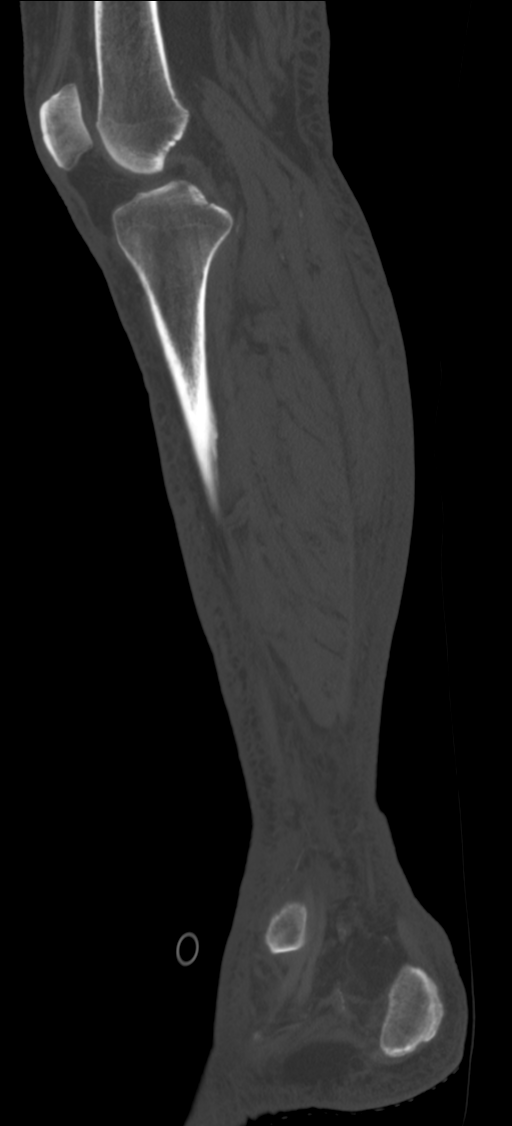
[im 97/167  bone]
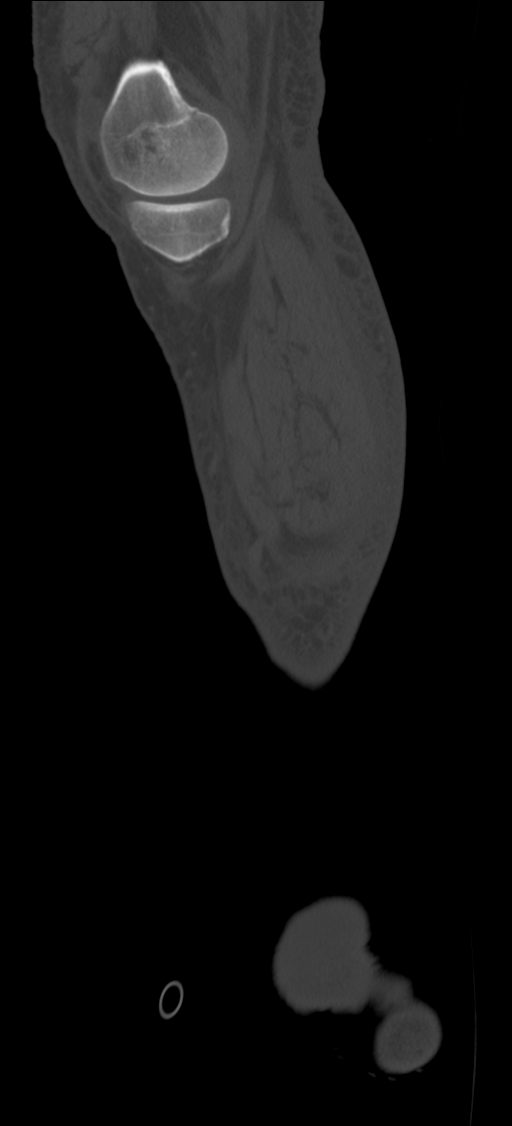
[im 111/167  bone]
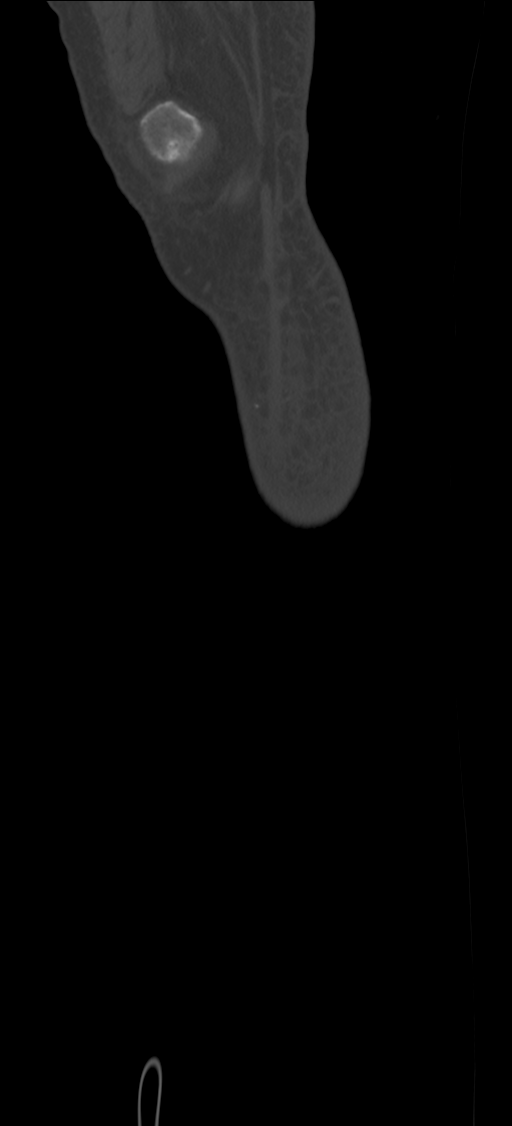

[9 of 33 positions shown; findings below may reference images not displayed]

FINDINGS: Bones/Joint/Cartilage

No fracture or dislocation. Normal alignment. No joint effusion.
Round 9 mm sclerotic lesion in the distal femoral metaphysis,
nonspecific.

Ligaments

Ligaments are suboptimally evaluated by CT.

Muscles and Tendons
Grossly intact.  No muscle atrophy.

Soft tissue
Severe circumferential soft tissue swelling and skin thickening of
the lower leg extending into the foot. No fluid collection or
subcutaneous emphysema. No soft tissue mass.
IMPRESSION: 1. Severe circumferential soft tissue swelling and skin thickening
of the lower leg extending into the foot, consistent with reported
history of cellulitis. No abscess or subcutaneous emphysema.
2. Round 9 mm sclerotic lesion in the distal femoral metaphysis,
nonspecific. In the absence of known malignancy, this is likely a
benign bone island.
